# Patient Record
Sex: Female | Born: 1980 | Race: White | Hispanic: No | Smoking: Former smoker
Health system: Southern US, Community
[De-identification: ages and names within clinical notes are randomized; demographics above are authoritative.]

## PROBLEM LIST (undated history)

## (undated) DIAGNOSIS — F319 Bipolar disorder, unspecified: Secondary | ICD-10-CM

## (undated) DIAGNOSIS — T7840XA Allergy, unspecified, initial encounter: Secondary | ICD-10-CM

## (undated) DIAGNOSIS — I209 Angina pectoris, unspecified: Secondary | ICD-10-CM

## (undated) DIAGNOSIS — R0600 Dyspnea, unspecified: Secondary | ICD-10-CM

## (undated) DIAGNOSIS — I499 Cardiac arrhythmia, unspecified: Secondary | ICD-10-CM

## (undated) DIAGNOSIS — F410 Panic disorder [episodic paroxysmal anxiety] without agoraphobia: Secondary | ICD-10-CM

## (undated) DIAGNOSIS — F419 Anxiety disorder, unspecified: Secondary | ICD-10-CM

## (undated) DIAGNOSIS — I10 Essential (primary) hypertension: Secondary | ICD-10-CM

## (undated) DIAGNOSIS — F5089 Other specified eating disorder: Secondary | ICD-10-CM

## (undated) DIAGNOSIS — G43909 Migraine, unspecified, not intractable, without status migrainosus: Secondary | ICD-10-CM

## (undated) DIAGNOSIS — M1990 Unspecified osteoarthritis, unspecified site: Secondary | ICD-10-CM

## (undated) DIAGNOSIS — M5126 Other intervertebral disc displacement, lumbar region: Secondary | ICD-10-CM

## (undated) DIAGNOSIS — F32A Depression, unspecified: Secondary | ICD-10-CM

## (undated) DIAGNOSIS — K219 Gastro-esophageal reflux disease without esophagitis: Secondary | ICD-10-CM

## (undated) DIAGNOSIS — D649 Anemia, unspecified: Secondary | ICD-10-CM

## (undated) HISTORY — DX: Unspecified osteoarthritis, unspecified site: M19.90

## (undated) HISTORY — DX: Allergy, unspecified, initial encounter: T78.40XA

## (undated) HISTORY — DX: Depression, unspecified: F32.A

---

## 2004-09-20 HISTORY — PX: COLPOSCOPY: SHX161

## 2006-10-13 ENCOUNTER — Emergency Department (HOSPITAL_COMMUNITY): Admission: EM | Admit: 2006-10-13 | Discharge: 2006-10-14 | Payer: Self-pay | Admitting: Emergency Medicine

## 2006-10-21 ENCOUNTER — Emergency Department (HOSPITAL_COMMUNITY): Admission: EM | Admit: 2006-10-21 | Discharge: 2006-10-21 | Payer: Self-pay | Admitting: Emergency Medicine

## 2007-09-27 ENCOUNTER — Emergency Department (HOSPITAL_COMMUNITY): Admission: EM | Admit: 2007-09-27 | Discharge: 2007-09-27 | Payer: Self-pay | Admitting: Emergency Medicine

## 2009-01-08 ENCOUNTER — Emergency Department: Payer: Self-pay | Admitting: Emergency Medicine

## 2009-11-06 ENCOUNTER — Emergency Department (HOSPITAL_COMMUNITY): Admission: EM | Admit: 2009-11-06 | Discharge: 2009-11-06 | Payer: Self-pay | Admitting: Emergency Medicine

## 2010-01-04 ENCOUNTER — Emergency Department (HOSPITAL_COMMUNITY): Admission: EM | Admit: 2010-01-04 | Discharge: 2010-01-04 | Payer: Self-pay | Admitting: Emergency Medicine

## 2010-08-20 ENCOUNTER — Emergency Department: Payer: Self-pay | Admitting: Emergency Medicine

## 2010-12-08 LAB — URINALYSIS, ROUTINE W REFLEX MICROSCOPIC
Bilirubin Urine: NEGATIVE
Ketones, ur: NEGATIVE mg/dL
Specific Gravity, Urine: 1.017 (ref 1.005–1.030)
pH: 7.5 (ref 5.0–8.0)

## 2010-12-08 LAB — URINE MICROSCOPIC-ADD ON

## 2011-11-09 ENCOUNTER — Emergency Department (HOSPITAL_COMMUNITY)
Admission: EM | Admit: 2011-11-09 | Discharge: 2011-11-09 | Disposition: A | Discharge status: Home / Self Care | Payer: Self-pay | Attending: Emergency Medicine | Admitting: Emergency Medicine

## 2011-11-09 ENCOUNTER — Encounter (HOSPITAL_COMMUNITY): Payer: Self-pay | Admitting: *Deleted

## 2011-11-09 DIAGNOSIS — S39012A Strain of muscle, fascia and tendon of lower back, initial encounter: Secondary | ICD-10-CM

## 2011-11-09 DIAGNOSIS — X58XXXA Exposure to other specified factors, initial encounter: Secondary | ICD-10-CM | POA: Insufficient documentation

## 2011-11-09 DIAGNOSIS — S339XXA Sprain of unspecified parts of lumbar spine and pelvis, initial encounter: Secondary | ICD-10-CM | POA: Insufficient documentation

## 2011-11-09 DIAGNOSIS — Y9383 Activity, rough housing and horseplay: Secondary | ICD-10-CM | POA: Insufficient documentation

## 2011-11-09 DIAGNOSIS — Y9229 Other specified public building as the place of occurrence of the external cause: Secondary | ICD-10-CM | POA: Insufficient documentation

## 2011-11-09 MED ORDER — CYCLOBENZAPRINE HCL 5 MG PO TABS: 5.0000 mg | ORAL_TABLET | Freq: Three times a day (TID) | ORAL | Status: AC | PRN

## 2011-11-09 NOTE — Discharge Instructions (Signed)
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
## 2011-11-13 NOTE — ED Provider Notes (Signed)
Medical screening examination/treatment/procedure(s) were performed by non-physician practitioner and as supervising physician I was immediately available for consultation/collaboration.   Kash Davie A. Stran Raper, MD 11/13/11 0655 

## 2011-11-20 ENCOUNTER — Encounter (HOSPITAL_COMMUNITY): Payer: Self-pay | Admitting: *Deleted

## 2011-11-20 ENCOUNTER — Emergency Department (HOSPITAL_COMMUNITY)
Admission: EM | Admit: 2011-11-20 | Discharge: 2011-11-21 | Disposition: A | Discharge status: Home / Self Care | Payer: Self-pay | Attending: Emergency Medicine | Admitting: Emergency Medicine

## 2011-11-20 DIAGNOSIS — Z79899 Other long term (current) drug therapy: Secondary | ICD-10-CM | POA: Insufficient documentation

## 2011-11-20 DIAGNOSIS — IMO0001 Reserved for inherently not codable concepts without codable children: Secondary | ICD-10-CM | POA: Insufficient documentation

## 2011-11-20 DIAGNOSIS — IMO0002 Reserved for concepts with insufficient information to code with codable children: Secondary | ICD-10-CM | POA: Insufficient documentation

## 2011-11-20 DIAGNOSIS — F319 Bipolar disorder, unspecified: Secondary | ICD-10-CM | POA: Insufficient documentation

## 2011-11-20 DIAGNOSIS — M2550 Pain in unspecified joint: Secondary | ICD-10-CM | POA: Insufficient documentation

## 2011-11-20 DIAGNOSIS — F411 Generalized anxiety disorder: Secondary | ICD-10-CM | POA: Insufficient documentation

## 2011-11-20 HISTORY — DX: Anxiety disorder, unspecified: F41.9

## 2011-11-20 HISTORY — DX: Panic disorder (episodic paroxysmal anxiety): F41.0

## 2011-11-20 HISTORY — DX: Migraine, unspecified, not intractable, without status migrainosus: G43.909

## 2011-11-20 HISTORY — DX: Bipolar disorder, unspecified: F31.9

## 2011-11-20 LAB — CBC
HCT: 39.2 % (ref 36.0–46.0)
MCH: 22 pg — ABNORMAL LOW (ref 26.0–34.0)
MCV: 69.1 fL — ABNORMAL LOW (ref 78.0–100.0)
Platelets: 343 10*3/uL (ref 150–400)
RDW: 14.8 % (ref 11.5–15.5)

## 2011-11-20 LAB — COMPREHENSIVE METABOLIC PANEL
Alkaline Phosphatase: 75 U/L (ref 39–117)
BUN: 15 mg/dL (ref 6–23)
Calcium: 9.3 mg/dL (ref 8.4–10.5)
GFR calc Af Amer: 76 mL/min — ABNORMAL LOW (ref >90)
Sodium: 137 mEq/L (ref 135–145)

## 2011-11-20 LAB — RAPID URINE DRUG SCREEN, HOSP PERFORMED
Barbiturates: NOT DETECTED
Cocaine: NOT DETECTED
Opiates: NOT DETECTED
Tetrahydrocannabinol: POSITIVE — AB

## 2011-11-20 LAB — ACETAMINOPHEN LEVEL: Acetaminophen (Tylenol), Serum: <15 ug/mL (ref 10–30)

## 2011-11-20 LAB — ETHANOL: Alcohol, Ethyl (B): <11 mg/dL (ref 0–11)

## 2011-11-20 MED ORDER — ACETAMINOPHEN 325 MG PO TABS: 650.0000 mg | ORAL_TABLET | ORAL | Status: DC | PRN

## 2011-11-20 MED ORDER — ONDANSETRON HCL 4 MG PO TABS: 4.0000 mg | ORAL_TABLET | Freq: Three times a day (TID) | ORAL | Status: DC | PRN

## 2011-11-20 MED ORDER — LORAZEPAM 1 MG PO TABS
2.0000 mg | ORAL_TABLET | Freq: Once | ORAL | Status: AC
  Administered 2011-11-20: 2 mg via ORAL
  Filled 2011-11-20: qty 2
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
## 2011-11-20 NOTE — ED Notes (Signed)
2 bags locked in locker 
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
## 2011-11-21 NOTE — ED Provider Notes (Signed)
Patient presented tonight with history of bipolar disorder and has been having uncontrollable outbursts of anger that she feels is due to her recent increase in Prozac. She has been evaluated by Dr. Jacky Kindle tele-psych consult and he feels patient can be discharged. He recommends that she discussed her dose of Prozac with her psychiatrist. He also advised she continues on her other medications.  Patient is alert and cooperative at this point she states she is ready to go home. She states she feels comfortable and assures me she's not to do anything to herself or anybody else.  Diagnoses that have been ruled out:  None  Diagnoses that are still under consideration:  None  Final diagnoses:  Bipolar disorder   Plan discharge    Devoria Albe, MD, Franz Dell, MD 11/21/11 [phone]

## 2011-11-21 NOTE — BH Assessment (Signed)
Per telepsych pt to be discharged. RN and EDP notified.

## 2011-11-21 NOTE — Discharge Instructions (Signed)
Continue your medication, Dr Jacky Kindle the psychiatrist that saw you wants you to discuss the dose of your prozac with your psychiatrist. Return to the ED if you feel you may hurt yourself or someone else.

## 2012-06-21 DIAGNOSIS — Z87410 Personal history of cervical dysplasia: Secondary | ICD-10-CM | POA: Insufficient documentation

## 2012-12-10 ENCOUNTER — Emergency Department: Payer: Self-pay | Admitting: Emergency Medicine

## 2014-02-23 ENCOUNTER — Emergency Department: Payer: Self-pay | Admitting: Emergency Medicine

## 2014-02-23 LAB — COMPREHENSIVE METABOLIC PANEL
ANION GAP: 7 (ref 7–16)
Albumin: 3.4 g/dL (ref 3.4–5.0)
Alkaline Phosphatase: 68 U/L
BILIRUBIN TOTAL: 0.2 mg/dL (ref 0.2–1.0)
BUN: 11 mg/dL (ref 7–18)
CALCIUM: 8.7 mg/dL (ref 8.5–10.1)
CO2: 23 mmol/L (ref 21–32)
Chloride: 108 mmol/L — ABNORMAL HIGH (ref 98–107)
Creatinine: 0.87 mg/dL (ref 0.60–1.30)
EGFR (African American): >60
Glucose: 100 mg/dL — ABNORMAL HIGH (ref 65–99)
Osmolality: 275 (ref 275–301)
POTASSIUM: 3.5 mmol/L (ref 3.5–5.1)
SGOT(AST): 17 U/L (ref 15–37)
SGPT (ALT): 16 U/L (ref 12–78)
Sodium: 138 mmol/L (ref 136–145)
TOTAL PROTEIN: 7.2 g/dL (ref 6.4–8.2)

## 2014-02-23 LAB — TROPONIN I: Troponin-I: <0.02 ng/mL

## 2014-02-23 LAB — CBC
HCT: 35.9 % (ref 35.0–47.0)
HGB: 11 g/dL — AB (ref 12.0–16.0)
MCH: 21.2 pg — ABNORMAL LOW (ref 26.0–34.0)
MCHC: 30.6 g/dL — ABNORMAL LOW (ref 32.0–36.0)
MCV: 69 fL — AB (ref 80–100)
Platelet: 364 10*3/uL (ref 150–440)
RBC: 5.19 10*6/uL (ref 3.80–5.20)
RDW: 15.2 % — AB (ref 11.5–14.5)
WBC: 11.6 10*3/uL — AB (ref 3.6–11.0)

## 2014-02-23 LAB — CK TOTAL AND CKMB (NOT AT ARMC)
CK, Total: 128 U/L
CK-MB: 1 ng/mL (ref 0.5–3.6)

## 2014-05-10 ENCOUNTER — Emergency Department: Payer: Self-pay | Admitting: Emergency Medicine

## 2014-05-10 LAB — URINALYSIS, COMPLETE
BILIRUBIN, UR: NEGATIVE
Bacteria: NONE SEEN
GLUCOSE, UR: NEGATIVE mg/dL (ref 0–75)
Hyaline Cast: 2
KETONE: NEGATIVE
Nitrite: NEGATIVE
PH: 5 (ref 4.5–8.0)
RBC,UR: 479 /HPF (ref 0–5)
SPECIFIC GRAVITY: 1.026 (ref 1.003–1.030)

## 2014-05-10 LAB — COMPREHENSIVE METABOLIC PANEL
ALK PHOS: 68 U/L
AST: 23 U/L (ref 15–37)
Albumin: 3.6 g/dL (ref 3.4–5.0)
Anion Gap: 9 (ref 7–16)
BILIRUBIN TOTAL: 0.3 mg/dL (ref 0.2–1.0)
BUN: 14 mg/dL (ref 7–18)
CALCIUM: 8.8 mg/dL (ref 8.5–10.1)
Chloride: 108 mmol/L — ABNORMAL HIGH (ref 98–107)
Co2: 23 mmol/L (ref 21–32)
Creatinine: 1.1 mg/dL (ref 0.60–1.30)
GLUCOSE: 109 mg/dL — AB (ref 65–99)
Osmolality: 280 (ref 275–301)
Potassium: 3.1 mmol/L — ABNORMAL LOW (ref 3.5–5.1)
SGPT (ALT): 16 U/L
SODIUM: 140 mmol/L (ref 136–145)
Total Protein: 7.8 g/dL (ref 6.4–8.2)

## 2014-05-10 LAB — CBC
HCT: 36.2 % (ref 35.0–47.0)
HGB: 11.1 g/dL — AB (ref 12.0–16.0)
MCH: 21.5 pg — AB (ref 26.0–34.0)
MCHC: 30.6 g/dL — AB (ref 32.0–36.0)
MCV: 70 fL — ABNORMAL LOW (ref 80–100)
Platelet: 395 10*3/uL (ref 150–440)
RBC: 5.17 10*6/uL (ref 3.80–5.20)
RDW: 14.5 % (ref 11.5–14.5)
WBC: 13.4 10*3/uL — AB (ref 3.6–11.0)

## 2014-05-16 ENCOUNTER — Emergency Department: Payer: Self-pay | Admitting: Emergency Medicine

## 2014-06-27 ENCOUNTER — Emergency Department: Payer: Self-pay | Admitting: Emergency Medicine

## 2014-07-09 ENCOUNTER — Emergency Department: Payer: Self-pay | Admitting: Emergency Medicine

## 2014-09-10 ENCOUNTER — Ambulatory Visit: Payer: Self-pay | Admitting: Family Medicine

## 2014-09-20 ENCOUNTER — Emergency Department: Payer: Self-pay | Admitting: Emergency Medicine

## 2014-09-30 DIAGNOSIS — F3181 Bipolar II disorder: Secondary | ICD-10-CM | POA: Insufficient documentation

## 2015-02-24 DIAGNOSIS — Z3490 Encounter for supervision of normal pregnancy, unspecified, unspecified trimester: Secondary | ICD-10-CM | POA: Insufficient documentation

## 2015-03-04 DIAGNOSIS — O9081 Anemia of the puerperium: Secondary | ICD-10-CM | POA: Insufficient documentation

## 2015-03-13 DIAGNOSIS — F1290 Cannabis use, unspecified, uncomplicated: Secondary | ICD-10-CM | POA: Insufficient documentation

## 2015-05-21 DIAGNOSIS — R87612 Low grade squamous intraepithelial lesion on cytologic smear of cervix (LGSIL): Secondary | ICD-10-CM | POA: Insufficient documentation

## 2015-12-30 ENCOUNTER — Emergency Department
Admission: EM | Admit: 2015-12-30 | Discharge: 2015-12-30 | Disposition: A | Discharge status: Home / Self Care | Payer: Medicaid Other | Attending: Emergency Medicine | Admitting: Emergency Medicine

## 2015-12-30 ENCOUNTER — Encounter: Payer: Self-pay | Admitting: Medical Oncology

## 2015-12-30 ENCOUNTER — Emergency Department: Payer: Medicaid Other

## 2015-12-30 DIAGNOSIS — M25562 Pain in left knee: Secondary | ICD-10-CM | POA: Diagnosis present

## 2015-12-30 DIAGNOSIS — F410 Panic disorder [episodic paroxysmal anxiety] without agoraphobia: Secondary | ICD-10-CM | POA: Insufficient documentation

## 2015-12-30 DIAGNOSIS — F1099 Alcohol use, unspecified with unspecified alcohol-induced disorder: Secondary | ICD-10-CM | POA: Insufficient documentation

## 2015-12-30 DIAGNOSIS — G43909 Migraine, unspecified, not intractable, without status migrainosus: Secondary | ICD-10-CM | POA: Diagnosis not present

## 2015-12-30 DIAGNOSIS — F1490 Cocaine use, unspecified, uncomplicated: Secondary | ICD-10-CM | POA: Insufficient documentation

## 2015-12-30 DIAGNOSIS — F418 Other specified anxiety disorders: Secondary | ICD-10-CM | POA: Insufficient documentation

## 2015-12-30 DIAGNOSIS — Z79899 Other long term (current) drug therapy: Secondary | ICD-10-CM | POA: Diagnosis not present

## 2015-12-30 MED ORDER — KETOROLAC TROMETHAMINE 60 MG/2ML IM SOLN
60.0000 mg | Freq: Once | INTRAMUSCULAR | Status: AC
  Administered 2015-12-30: 60 mg via INTRAMUSCULAR
  Filled 2015-12-30: qty 2

## 2015-12-30 MED ORDER — HYDROCODONE-ACETAMINOPHEN 5-325 MG PO TABS: 1.0000 | ORAL_TABLET | ORAL | Status: DC | PRN

## 2015-12-30 MED ORDER — NAPROXEN 500 MG PO TABS: 500.0000 mg | ORAL_TABLET | Freq: Two times a day (BID) | ORAL | Status: DC

## 2015-12-30 NOTE — ED Notes (Signed)
Pt reports left knee pain x 1 month.

## 2015-12-30 NOTE — Discharge Instructions (Signed)
Heat Therapy Heat therapy can help ease sore, stiff, injured, and tight muscles and joints. Heat relaxes your muscles, which may help ease your pain.  RISKS AND COMPLICATIONS If you have any of the following conditions, do not use heat therapy unless your health care provider has approved:  Poor circulation.  Healing wounds or scarred skin in the area being treated.  Diabetes, heart disease, or high blood pressure.  Not being able to feel (numbness) the area being treated.  Unusual swelling of the area being treated.  Active infections.  Blood clots.  Cancer.  Inability to communicate pain. This may include young children and people who have problems with their brain function (dementia).  Pregnancy. Heat therapy should only be used on old, pre-existing, or long-lasting (chronic) injuries. Do not use heat therapy on new injuries unless directed by your health care provider. HOW TO USE HEAT THERAPY There are several different kinds of heat therapy, including:  Moist heat pack.  Warm water bath.  Hot water bottle.  Electric heating pad.  Heated gel pack.  Heated wrap.  Electric heating pad. Use the heat therapy method suggested by your health care provider. Follow your health care provider's instructions on when and how to use heat therapy. GENERAL HEAT THERAPY RECOMMENDATIONS  Do not sleep while using heat therapy. Only use heat therapy while you are awake.  Your skin may turn pink while using heat therapy. Do not use heat therapy if your skin turns red.  Do not use heat therapy if you have new pain.  High heat or long exposure to heat can cause burns. Be careful when using heat therapy to avoid burning your skin.  Do not use heat therapy on areas of your skin that are already irritated, such as with a rash or sunburn. SEEK MEDICAL CARE IF:  You have blisters, redness, swelling, or numbness.  You have new pain.  Your pain is worse. MAKE SURE  YOU:  Understand these instructions.  Will watch your condition.  Will get help right away if you are not doing well or get worse.   This information is not intended to replace advice given to you by your health care provider. Make sure you discuss any questions you have with your health care provider.   Document Released: 11/29/2011 Document Revised: 09/27/2014 Document Reviewed: 10/30/2013 Elsevier Interactive Patient Education 2016 [place] [employer] Joint pain, which is also called arthralgia, can be caused by many things. Joint pain often goes away when you follow your health care provider's instructions for relieving pain at home. However, joint pain can also be caused by conditions that require further treatment. Common causes of joint pain include:  Bruising in the area of the joint.  Overuse of the joint.  Wear and tear on the joints that occur with aging (osteoarthritis).  Various other forms of arthritis.  A buildup of a crystal form of uric acid in the joint (gout).  Infections of the joint (septic arthritis) or of the bone (osteomyelitis). Your health care provider may recommend medicine to help with the pain. If your joint pain continues, additional tests may be needed to diagnose your condition. HOME CARE INSTRUCTIONS Watch your condition for any changes. Follow these instructions as directed to lessen the pain that you are feeling.  Take medicines only as directed by your health care provider.  Rest the affected area for as long as your health care provider says that you should. If directed to do so, raise  the painful joint above the level of your heart while you are sitting or lying down.  Do not do things that cause or worsen pain.  If directed, apply ice to the painful area:  Put ice in a plastic bag.  Place a towel between your skin and the bag.  Leave the ice on for 20 minutes, 2-3 times per day.  Wear an elastic bandage, splint, or sling as  directed by your health care provider. Loosen the elastic bandage or splint if your fingers or toes become numb and tingle, or if they turn cold and blue.  Begin exercising or stretching the affected area as directed by your health care provider. Ask your health care provider what types of exercise are safe for you.  Keep all follow-up visits as directed by your health care provider. This is important. SEEK MEDICAL CARE IF:  Your pain increases, and medicine does not help.  Your joint pain does not improve within 3 days.  You have increased bruising or swelling.  You have a fever.  You lose 10 lb (4.5 kg) or more without trying. SEEK IMMEDIATE MEDICAL CARE IF:  You are not able to move the joint.  Your fingers or toes become numb or they turn cold and blue.   This information is not intended to replace advice given to you by your health care provider. Make sure you discuss any questions you have with your health care provider.   Document Released: 09/06/2005 Document Revised: 09/27/2014 Document Reviewed: 06/18/2014 Elsevier Interactive Patient Education 2016 Else[employer]  Knee Pain Knee pain is a very common symptom and can have many causes. Knee pain often goes away when you follow your health care provider's instructions for relieving pain and discomfort at home. However, knee pain can develop into a condition that needs treatment. Some conditions may include:  Arthritis caused by wear and tear (osteoarthritis).  Arthritis caused by swelling and irritation (rheumatoid arthritis or gout).  A cyst or growth in your knee.  An infection in your knee joint.  An injury that will not heal.  Damage, swelling, or irritation of the tissues that support your knee (torn ligaments or tendinitis). If your knee pain continues, additional tests may be ordered to diagnose your condition. Tests may include X-rays or other imaging studies of your knee. You may also need to have fluid  removed from your knee. Treatment for ongoing knee pain depends on the cause, but treatment may include:  Medicines to relieve pain or swelling.  Steroid injections in your knee.  Physical therapy.  Surgery. HOME CARE INSTRUCTIONS  Take medicines only as directed by your health care provider.  Rest your knee and keep it raised (elevated) while you are resting.  Do not do things that cause or worsen pain.  Avoid high-impact activities or exercises, such as running, jumping rope, or doing jumping jacks.  Apply ice to the knee area:  Put ice in a plastic bag.  Place a towel between your skin and the bag.  Leave the ice on for 20 minutes, 2-3 times a day.  Ask your health care provider if you should wear an elastic knee support.  Keep a pillow under your knee when you sleep.  Lose weight if you are overweight. Extra weight can put pressure on your knee.  Do not use any tobacco products, including cigarettes, chewing tobacco, or electronic cigarettes. If you need help quitting, ask your health care provider. Smoking may slow the healing of any  bone and joint problems that you may have. SEEK MEDICAL CARE IF:  Your knee pain continues, changes, or gets worse.  You have a fever along with knee pain.  Your knee buckles or locks up.  Your knee becomes more swollen. SEEK IMMEDIATE MEDICAL CARE IF:   Your knee joint feels hot to the touch.  You have chest pain or trouble breathing.   This information is not intended to replace advice given to you by your health care provider. Make sure you discuss any questions you have with your health care provider.   Document Released: 07/04/2007 Document Revised: 09/27/2014 Document Reviewed: 04/22/2014 Elsevier Interactive Patient Education [employer].

## 2015-12-30 NOTE — ED Notes (Signed)
Left knee pain for about 1 month   Unsure of injury

## 2015-12-30 NOTE — ED Provider Notes (Signed)
[facility] Emergency Department Provider Note  ____________________________________________  Time seen: Approximately 2:01 PM  I have reviewed the triage vital signs and the nursing notes.   HISTORY  Chief Complaint Knee Pain    HPI Dominique Dyer is a 35 y.o. female presents for evaluation of left knee pain 1 month. Unsure of injury. Patient states over the last couple days the pain is progressively gotten worsen. Patient reports trying to get out of the car pretty weight on her knee causes increased pain. Husband states that she cries out during her sleep because of the pain. Feel like someone is stabbing her in the upper part of her knee. Rates her pain as an 8/10 nonradiating at this time.   Past Medical History  Diagnosis Date  . Bipolar disorder ([place])   . Panic attacks   . Migraines   . Anxiety     There are no active problems to display for this patient.   History reviewed. No pertinent past surgical history.  Current Outpatient Rx  Name  Route  Sig  Dispense  Refill  . cetirizine (ZYRTEC) 10 MG tablet   Oral   Take 10 mg by mouth daily.         Marland Kitchen FLUoxetine (PROZAC) 20 MG capsule   Oral   Take 40 mg by mouth daily.         Marland Kitchen HYDROcodone-acetaminophen (NORCO) 5-325 MG tablet   Oral   Take 1-2 tablets by mouth every 4 (four) hours as needed for moderate pain.   15 tablet   0   . lamoTRIgine (LAMICTAL) 100 MG tablet   Oral   Take 100 mg by mouth daily.         . naproxen (NAPROSYN) 500 MG tablet   Oral   Take 1 tablet (500 mg total) by mouth 2 (two) times daily with a meal.   60 tablet   0   . propranolol (INDERAL) 40 MG tablet   Oral   Take 40 mg by mouth 2 (two) times daily.         . ranitidine (ZANTAC) 150 MG tablet   Oral   Take 150 mg by mouth 2 (two) times daily.         Marland Kitchen topiramate (TOPAMAX) 25 MG tablet   Oral   Take 25 mg by mouth 2 (two) times daily.         Marland Kitchen zolpidem (AMBIEN) 10 MG  tablet   Oral   Take 10 mg by mouth at bedtime as needed. For anxiety           Allergies Review of patient's allergies indicates no known allergies.  No family history on file.  Social History Social History  Substance Use Topics  . Smoking status: Never Smoker   . Smokeless tobacco: None  . Alcohol Use: Yes     Comment: occa, but recently been drinking Bailey's     Review of Systems Cardiovascular: Denies chest pain. Respiratory: Denies shortness of breath. Musculoskeletal: Positive for left knee pain. Skin: Negative for rash. Neurological: Negative for headaches, focal weakness or numbness.  10-point ROS otherwise negative.  ____________________________________________   PHYSICAL EXAM: BP 137/79 mmHg  Pulse 89  Temp(Src) 98.4 F (36.9 C) (Oral)  Resp 15  Ht 5\' 4"  (1.626 m)  Wt 106.595 kg  BMI 40.32 kg/m2  SpO2 100%  LMP 12/16/2015 (Within Weeks)  VITAL SIGNS: ED Triage Vitals  Enc Vitals Group  BP --      Pulse --      Resp --      Temp --      Temp src --      SpO2 --      Weight --      Height --      Head Cir --      Peak Flow --      Pain Score 12/30/15 1353 8     Pain Loc --      Pain Edu? --      Excl. in [place]? --     Constitutional: Alert and oriented. Well appearing and in no acute distress.   Cardiovascular: Normal rate, regular rhythm. Grossly normal heart sounds.  Good peripheral circulation. Respiratory: Normal respiratory effort.  No retractions. Lungs CTAB. Musculoskeletal: Left knee pain. No ecchymosis or bruising noted. Limited range of motion with flexion. Point tenderness noted superiorly and medially. No obvious evidence of effusion noted. Neurologic:  Normal speech and language. No gross focal neurologic deficits are appreciated. No gait instability. Skin:  Skin is warm, dry and intact. No rash noted. Psychiatric: Mood and affect are normal. Speech and behavior are normal.  ____________________________________________    LABS (all labs ordered are listed, but only abnormal results are displayed)  Labs Reviewed - No data to display   RADIOLOGY  Small left knee effusion. No acute osseous findings. ____________________________________________   PROCEDURES  Procedure(s) performed: None  Critical Care performed: No  ____________________________________________   INITIAL IMPRESSION / ASSESSMENT AND PLAN / ED COURSE  Pertinent labs & imaging results that were available during my care of the patient were reviewed by me and considered in my medical decision making (see chart for details).  Acute left knee pain. Small joint effusion noted. Patient follow-up with Dr. Roland Rack or return to the ER as needed. Patient given a prescription for Norco 5/325 and naproxen 500 mg. Patient encouraged to stay off for the next 24 and elevate her knee. She denies any other emergency medical complaints at this time. ____________________________________________   FINAL CLINICAL IMPRESSION(S) / ED DIAGNOSES  Final diagnoses:  Knee pain, acute, left     This chart was dictated using voice recognition software/Dragon. Despite best efforts to proofread, errors can occur which can change the meaning. Any change was purely unintentional.   Arlyss Repress, PA-C 12/30/15 1758  Lavonia Drafts, MD 01/02/16 [phone]

## 2016-02-01 DIAGNOSIS — R519 Headache, unspecified: Secondary | ICD-10-CM | POA: Insufficient documentation

## 2017-01-18 ENCOUNTER — Other Ambulatory Visit: Payer: Self-pay | Admitting: Nurse Practitioner

## 2017-01-18 DIAGNOSIS — G43719 Chronic migraine without aura, intractable, without status migrainosus: Secondary | ICD-10-CM

## 2017-01-21 DIAGNOSIS — G43719 Chronic migraine without aura, intractable, without status migrainosus: Secondary | ICD-10-CM | POA: Insufficient documentation

## 2017-02-02 ENCOUNTER — Ambulatory Visit: Payer: Medicaid Other

## 2017-02-09 ENCOUNTER — Ambulatory Visit
Admission: RE | Admit: 2017-02-09 | Discharge: 2017-02-09 | Disposition: A | Discharge status: Home / Self Care | Payer: Medicaid Other | Source: Ambulatory Visit | Attending: Nurse Practitioner | Admitting: Nurse Practitioner

## 2017-02-09 DIAGNOSIS — G43719 Chronic migraine without aura, intractable, without status migrainosus: Secondary | ICD-10-CM | POA: Diagnosis present

## 2017-02-09 MED ORDER — GADOBENATE DIMEGLUMINE 529 MG/ML IV SOLN
20.0000 mL | Freq: Once | INTRAVENOUS | Status: AC | PRN
  Administered 2017-02-09: 20 mL via INTRAVENOUS

## 2017-05-18 ENCOUNTER — Ambulatory Visit: Payer: Medicaid Other | Attending: Neurology

## 2017-06-17 ENCOUNTER — Ambulatory Visit: Payer: Medicaid Other | Attending: Neurology

## 2017-06-20 ENCOUNTER — Encounter: Payer: Self-pay | Admitting: Emergency Medicine

## 2017-06-20 DIAGNOSIS — R1031 Right lower quadrant pain: Secondary | ICD-10-CM | POA: Diagnosis present

## 2017-06-20 DIAGNOSIS — Z79899 Other long term (current) drug therapy: Secondary | ICD-10-CM | POA: Diagnosis not present

## 2017-06-20 LAB — COMPREHENSIVE METABOLIC PANEL
ALT: 12 U/L — AB (ref 14–54)
AST: 14 U/L — ABNORMAL LOW (ref 15–41)
Albumin: 4 g/dL (ref 3.5–5.0)
Alkaline Phosphatase: 64 U/L (ref 38–126)
Anion gap: 7 (ref 5–15)
BUN: 8 mg/dL (ref 6–20)
CO2: 25 mmol/L (ref 22–32)
CREATININE: 0.86 mg/dL (ref 0.44–1.00)
Calcium: 8.7 mg/dL — ABNORMAL LOW (ref 8.9–10.3)
Chloride: 106 mmol/L (ref 101–111)
Glucose, Bld: 100 mg/dL — ABNORMAL HIGH (ref 65–99)
Potassium: 3.7 mmol/L (ref 3.5–5.1)
Sodium: 138 mmol/L (ref 135–145)
Total Bilirubin: 0.2 mg/dL — ABNORMAL LOW (ref 0.3–1.2)
Total Protein: 7.3 g/dL (ref 6.5–8.1)

## 2017-06-20 LAB — URINALYSIS, COMPLETE (UACMP) WITH MICROSCOPIC
BILIRUBIN URINE: NEGATIVE
Glucose, UA: NEGATIVE mg/dL
Ketones, ur: NEGATIVE mg/dL
LEUKOCYTES UA: NEGATIVE
Nitrite: NEGATIVE
PROTEIN: NEGATIVE mg/dL
SPECIFIC GRAVITY, URINE: 1.005 (ref 1.005–1.030)
pH: 6 (ref 5.0–8.0)

## 2017-06-20 LAB — CBC
HCT: 31.4 % — ABNORMAL LOW (ref 35.0–47.0)
Hemoglobin: 9.7 g/dL — ABNORMAL LOW (ref 12.0–16.0)
MCH: 20.2 pg — ABNORMAL LOW (ref 26.0–34.0)
MCHC: 31 g/dL — ABNORMAL LOW (ref 32.0–36.0)
MCV: 65.2 fL — ABNORMAL LOW (ref 80.0–100.0)
PLATELETS: 383 10*3/uL (ref 150–440)
RBC: 4.81 MIL/uL (ref 3.80–5.20)
RDW: 17.1 % — ABNORMAL HIGH (ref 11.5–14.5)
WBC: 8.6 10*3/uL (ref 3.6–11.0)

## 2017-06-20 LAB — LIPASE, BLOOD: LIPASE: 27 U/L (ref 11–51)

## 2017-06-20 LAB — POCT PREGNANCY, URINE: Preg Test, Ur: NEGATIVE

## 2017-06-20 MED ORDER — ONDANSETRON 4 MG PO TBDP
4.0000 mg | ORAL_TABLET | Freq: Once | ORAL | Status: AC | PRN
  Administered 2017-06-20: 4 mg via ORAL
  Filled 2017-06-20: qty 1

## 2017-06-20 NOTE — ED Triage Notes (Addendum)
Patient to ER for c/o RLQ abd pain/pelvic pain. Patient states she was seen on Thursday by PCP and treated for UTI. Has been on Macrobid since. States pain is now starting to go into right lower back as well. Denies any known fevers. Reports slight dysuria, but has improved since taking Macrobid. States pain worsens with movement. +Nausea.

## 2017-06-21 ENCOUNTER — Emergency Department
Admission: EM | Admit: 2017-06-21 | Discharge: 2017-06-21 | Disposition: A | Discharge status: Home / Self Care | Payer: Medicaid Other | Attending: Emergency Medicine | Admitting: Emergency Medicine

## 2017-06-21 ENCOUNTER — Encounter: Payer: Self-pay | Admitting: Radiology

## 2017-06-21 ENCOUNTER — Emergency Department: Payer: Medicaid Other

## 2017-06-21 DIAGNOSIS — R1031 Right lower quadrant pain: Secondary | ICD-10-CM

## 2017-06-21 LAB — WET PREP, GENITAL
Clue Cells Wet Prep HPF POC: NONE SEEN
SPERM: NONE SEEN
TRICH WET PREP: NONE SEEN
YEAST WET PREP: NONE SEEN

## 2017-06-21 LAB — CHLAMYDIA/NGC RT PCR (ARMC ONLY)
Chlamydia Tr: NOT DETECTED
N gonorrhoeae: NOT DETECTED

## 2017-06-21 MED ORDER — MORPHINE SULFATE (PF) 4 MG/ML IV SOLN
4.0000 mg | Freq: Once | INTRAVENOUS | Status: AC
  Administered 2017-06-21: 4 mg via INTRAVENOUS
  Filled 2017-06-21: qty 1

## 2017-06-21 MED ORDER — IOPAMIDOL (ISOVUE-300) INJECTION 61%
100.0000 mL | Freq: Once | INTRAVENOUS | Status: AC | PRN
  Administered 2017-06-21: 100 mL via INTRAVENOUS

## 2017-06-21 MED ORDER — KETOROLAC TROMETHAMINE 30 MG/ML IJ SOLN
30.0000 mg | Freq: Once | INTRAMUSCULAR | Status: AC
  Administered 2017-06-21: 30 mg via INTRAVENOUS
  Filled 2017-06-21: qty 1

## 2017-06-21 MED ORDER — IOPAMIDOL (ISOVUE-300) INJECTION 61%
30.0000 mL | Freq: Once | INTRAVENOUS | Status: AC
Start: 2017-06-21 — End: 2017-06-21
  Administered 2017-06-21: 30 mL via ORAL

## 2017-06-21 MED ORDER — ONDANSETRON HCL 4 MG/2ML IJ SOLN
4.0000 mg | Freq: Once | INTRAMUSCULAR | Status: AC
  Administered 2017-06-21: 4 mg via INTRAVENOUS
  Filled 2017-06-21: qty 2

## 2017-06-21 NOTE — ED Notes (Signed)
Pelvic exam done.  Specimen to lab  Pt tolerated well.

## 2017-06-21 NOTE — ED Notes (Signed)
Iv started  meds given.   

## 2017-06-21 NOTE — Discharge Instructions (Signed)
Your evaluation today included a CT scan and blood work which was negative. Please follow up with your primary care physician

## 2017-06-21 NOTE — ED Notes (Addendum)
Pt has pain in right lower abdomen.  Constant pain    Pt also reports nausea.  Vomited x1.  Diarrhea x 4.  Recent uti.   pt alert.

## 2017-06-21 NOTE — ED Notes (Signed)
Report off to rachel rn  

## 2017-06-21 NOTE — ED Provider Notes (Signed)
[facility] Emergency Department Provider Note   ____________________________________________   First MD Initiated Contact with Patient 06/21/17 [phone]     (approximate)  I have reviewed the triage vital signs and the nursing notes.   HISTORY  Chief Complaint Abdominal Pain    HPI Dominique Dyer is a 36 y.o. female who came into the hospital today with some right lower quadrant pain. The patient states it started yesterday. She reports that it is been a constant pain and has been hurting more and more. Is worse when she moves. The patient states that it also is worse when she twists or cough. The patient has had some nausea and one episode of vomiting. She is not taking anything for pain at home. The patient also had some diarrhea, 4 episodes. She reports that she's had no fevers and her pain is currently a 7 out of 10 in intensity. The patient denies any vaginal discharge and she reports that she is currently at the end of her period. The patient states that she recently started medications for urinary tract infection on Thursday. The patient is here today for evaluation.   Past Medical History:  Diagnosis Date  . Anxiety   . Bipolar disorder ([place])   . Migraines   . Panic attacks     There are no active problems to display for this patient.   History reviewed. No pertinent surgical history.  Prior to Admission medications   Medication Sig Start Date End Date Taking? Authorizing Provider  cetirizine (ZYRTEC) 10 MG tablet Take 10 mg by mouth daily.    [provider]  FLUoxetine (PROZAC) 20 MG capsule Take 40 mg by mouth daily.    [provider]  HYDROcodone-acetaminophen (NORCO) 5-325 MG tablet Take 1-2 tablets by mouth every 4 (four) hours as needed for moderate pain. 12/30/15   Beers, Pierce Crane, PA-C  lamoTRIgine (LAMICTAL) 100 MG tablet Take 100 mg by mouth daily.    [provider]  naproxen (NAPROSYN) 500 MG tablet  Take 1 tablet (500 mg total) by mouth 2 (two) times daily with a meal. 12/30/15   Beers, Pierce Crane, PA-C  propranolol (INDERAL) 40 MG tablet Take 40 mg by mouth 2 (two) times daily.    [provider]  ranitidine (ZANTAC) 150 MG tablet Take 150 mg by mouth 2 (two) times daily.    [provider]  topiramate (TOPAMAX) 25 MG tablet Take 25 mg by mouth 2 (two) times daily.    [provider]  zolpidem (AMBIEN) 10 MG tablet Take 10 mg by mouth at bedtime as needed. For anxiety    [provider]    Allergies Patient has no known allergies.  No family history on file.  Social History Social History  Substance Use Topics  . Smoking status: Never Smoker  . Smokeless tobacco: Never Used  . Alcohol use No     Comment: occa, but recently been drinking Bailey's     Review of Systems  Constitutional: No fever/chills Eyes: No visual changes. ENT: No sore throat. Cardiovascular: Denies chest pain. Respiratory: Denies shortness of breath. Gastrointestinal:  abdominal pain.  nausea,  Vomiting, diarrhea.  No constipation. Genitourinary: Negative for dysuria. Musculoskeletal: Negative for back pain. Skin: Negative for rash. Neurological: Negative for headaches, focal weakness or numbness.   ____________________________________________   PHYSICAL EXAM:  VITAL SIGNS: ED Triage Vitals  Enc Vitals Group     BP 06/20/17 2211 128/89  Pulse Rate 06/20/17 2211 73     Resp 06/21/17 0346 20     Temp 06/20/17 2211 98.5 F (36.9 C)     Temp Source 06/20/17 2211 Oral     SpO2 06/20/17 2211 99 %     Weight 06/20/17 2211 203 lb 5 oz (92.2 kg)     Height 06/20/17 2211 5\' 4"  (1.626 m)     Head Circumference --      Peak Flow --      Pain Score 06/20/17 2211 7     Pain Loc --      Pain Edu? --      Excl. in [place]? --     Constitutional: Alert and oriented. Well appearing and in moderatedistress. Eyes: Conjunctivae are normal. PERRL. EOMI. Head:  Atraumatic. Nose: No congestion/rhinnorhea. Mouth/Throat: Mucous membranes are moist.  Oropharynx non-erythematous. Cardiovascular: Normal rate, regular rhythm. Grossly normal heart sounds.  Good peripheral circulation. Respiratory: Normal respiratory effort.  No retractions. Lungs CTAB. Gastrointestinal: Soft with some right lower quadrant tenderness to palpation. No distention. positive bowel sounds Genitourinary: normal external genitalia, no significant discharge, mild blood in vaginal vault, mild right adnexal tenderness to palpation but pain is higher in the right lower quadrant. Musculoskeletal: No lower extremity tenderness nor edema.   Neurologic:  Normal speech and language.  Skin:  Skin is warm, dry and intact.  Psychiatric: Mood and affect are normal.   ____________________________________________   LABS (all labs ordered are listed, but only abnormal results are displayed)  Labs Reviewed  WET PREP, GENITAL - Abnormal; Notable for the following:       Result Value   WBC, Wet Prep HPF POC FEW (*)    All other components within normal limits  COMPREHENSIVE METABOLIC PANEL - Abnormal; Notable for the following:    Glucose, Bld 100 (*)    Calcium 8.7 (*)    AST 14 (*)    ALT 12 (*)    Total Bilirubin 0.2 (*)    All other components within normal limits  CBC - Abnormal; Notable for the following:    Hemoglobin 9.7 (*)    HCT 31.4 (*)    MCV 65.2 (*)    MCH 20.2 (*)    MCHC 31.0 (*)    RDW 17.1 (*)    All other components within normal limits  URINALYSIS, COMPLETE (UACMP) WITH MICROSCOPIC - Abnormal; Notable for the following:    Color, Urine YELLOW (*)    APPearance CLEAR (*)    Hgb urine dipstick MODERATE (*)    Bacteria, UA RARE (*)    Squamous Epithelial / LPF 0-5 (*)    All other components within normal limits  CHLAMYDIA/NGC RT PCR ([facility] ONLY)  LIPASE, BLOOD  POC URINE PREG, ED  POCT PREGNANCY, URINE    ____________________________________________  EKG  none ____________________________________________  RADIOLOGY  Ct Abdomen Pelvis W Contrast  Result Date: 06/21/2017 CLINICAL DATA:  Right lower quadrant abdominal pain and pelvic pain. Patient was seen on Thursday by primary care physician and treated for urinary tract infection. Pain continues. Slight dysuria. Nausea. EXAM: CT ABDOMEN AND PELVIS WITH CONTRAST TECHNIQUE: Multidetector CT imaging of the abdomen and pelvis was performed using the standard protocol following bolus administration of intravenous contrast. CONTRAST:  187mL ISOVUE-300 IOPAMIDOL (ISOVUE-300) INJECTION 61% COMPARISON:  None. FINDINGS: Lower chest: The lung bases are clear. Hepatobiliary: No focal liver abnormality is seen. No gallstones, gallbladder wall thickening, or biliary dilatation. Pancreas: Unremarkable. No pancreatic ductal dilatation or  surrounding inflammatory changes. Spleen: Normal in size without focal abnormality. Adrenals/Urinary Tract: Adrenal glands are unremarkable. Kidneys are normal, without renal calculi, focal lesion, or hydronephrosis. Bladder is unremarkable. Stomach/Bowel: Stomach is within normal limits. Appendix appears normal. No evidence of bowel wall thickening, distention, or inflammatory changes. Vascular/Lymphatic: No significant vascular findings are present. No enlarged abdominal or pelvic lymph nodes. Reproductive: Uterus and bilateral adnexa are unremarkable. Other: No abdominal wall hernia or abnormality. No abdominopelvic ascites. Musculoskeletal: No acute or significant osseous findings. IMPRESSION: No acute process demonstrated in the abdomen or pelvis. No evidence of bowel obstruction or inflammation. Appendix is normal. No evidence of ureteral obstruction. Electronically Signed   By: Lucienne Capers M.D.   On: 06/21/2017 02:59    ____________________________________________   PROCEDURES  Procedure(s) performed:  None  Procedures  Critical Care performed: No  ____________________________________________   INITIAL IMPRESSION / ASSESSMENT AND PLAN / ED COURSE  Pertinent labs & imaging results that were available during my care of the patient were reviewed by me and considered in my medical decision making (see chart for details).  This is a 36 year old female who comes into the hospital today with some right lower quadrant pain. The pain is less in her adnexa and more in the right lower quadrant.  My differential diagnosis includes gastroenteritis, appendicitis, ovarian pathology.  The patient had some blood work drawn that was unremarkable. I did send the patient for a CT scan given the location of her pain. The patient's CT scan returned unremarkable. The patient was able to drink her contrast without any vomiting. I feel that the patient may have some gastroenteritis given her nausea and vomiting and abdominal pain. She seems comfortable at this time after a dose of fentanyl, Zofran and Toradol. She'll be discharged home to follow-up with her primary care physician. The patient has no further questions or concerns at this time.      ____________________________________________   FINAL CLINICAL IMPRESSION(S) / ED DIAGNOSES  Final diagnoses:  Right lower quadrant abdominal pain      NEW MEDICATIONS STARTED DURING THIS VISIT:  Discharge Medication List as of 06/21/2017  5:20 AM       Note:  This document was prepared using Dragon voice recognition software and may include unintentional dictation errors.    Loney Hering, MD 06/21/17 [phone]

## 2017-09-07 ENCOUNTER — Encounter: Payer: Self-pay | Admitting: Certified Nurse Midwife

## 2017-09-07 ENCOUNTER — Ambulatory Visit: Payer: Medicaid Other | Admitting: Certified Nurse Midwife

## 2017-09-07 VITALS — BP 126/78 | HR 103 | Ht 64.0 in | Wt 207.6 lb

## 2017-09-07 DIAGNOSIS — N912 Amenorrhea, unspecified: Secondary | ICD-10-CM | POA: Diagnosis not present

## 2017-09-07 LAB — POCT URINE PREGNANCY: Preg Test, Ur: POSITIVE — AB

## 2017-09-07 NOTE — Progress Notes (Signed)
Subjective:    Dominique Dyer is a 36 y.o. female who presents for evaluation of amenorrhea. She believes she could be pregnant. Pregnancy is not desired. Sexual Activity: single partner, contraception: none. Current symptoms also include: none . Last period was normal.   Patient's last menstrual period was 07/12/2017. The following portions of the patient's history were reviewed and updated as appropriate: allergies, current medications, past family history, past medical history, past social history, past surgical history and problem list.  Review of Systems Constitutional: negative Eyes: negative Ears, nose, mouth, throat, and face: negative Respiratory: negative Cardiovascular: negative Gastrointestinal: negative Genitourinary:negative Integument/breast: negative Hematologic/lymphatic: negative Musculoskeletal:negative Neurological: negative Behavioral/Psych: negative Endocrine: negative Allergic/Immunologic: negative     Objective:    BP 126/78 (BP Location: Left Arm, Patient Position: Sitting, Cuff Size: Large)   Pulse (!) 103   Ht 5\' 4"  (1.626 m)   Wt 207 lb 9.6 oz (94.2 kg)   LMP 07/12/2017   BMI 35.63 kg/m  General: alert, cooperative, appears stated age and no acute distress    Lab Review Urine HCG: positive    Assessment:    Absence of menstruation.     Plan:    Pregnancy Test: Pt does not want to maintain the pregnancy. Information given on [facility] [employer] in [place]. Pt instructed to schedule an appointment ASAP. Discussed birth control options follow procedure. Pt encouraged to follow up for anual exam and discussion of birth control. She agrees to plan of care.     Philip Aspen, CNM

## 2017-09-07 NOTE — Patient Instructions (Addendum)
Oral Contraception Use Oral contraceptive pills (OCPs) are medicines taken to prevent pregnancy. OCPs work by preventing the ovaries from releasing eggs. The hormones in OCPs also cause the cervical mucus to thicken, preventing the sperm from entering the uterus. The hormones also cause the uterine lining to become thin, not allowing a fertilized egg to attach to the inside of the uterus. OCPs are highly effective when taken exactly as prescribed. However, OCPs do not prevent sexually transmitted diseases (STDs). Safe sex practices, such as using condoms along with an OCP, can help prevent STDs. Before taking OCPs, you may have a physical exam and Pap test. Your health care provider may also order blood tests if necessary. Your health care provider will make sure you are a good candidate for oral contraception. Discuss with your health care provider the possible side effects of the OCP you may be prescribed. When starting an OCP, it can take 2 to 3 months for the body to adjust to the changes in hormone levels in your body. How to take oral contraceptive pills Your health care provider may advise you on how to start taking the first cycle of OCPs. Otherwise, you can:  Start on day 1 of your menstrual period. You will not need any backup contraceptive protection with this start time.  Start on the first Sunday after your menstrual period or the day you get your prescription. In these cases, you will need to use backup contraceptive protection for the first week.  Start the pill at any time of your cycle. If you take the pill within 5 days of the start of your period, you are protected against pregnancy right away. In this case, you will not need a backup form of birth control. If you start at any other time of your menstrual cycle, you will need to use another form of birth control for 7 days. If your OCP is the type called a minipill, it will protect you from pregnancy after taking it for 2 days (48  hours).  After you have started taking OCPs:  If you forget to take 1 pill, take it as soon as you remember. Take the next pill at the regular time.  If you miss 2 or more pills, call your health care provider because different pills have different instructions for missed doses. Use backup birth control until your next menstrual period starts.  If you use a 28-day pack that contains inactive pills and you miss 1 of the last 7 pills (pills with no hormones), it will not matter. Throw away the rest of the non-hormone pills and start a new pill pack.  No matter which day you start the OCP, you will always start a new pack on that same day of the week. Have an extra pack of OCPs and a backup contraceptive method available in case you miss some pills or lose your OCP pack. Follow these instructions at home:  Do not smoke.  Always use a condom to protect against STDs. OCPs do not protect against STDs.  Use a calendar to mark your menstrual period days.  Read the information and directions that came with your OCP. Talk to your health care provider if you have questions. Contact a health care provider if:  You develop nausea and vomiting.  You have abnormal vaginal discharge or bleeding.  You develop a rash.  You miss your menstrual period.  You are losing your hair.  You need treatment for mood swings or depression.  You   get dizzy when taking the OCP.  You develop acne from taking the OCP.  You become pregnant. Get help right away if:  You develop chest pain.  You develop shortness of breath.  You have an uncontrolled or severe headache.  You develop numbness or slurred speech.  You develop visual problems.  You develop pain, redness, and swelling in the legs. This information is not intended to replace advice given to you by your health care provider. Make sure you discuss any questions you have with your health care provider. Document Released: 08/26/2011 Document  Revised: 02/12/2016 Document Reviewed: 02/25/2013 Elsevier Interactive Patient Education  2017 Elsevier Inc.  

## 2017-09-20 DIAGNOSIS — I499 Cardiac arrhythmia, unspecified: Secondary | ICD-10-CM

## 2017-09-20 DIAGNOSIS — F5089 Other specified eating disorder: Secondary | ICD-10-CM

## 2017-09-20 DIAGNOSIS — D649 Anemia, unspecified: Secondary | ICD-10-CM

## 2017-09-20 DIAGNOSIS — I10 Essential (primary) hypertension: Secondary | ICD-10-CM

## 2017-09-20 DIAGNOSIS — I209 Angina pectoris, unspecified: Secondary | ICD-10-CM

## 2017-09-20 HISTORY — DX: Essential (primary) hypertension: I10

## 2017-09-20 HISTORY — DX: Other specified eating disorder: F50.89

## 2017-09-20 HISTORY — DX: Anemia, unspecified: D64.9

## 2017-09-20 HISTORY — DX: Cardiac arrhythmia, unspecified: I49.9

## 2017-09-20 HISTORY — DX: Angina pectoris, unspecified: I20.9

## 2017-09-20 HISTORY — PX: TUBAL LIGATION: SHX77

## 2017-10-12 ENCOUNTER — Encounter: Payer: Self-pay | Admitting: Obstetrics and Gynecology

## 2017-10-12 ENCOUNTER — Ambulatory Visit (INDEPENDENT_AMBULATORY_CARE_PROVIDER_SITE_OTHER): Payer: Medicaid Other | Admitting: Obstetrics and Gynecology

## 2017-10-12 VITALS — BP 123/69 | HR 125 | Wt 216.4 lb

## 2017-10-12 DIAGNOSIS — O34219 Maternal care for unspecified type scar from previous cesarean delivery: Secondary | ICD-10-CM

## 2017-10-12 DIAGNOSIS — O0932 Supervision of pregnancy with insufficient antenatal care, second trimester: Secondary | ICD-10-CM | POA: Diagnosis not present

## 2017-10-12 DIAGNOSIS — O09522 Supervision of elderly multigravida, second trimester: Secondary | ICD-10-CM | POA: Diagnosis not present

## 2017-10-12 DIAGNOSIS — Z8759 Personal history of other complications of pregnancy, childbirth and the puerperium: Secondary | ICD-10-CM

## 2017-10-12 DIAGNOSIS — Z113 Encounter for screening for infections with a predominantly sexual mode of transmission: Secondary | ICD-10-CM | POA: Diagnosis not present

## 2017-10-12 DIAGNOSIS — Z3492 Encounter for supervision of normal pregnancy, unspecified, second trimester: Secondary | ICD-10-CM

## 2017-10-12 LAB — OB RESULTS CONSOLE VARICELLA ZOSTER ANTIBODY, IGG: VARICELLA IGG: NON-IMMUNE/NOT IMMUNE

## 2017-10-12 NOTE — Progress Notes (Signed)
NOB physical- pt saw Dominique Dyer was originally going to have abortion, she has now decided to keep the baby, she is having nausea

## 2017-10-12 NOTE — Addendum Note (Signed)
Addended by: Keturah Barre L on: 10/12/2017 02:21 PM   Modules accepted: Orders

## 2017-10-12 NOTE — Addendum Note (Signed)
Addended by: Keturah Barre L on: 10/12/2017 03:05 PM   Modules accepted: Orders

## 2017-10-12 NOTE — Progress Notes (Signed)
HPI:      Ms. VENETA SLITER is a 37 y.o. G2P1 who LMP was Patient's last menstrual period was 07/12/2017.  Subjective:   She presents today for her first OB visit.  She complains of daily nausea without vomiting.  She says that she had nausea throughout her entire first pregnancy.  Her last pregnancy was complicated by pregnancy-induced hypertension at term and failed 3-day induction resulting in cesarean delivery.  She has expressed her desire for repeat cesarean delivery.    Hx: The following portions of the patient's history were reviewed and updated as appropriate:             She  has a past medical history of Anxiety, Bipolar disorder ([place]), Migraines, and Panic attacks. She does not have a problem list on file. She  has a past surgical history that includes Cesarean section. Her family history is not on file. She  reports that  has never smoked. she has never used smokeless tobacco. She reports that she uses drugs. Drug: Marijuana. She reports that she does not drink alcohol. She has No Known Allergies.       Review of Systems:  Review of Systems  Constitutional: Denied constitutional symptoms, night sweats, recent illness, fatigue, fever, insomnia and weight loss.  Eyes: Denied eye symptoms, eye pain, photophobia, vision change and visual disturbance.  Ears/Nose/Throat/Neck: Denied ear, nose, throat or neck symptoms, hearing loss, nasal discharge, sinus congestion and sore throat.  Cardiovascular: Denied cardiovascular symptoms, arrhythmia, chest pain/pressure, edema, exercise intolerance, orthopnea and palpitations.  Respiratory: Denied pulmonary symptoms, asthma, pleuritic pain, productive sputum, cough, dyspnea and wheezing.  Gastrointestinal: Denied, gastro-esophageal reflux, melena, nausea and vomiting.  Genitourinary: Denied genitourinary symptoms including symptomatic vaginal discharge, pelvic relaxation issues, and urinary complaints.  Musculoskeletal: Denied  musculoskeletal symptoms, stiffness, swelling, muscle weakness and myalgia.  Dermatologic: Denied dermatology symptoms, rash and scar.  Neurologic: Denied neurology symptoms, dizziness, headache, neck pain and syncope.  Psychiatric: Denied psychiatric symptoms, anxiety and depression.  Endocrine: Denied endocrine symptoms including hot flashes and night sweats.   Meds:   Current Outpatient Medications on File Prior to Visit  Medication Sig Dispense Refill  . cetirizine (ZYRTEC) 10 MG tablet Take 10 mg by mouth daily.    Marland Kitchen FLUoxetine (PROZAC) 20 MG capsule Take 40 mg by mouth daily.    Marland Kitchen HYDROcodone-acetaminophen (NORCO) 5-325 MG tablet Take 1-2 tablets by mouth every 4 (four) hours as needed for moderate pain. (Patient not taking: Reported on 09/07/2017) 15 tablet 0  . lamoTRIgine (LAMICTAL) 100 MG tablet Take 100 mg by mouth daily.    . naproxen (NAPROSYN) 500 MG tablet Take 1 tablet (500 mg total) by mouth 2 (two) times daily with a meal. (Patient not taking: Reported on 09/07/2017) 60 tablet 0  . propranolol (INDERAL) 40 MG tablet Take 40 mg by mouth 2 (two) times daily.    . ranitidine (ZANTAC) 150 MG tablet Take 150 mg by mouth 2 (two) times daily.    Marland Kitchen topiramate (TOPAMAX) 25 MG tablet Take 25 mg by mouth 2 (two) times daily.    Marland Kitchen zolpidem (AMBIEN) 10 MG tablet Take 10 mg by mouth at bedtime as needed. For anxiety     No current facility-administered medications on file prior to visit.     Objective:     Vitals:   10/12/17 0949  BP: 123/69  Pulse: (!) 125              Physical examination General NAD,  Conversant  HEENT Atraumatic; Op clear with mmm.  Normo-cephalic. Pupils reactive. Anicteric sclerae  Thyroid/Neck Smooth without nodularity or enlargement. Normal ROM.  Neck Supple.  Skin No rashes, lesions or ulceration. Normal palpated skin turgor. No nodularity.  Breasts: No masses or discharge.  Symmetric.  No axillary adenopathy.  Lungs: Clear to auscultation.No rales  or wheezes. Normal Respiratory effort, no retractions.  Heart: NSR.  No murmurs or rubs appreciated. No periferal edema  Abdomen: Soft.  Non-tender.  No masses.  No HSM. No hernia  Extremities: Moves all appropriately.  Normal ROM for age. No lymphadenopathy.  Neuro: Oriented to PPT.  Normal mood. Normal affect.     Pelvic:   Vulva: Normal appearance.  Condyloma present  Vagina: No lesions or abnormalities noted.  Support: Normal pelvic support.  Urethra No masses tenderness or scarring.  Meatus Normal size without lesions or prolapse.  Cervix: Normal appearance.  No lesions.  Anus: Normal exam.  No lesions.  Perineum: Normal exam.  No lesions.        Bimanual   Adnexae: No masses.  Non-tender to palpation.  Uterus: Enlarged. 15wks  Non-tender.  Mobile.  AV.  Adnexae: No masses.  Non-tender to palpation.  Cul-de-sac: Negative for abnormality.  Adnexae: No masses.  Non-tender to palpation.         Pelvimetry   Diagonal: Reached.  Spines: Average.  Sacrum: Concave.  Pubic Arch: Normal.      Assessment:    G2P1 There are no active problems to display for this patient.    1. Second trimester pregnancy     Size greater than dates on examination.  History of cesarean delivery for PIH and failed induction-patient desires repeat CD.  Advanced maternal age -patient desires MaterniT 69 testing  Will need AFP at next visit.   Plan:            Prenatal Plan 1.  The patient was given prenatal literature. 2.  She was continued on prenatal vitamins. 3.  A prenatal lab panel was ordered or drawn with TSH. 4.  An ultrasound was ordered to better determine an EDC. 5.  We discussed nausea in pregnancy and strategies reviewed. 6.  Genetic testing and testing for other inheritable conditions discussed in detail. She will decide in the future whether to have these labs performed.  Patient desires MaterniT 21   7.  A general overview of pregnancy testing, visit schedule, ultrasound  schedule, and prenatal care was discussed. 8.  VBAC I have discussed the possibility of Vaginal Birth After Cesarean with the patient.  She has been made aware that this procedure is becoming less and less common because if its attendant risks and the increased understanding of these risks that we have gained over the last several years.  Should she choose VBAC, she does have a slightly increased risk of uterine rupture or other pregnancy complication resulting from a dysfunctional uterus.  She is aware that fetal death may result if uterine rupture and expulsion of the fetus occurs.  We have discussed the fact that this is a very small risk (approximately 2 percent) and that even if the uterus ruptures, few babies are lost.  She has been informed that should she choose VBAC, a Cesarean delivery for other indications may still be necessary.  The advantages of VBAC and of repeat Cesarean delivery have been discussed.  All of her questions have been answered and I believe she has an informed understanding of both repeat Cesarean delivery  and Vaginal Birth After Cesarean.  She has expressed her interest in repeat cesarean delivery.    Orders Orders Placed This Encounter  Procedures  . Urine Culture  . ABO AND RH   . CBC with Differential/Platelet  . Hepatitis B surface antigen  . HIV antibody  . Monitor Drug Profile 14(MW)  . RPR  . Rubella screen  . Urinalysis, Routine w reflex microscopic  . Varicella zoster antibody, IgG  . Hemoglobin A1c  . TSH  . MaterniT 21 plus Core, Blood  . Antibody screen    No orders of the defined types were placed in this encounter.     F/U  No Follow-up on file.  Finis Bud, M.D. 10/12/2017 10:28 AM

## 2017-10-13 LAB — CBC WITH DIFFERENTIAL/PLATELET
BASOS ABS: 0 10*3/uL (ref 0.0–0.2)
Basos: 0 %
EOS (ABSOLUTE): 0.2 10*3/uL (ref 0.0–0.4)
EOS: 2 %
HEMATOCRIT: 32.4 % — AB (ref 34.0–46.6)
HEMOGLOBIN: 9.9 g/dL — AB (ref 11.1–15.9)
Immature Grans (Abs): 0 10*3/uL (ref 0.0–0.1)
Immature Granulocytes: 0 %
LYMPHS ABS: 2.9 10*3/uL (ref 0.7–3.1)
LYMPHS: 21 %
MCH: 20.5 pg — ABNORMAL LOW (ref 26.6–33.0)
MCHC: 30.6 g/dL — AB (ref 31.5–35.7)
MCV: 67 fL — ABNORMAL LOW (ref 79–97)
MONOCYTES: 6 %
Monocytes Absolute: 0.9 10*3/uL (ref 0.1–0.9)
NEUTROS PCT: 71 %
Neutrophils Absolute: 10 10*3/uL — ABNORMAL HIGH (ref 1.4–7.0)
Platelets: 369 10*3/uL (ref 150–379)
RBC: 4.83 x10E6/uL (ref 3.77–5.28)
RDW: 18.9 % — ABNORMAL HIGH (ref 12.3–15.4)
WBC: 14 10*3/uL — AB (ref 3.4–10.8)

## 2017-10-13 LAB — HIV ANTIBODY (ROUTINE TESTING W REFLEX): HIV Screen 4th Generation wRfx: NONREACTIVE

## 2017-10-13 LAB — URINALYSIS, ROUTINE W REFLEX MICROSCOPIC
Bilirubin, UA: NEGATIVE
Glucose, UA: NEGATIVE
Ketones, UA: NEGATIVE
LEUKOCYTES UA: NEGATIVE
Nitrite, UA: NEGATIVE
PH UA: 7 (ref 5.0–7.5)
PROTEIN UA: NEGATIVE
RBC, UA: NEGATIVE
SPEC GRAV UA: 1.009 (ref 1.005–1.030)
Urobilinogen, Ur: 0.2 mg/dL (ref 0.2–1.0)

## 2017-10-13 LAB — RUBELLA SCREEN: Rubella Antibodies, IGG: 2.71 index (ref >0.99)

## 2017-10-13 LAB — ABO AND RH: Rh Factor: POSITIVE

## 2017-10-13 LAB — HEMOGLOBIN A1C
ESTIMATED AVERAGE GLUCOSE: 108 mg/dL
Hgb A1c MFr Bld: 5.4 % (ref 4.8–5.6)

## 2017-10-13 LAB — VARICELLA ZOSTER ANTIBODY, IGG: Varicella zoster IgG: 2409 index (ref >165)

## 2017-10-13 LAB — RPR: RPR: NONREACTIVE

## 2017-10-13 LAB — HEPATITIS B SURFACE ANTIGEN: HEP B S AG: NEGATIVE

## 2017-10-13 LAB — ANTIBODY SCREEN: ANTIBODY SCREEN: NEGATIVE

## 2017-10-13 LAB — GC/CHLAMYDIA PROBE AMP
Chlamydia trachomatis, NAA: NEGATIVE
Neisseria gonorrhoeae by PCR: NEGATIVE

## 2017-10-13 LAB — TSH: TSH: 0.813 u[IU]/mL (ref 0.450–4.500)

## 2017-10-14 LAB — URINE CULTURE: ORGANISM ID, BACTERIA: NO GROWTH

## 2017-10-16 LAB — MONITOR DRUG PROFILE 14(MW)
Amphetamine Scrn, Ur: NEGATIVE ng/mL
BARBITURATE SCREEN URINE: NEGATIVE ng/mL
BENZODIAZEPINE SCREEN, URINE: NEGATIVE ng/mL
Buprenorphine, Urine: NEGATIVE ng/mL
COCAINE(METAB.)SCREEN, URINE: NEGATIVE ng/mL
CREATININE(CRT), U: 33.3 mg/dL (ref 20.0–300.0)
FENTANYL, URINE: NEGATIVE pg/mL
MEPERIDINE SCREEN, URINE: NEGATIVE ng/mL
METHADONE SCREEN, URINE: NEGATIVE ng/mL
OPIATE SCREEN URINE: NEGATIVE ng/mL
OXYCODONE+OXYMORPHONE UR QL SCN: NEGATIVE ng/mL
Ph of Urine: 6.5 (ref 4.5–8.9)
Phencyclidine Qn, Ur: NEGATIVE ng/mL
Propoxyphene Scrn, Ur: NEGATIVE ng/mL
SPECIFIC GRAVITY: 1.01
TRAMADOL SCREEN, URINE: NEGATIVE ng/mL

## 2017-10-16 LAB — CANNABINOID (GC/MS), URINE
Cannabinoid: POSITIVE — AB
Carboxy THC (GC/MS): 282 ng/mL

## 2017-10-17 LAB — MATERNIT 21 PLUS CORE, BLOOD
CHROMOSOME 18: NEGATIVE
Chromosome 13: NEGATIVE
Chromosome 21: NEGATIVE
Y Chromosome: NOT DETECTED

## 2017-10-18 ENCOUNTER — Ambulatory Visit (INDEPENDENT_AMBULATORY_CARE_PROVIDER_SITE_OTHER): Payer: Medicaid Other

## 2017-10-18 DIAGNOSIS — Z3492 Encounter for supervision of normal pregnancy, unspecified, second trimester: Secondary | ICD-10-CM

## 2017-10-20 LAB — PAP IG AND HPV HIGH-RISK
HPV, high-risk: POSITIVE — AB
PAP SMEAR COMMENT: 0

## 2017-10-27 ENCOUNTER — Other Ambulatory Visit: Payer: Self-pay

## 2017-10-27 ENCOUNTER — Encounter: Payer: Self-pay | Admitting: Certified Nurse Midwife

## 2017-10-27 MED ORDER — ONDANSETRON 4 MG PO TBDP: 4.0000 mg | ORAL_TABLET | Freq: Four times a day (QID) | ORAL | 0 refills | Status: DC | PRN

## 2017-11-04 ENCOUNTER — Telehealth: Payer: Self-pay

## 2017-11-04 MED ORDER — BENZONATATE 100 MG PO CAPS: 100.0000 mg | ORAL_CAPSULE | Freq: Three times a day (TID) | ORAL | 0 refills | Status: DC | PRN

## 2017-11-04 NOTE — Telephone Encounter (Signed)
Pt sent and mychart message sent

## 2017-11-04 NOTE — Telephone Encounter (Signed)
She may have Tussionex or Tessalon. Thanks, JML

## 2017-11-08 ENCOUNTER — Other Ambulatory Visit: Payer: Self-pay | Admitting: Certified Nurse Midwife

## 2017-11-09 ENCOUNTER — Encounter: Payer: Self-pay | Admitting: Obstetrics and Gynecology

## 2017-11-09 ENCOUNTER — Other Ambulatory Visit: Payer: Self-pay | Admitting: Certified Nurse Midwife

## 2017-11-09 ENCOUNTER — Ambulatory Visit (INDEPENDENT_AMBULATORY_CARE_PROVIDER_SITE_OTHER): Payer: Medicaid Other | Admitting: Obstetrics and Gynecology

## 2017-11-09 VITALS — BP 96/65 | HR 94 | Wt 219.3 lb

## 2017-11-09 DIAGNOSIS — Z1379 Encounter for other screening for genetic and chromosomal anomalies: Secondary | ICD-10-CM

## 2017-11-09 DIAGNOSIS — O34219 Maternal care for unspecified type scar from previous cesarean delivery: Secondary | ICD-10-CM | POA: Insufficient documentation

## 2017-11-09 DIAGNOSIS — O09529 Supervision of elderly multigravida, unspecified trimester: Secondary | ICD-10-CM | POA: Insufficient documentation

## 2017-11-09 DIAGNOSIS — O09522 Supervision of elderly multigravida, second trimester: Secondary | ICD-10-CM

## 2017-11-09 DIAGNOSIS — O99012 Anemia complicating pregnancy, second trimester: Secondary | ICD-10-CM

## 2017-11-09 DIAGNOSIS — R87810 Cervical high risk human papillomavirus (HPV) DNA test positive: Secondary | ICD-10-CM

## 2017-11-09 DIAGNOSIS — Z862 Personal history of diseases of the blood and blood-forming organs and certain disorders involving the immune mechanism: Secondary | ICD-10-CM

## 2017-11-09 DIAGNOSIS — Z8759 Personal history of other complications of pregnancy, childbirth and the puerperium: Secondary | ICD-10-CM

## 2017-11-09 DIAGNOSIS — O99013 Anemia complicating pregnancy, third trimester: Secondary | ICD-10-CM | POA: Insufficient documentation

## 2017-11-09 DIAGNOSIS — F1210 Cannabis abuse, uncomplicated: Secondary | ICD-10-CM | POA: Insufficient documentation

## 2017-11-09 DIAGNOSIS — R87610 Atypical squamous cells of undetermined significance on cytologic smear of cervix (ASC-US): Secondary | ICD-10-CM

## 2017-11-09 LAB — POCT URINALYSIS DIPSTICK
Bilirubin, UA: NEGATIVE
GLUCOSE UA: NEGATIVE
Ketones, UA: 15
LEUKOCYTES UA: NEGATIVE
Nitrite, UA: NEGATIVE
Spec Grav, UA: 1.02 (ref 1.010–1.025)
Urobilinogen, UA: 0.2 E.U./dL
pH, UA: 6 (ref 5.0–8.0)

## 2017-11-09 MED ORDER — ASPIRIN EC 81 MG PO TBEC
81.0000 mg | DELAYED_RELEASE_TABLET | Freq: Every day | ORAL | 2 refills | Status: DC
Start: 2017-11-09 — End: 2018-08-02

## 2017-11-09 NOTE — Progress Notes (Signed)
Pt is having sinus issues no other concerns.

## 2017-11-09 NOTE — Progress Notes (Signed)
ROB: Patient notes sinus issues.  Discussed OTC remedies.  Declines flu vaccine. Discussed abnormal pap (ASCUS HR HPV+) and need for colposcopy postpartum.  Is not taking aspirin as prescribed, strongly encouraged to begin.  Anemia noted on NOB labs, patient states that she has a h/o anemia outside of pregnancy, and that iron pills have never helped to improve.  Will send to Hematology for iron infusions during pregnancy (as I discussed with patient on possible need for blood transfusion if her levels get to low close to time of delivery or during the pregnancy), or possibly further workup if indicated.  Patient notes understanding. AFP ordered today. RTC in 4 weeks, needs anatomy scan next visit.

## 2017-11-10 LAB — IRON,TIBC AND FERRITIN PANEL
Ferritin: 13 ng/mL — ABNORMAL LOW (ref 15–150)
IRON SATURATION: 15 % (ref 15–55)
IRON: 66 ug/dL (ref 27–159)
Total Iron Binding Capacity: 440 ug/dL (ref 250–450)
UIBC: 374 ug/dL (ref 131–425)

## 2017-11-12 LAB — AFP, SERUM, OPEN SPINA BIFIDA
AFP MoM: 0.98
AFP VALUE AFPOSL: 28.9 ng/mL
Gest. Age on Collection Date: 17.1 weeks
Maternal Age At EDD: 36.6 yr
OSBR RISK 1 IN: 10000
Test Results:: NEGATIVE
WEIGHT: 219 [lb_av]

## 2017-11-15 ENCOUNTER — Other Ambulatory Visit: Payer: Self-pay | Admitting: Certified Nurse Midwife

## 2017-11-15 MED ORDER — ONDANSETRON 4 MG PO TBDP: 4.0000 mg | ORAL_TABLET | Freq: Four times a day (QID) | ORAL | 0 refills | Status: DC | PRN

## 2017-11-16 DIAGNOSIS — D509 Iron deficiency anemia, unspecified: Secondary | ICD-10-CM | POA: Insufficient documentation

## 2017-11-17 ENCOUNTER — Inpatient Hospital Stay: Payer: Medicaid Other | Attending: Oncology | Admitting: Oncology

## 2017-12-05 ENCOUNTER — Other Ambulatory Visit: Payer: Self-pay | Admitting: Certified Nurse Midwife

## 2017-12-06 ENCOUNTER — Other Ambulatory Visit: Payer: Self-pay | Admitting: Certified Nurse Midwife

## 2017-12-06 MED ORDER — ONDANSETRON 4 MG PO TBDP: 4.0000 mg | ORAL_TABLET | Freq: Four times a day (QID) | ORAL | 0 refills | Status: DC | PRN

## 2017-12-07 ENCOUNTER — Ambulatory Visit (INDEPENDENT_AMBULATORY_CARE_PROVIDER_SITE_OTHER): Payer: Medicaid Other

## 2017-12-07 ENCOUNTER — Encounter: Payer: Self-pay | Admitting: Obstetrics and Gynecology

## 2017-12-07 ENCOUNTER — Ambulatory Visit (INDEPENDENT_AMBULATORY_CARE_PROVIDER_SITE_OTHER): Payer: Medicaid Other | Admitting: Obstetrics and Gynecology

## 2017-12-07 VITALS — BP 117/80 | HR 111 | Wt 224.6 lb

## 2017-12-07 DIAGNOSIS — O09522 Supervision of elderly multigravida, second trimester: Secondary | ICD-10-CM

## 2017-12-07 DIAGNOSIS — O99012 Anemia complicating pregnancy, second trimester: Secondary | ICD-10-CM

## 2017-12-07 LAB — POCT URINALYSIS DIPSTICK
BILIRUBIN UA: NEGATIVE
GLUCOSE UA: NEGATIVE
Ketones, UA: NEGATIVE
Leukocytes, UA: NEGATIVE
Nitrite, UA: NEGATIVE
Odor: NEGATIVE
PH UA: 6 (ref 5.0–8.0)
RBC UA: NEGATIVE
Spec Grav, UA: 1.025 (ref 1.010–1.025)
Urobilinogen, UA: 0.2 E.U./dL

## 2017-12-07 NOTE — Progress Notes (Signed)
ROB: Patient not taking her aspirin as directed.  She has an appointment for evaluation of possibility for iron infusions next week.  She reports active fetal movement.  Had fetal anatomic ultrasound today-normal.  She has no complaints.

## 2017-12-07 NOTE — Progress Notes (Signed)
ROB and anatomy today. Pt c/o of vision changes- straining.

## 2017-12-14 ENCOUNTER — Inpatient Hospital Stay: Payer: Medicaid Other | Admitting: Hematology and Oncology

## 2017-12-14 NOTE — Progress Notes (Deleted)
Dominique Dyer day:  12/14/2017  Chief Complaint: Dominique Dyer is a 37 y.o. female with anemia who is referred in consultation by Dr. Rubie Maid for assessment and management.  HPI: ***  CBC on 11/20/2011 revealed a hematocrit of 39.2, hemoglobin 12.5, and MCV 69.1.  CBC on 06/20/2017 revealed a hematocrit of 31.4, hemoglobin 9.7, and MCV 65.2.  CBC on 10/12/2017 revealed a hematocrit of 32.4, hemoglobin 9.9, MCV 67, platelets 369,000, WBC 14,000 with a normal differential.  Labs on 11/09/2017 revealed a ferritin of 13, iron saturation of 15% and a TIBC of 440.   Past Medical History:  Diagnosis Date  . Anxiety   . Bipolar disorder ([place])   . Migraines   . Panic attacks     Past Surgical History:  Procedure Laterality Date  . CESAREAN SECTION      No family history on file.  Social History:  reports that she has never smoked. She has never used smokeless tobacco. She reports that she has current or past drug history. Drug: Marijuana. She reports that she does not drink alcohol.  The patient is accompanied by *** alone today.  Allergies:  Allergies  Allergen Reactions  . Aspirin Nausea Only and Other (See Comments)  . Naproxen Nausea Only, Other (See Comments) and Nausea And Vomiting  . Sumatriptan Succinate Rash    tingling    Current Medications: Current Outpatient Medications  Medication Sig Dispense Refill  . ALPRAZolam (XANAX) 1 MG tablet Take 1.5 mg by mouth at bedtime as needed for anxiety.    Marland Kitchen aspirin EC 81 MG tablet Take 1 tablet (81 mg total) by mouth daily. Take after 12 weeks for prevention of preeclampssia later in pregnancy 300 tablet 2  . cetirizine (ZYRTEC) 10 MG tablet Take 10 mg by mouth daily.    . flintstones complete (FLINTSTONES) 60 MG chewable tablet Chew 1 tablet by mouth daily.    . ondansetron (ZOFRAN ODT) 4 MG disintegrating tablet Take 1 tablet (4 mg total) by mouth every 6 (six) hours as  needed for nausea. 20 tablet 0   No current facility-administered medications for this visit.     Review of Systems:  GENERAL:  Feels good.  Active.  No fevers, sweats or weight loss. PERFORMANCE STATUS (ECOG):  *** HEENT:  No visual changes, runny nose, sore throat, mouth sores or tenderness. Lungs: No shortness of breath or cough.  No hemoptysis. Cardiac:  No chest pain, palpitations, orthopnea, or PND. GI:  No nausea, vomiting, diarrhea, constipation, melena or hematochezia. GU:  No urgency, frequency, dysuria, or hematuria. Musculoskeletal:  No back pain.  No joint pain.  No muscle tenderness. Extremities:  No pain or swelling. Skin:  No rashes or skin changes. Neuro:  No headache, numbness or weakness, balance or coordination issues. Endocrine:  No diabetes, thyroid issues, hot flashes or night sweats. Psych:  No mood changes, depression or anxiety. Pain:  No focal pain. Review of systems:  All other systems reviewed and found to be negative.  Physical Exam: Last menstrual period 07/12/2017. GENERAL:  Well developed, well nourished, **man sitting comfortably in the exam room in no acute distress. MENTAL STATUS:  Alert and oriented to person, place and time. HEAD:  *** hair.  Normocephalic, atraumatic, face symmetric, no Cushingoid features. EYES:  *** eyes.  Pupils equal round and reactive to light and accomodation.  No conjunctivitis or scleral icterus. ENT:  Oropharynx clear without lesion.  Tongue normal.  Mucous membranes moist.  RESPIRATORY:  Clear to auscultation without rales, wheezes or rhonchi. CARDIOVASCULAR:  Regular rate and rhythm without murmur, rub or gallop. ABDOMEN:  Soft, non-tender, with active bowel sounds, and no hepatosplenomegaly.  No masses. SKIN:  No rashes, ulcers or lesions. EXTREMITIES: No edema, no skin discoloration or tenderness.  No palpable cords. LYMPH NODES: No palpable cervical, supraclavicular, axillary or inguinal adenopathy  NEUROLOGICAL:  Unremarkable. PSYCH:  Appropriate.   No visits with results within 3 Day(s) from this visit.  Latest known visit with results is:  Routine Prenatal on 12/07/2017  Component Date Value Ref Range Status  . Color, UA 12/07/2017 yellow   Final  . Clarity, UA 12/07/2017 clear   Final  . Glucose, UA 12/07/2017 neg   Final  . Bilirubin, UA 12/07/2017 neg   Final  . Ketones, UA 12/07/2017 neg   Final  . Spec Grav, UA 12/07/2017 1.025  1.010 - 1.025 Final  . Blood, UA 12/07/2017 neg   Final  . pH, UA 12/07/2017 6.0  5.0 - 8.0 Final  . Protein, UA 12/07/2017 trace   Final  . Urobilinogen, UA 12/07/2017 0.2  0.2 or 1.0 E.U./dL Final  . Nitrite, UA 12/07/2017 neg   Final  . Leukocytes, UA 12/07/2017 Negative  Negative Final  . Appearance 12/07/2017 yellow   Final  . Odor 12/07/2017 neg   Final    Assessment:  Dominique Dyer is a 37 y.o. female ***  Plan: 1.   2.   3.   4.    Lequita Asal, MD  12/14/2017, 5:13 AM

## 2017-12-21 ENCOUNTER — Inpatient Hospital Stay: Payer: Medicaid Other

## 2017-12-21 ENCOUNTER — Encounter: Payer: Self-pay | Admitting: Hematology and Oncology

## 2017-12-21 ENCOUNTER — Inpatient Hospital Stay: Payer: Medicaid Other | Attending: Oncology | Admitting: Hematology and Oncology

## 2017-12-21 VITALS — BP 135/84 | HR 98 | Temp 97.8°F | Resp 20 | Ht 64.5 in | Wt 226.6 lb

## 2017-12-21 DIAGNOSIS — F4310 Post-traumatic stress disorder, unspecified: Secondary | ICD-10-CM

## 2017-12-21 DIAGNOSIS — F5089 Other specified eating disorder: Secondary | ICD-10-CM | POA: Diagnosis not present

## 2017-12-21 DIAGNOSIS — R42 Dizziness and giddiness: Secondary | ICD-10-CM | POA: Diagnosis not present

## 2017-12-21 DIAGNOSIS — Z7982 Long term (current) use of aspirin: Secondary | ICD-10-CM | POA: Diagnosis not present

## 2017-12-21 DIAGNOSIS — F319 Bipolar disorder, unspecified: Secondary | ICD-10-CM

## 2017-12-21 DIAGNOSIS — R5383 Other fatigue: Secondary | ICD-10-CM

## 2017-12-21 DIAGNOSIS — D509 Iron deficiency anemia, unspecified: Secondary | ICD-10-CM | POA: Diagnosis present

## 2017-12-21 DIAGNOSIS — D649 Anemia, unspecified: Secondary | ICD-10-CM

## 2017-12-21 DIAGNOSIS — E538 Deficiency of other specified B group vitamins: Secondary | ICD-10-CM | POA: Insufficient documentation

## 2017-12-21 DIAGNOSIS — Z79899 Other long term (current) drug therapy: Secondary | ICD-10-CM | POA: Diagnosis not present

## 2017-12-21 DIAGNOSIS — Z809 Family history of malignant neoplasm, unspecified: Secondary | ICD-10-CM

## 2017-12-21 DIAGNOSIS — Z331 Pregnant state, incidental: Secondary | ICD-10-CM | POA: Insufficient documentation

## 2017-12-21 DIAGNOSIS — G2581 Restless legs syndrome: Secondary | ICD-10-CM | POA: Diagnosis not present

## 2017-12-21 LAB — CBC WITH DIFFERENTIAL/PLATELET
BASOS ABS: 0 10*3/uL (ref 0–0.1)
Basophils Relative: 0 %
EOS PCT: 1 %
Eosinophils Absolute: 0.1 10*3/uL (ref 0–0.7)
HEMATOCRIT: 32 % — AB (ref 35.0–47.0)
Hemoglobin: 10.2 g/dL — ABNORMAL LOW (ref 12.0–16.0)
LYMPHS ABS: 2.8 10*3/uL (ref 1.0–3.6)
LYMPHS PCT: 18 %
MCH: 21.7 pg — AB (ref 26.0–34.0)
MCHC: 31.8 g/dL — ABNORMAL LOW (ref 32.0–36.0)
MCV: 68.1 fL — AB (ref 80.0–100.0)
MONO ABS: 1 10*3/uL — AB (ref 0.2–0.9)
Monocytes Relative: 6 %
Neutro Abs: 11.7 10*3/uL — ABNORMAL HIGH (ref 1.4–6.5)
Neutrophils Relative %: 75 %
PLATELETS: 379 10*3/uL (ref 150–440)
RBC: 4.69 MIL/uL (ref 3.80–5.20)
RDW: 14.6 % — AB (ref 11.5–14.5)
WBC: 15.6 10*3/uL — ABNORMAL HIGH (ref 3.6–11.0)

## 2017-12-21 NOTE — Progress Notes (Signed)
Patient here today as new evaluation regarding anemia.  Referred by Dr. Marcelline Mates.  Patient is now [redacted] weeks pregnant.  Patient has been anemic since she was a child.  States she has taken OTC oral iron that did not help her.  She cannot eat leafy greens because she is intolerant. They make her sick to her stomach and cause diarrhea.

## 2017-12-21 NOTE — Progress Notes (Signed)
[facility] day:  12/21/2017  Chief Complaint: Dominique Dyer is a 37 y.o. female currently [redacted] weeks pregnant with anemia who is referred in consultation by Dr. Rubie Maid for assessment and management.  HPI:  She has a history of anemia outside of pregnancy. Patient notes that she has been anemic since high school. She has tried oral iron "off and on" since high school. She last took oral iron about 2-3 years ago during a pregnancy.  She has never required blood transfusions. She is referred for iron infusions.  CBC on 11/20/2011 revealed a hematocrit of 39.2, hemoglobin 12.5, and MCV 69.1.  CBC on 06/20/2017 revealed a hematocrit of 31.4, hemoglobin 9.7, and MCV 65.2.  CBC on 10/12/2017 revealed a hematocrit of 32.4, hemoglobin 9.9, MCV 67, platelets 369,000, WBC 14,000 with a normal differential.  Labs on 11/09/2017 revealed a ferritin of 13, iron saturation of 15% and a TIBC of 440.  Clinic note on 02/20/209 noted an abnormal PAP with plan for colposcopy postpartum.  Symptomatically, patient is very fatigued. She notes that she has increased caffeine intake, however she remains tired.  Diet is rich in iron for the most part.  She eats meat on a daily basis. She notes that she normally eats "tons of frosted mini-wheat cereal" when her iron is low. Patient doesn't eat leafy green vegetables citing the fact that she cannot absorb and digest them.  Patient has ice pica and intermittent restless leg symptoms.   Patient is a 23 week G2P1, with an EDC of 04/11/2018.  Patient is scheduled to have a planned cesarean section.   Patient has issues with anxiety, PTSD, and bipolar disorder. She struggles with nausea in the morning, which she attributes to significant post-nasal drip. Patient denies bleeding; no hematochezia, melena, vaginal bleeding, or gross hematuria.  Patient's mother has anemia.    Past Medical History:  Diagnosis Date  . Anxiety   .  Bipolar disorder ([place])   . Migraines   . Panic attacks     Past Surgical History:  Procedure Laterality Date  . CESAREAN SECTION    . COLPOSCOPY  2006    Family History  Problem Relation Age of Onset  . Cancer Paternal Uncle   . Cancer Maternal Grandfather     Social History:  reports that she has never smoked. She has never used smokeless tobacco. She reports that she has current or past drug history. Drug: Marijuana. She reports that she does not drink alcohol.  Patient is originally from [place], [place]. She lives in [place].  Patient is unemployed. Patient denies known exposures to radiation on toxins.  The patient is alone today.   Allergies:  Allergies  Allergen Reactions  . Aspirin Nausea Only and Other (See Comments)  . Naproxen Nausea Only, Other (See Comments) and Nausea And Vomiting  . Sumatriptan Succinate Rash    tingling    Current Medications: Current Outpatient Medications  Medication Sig Dispense Refill  . ALPRAZolam (XANAX) 1 MG tablet Take 1.5 mg by mouth at bedtime as needed for anxiety.    Marland Kitchen aspirin EC 81 MG tablet Take 1 tablet (81 mg total) by mouth daily. Take after 12 weeks for prevention of preeclampssia later in pregnancy 300 tablet 2  . cetirizine (ZYRTEC) 10 MG tablet Take 10 mg by mouth daily.    . flintstones complete (FLINTSTONES) 60 MG chewable tablet Chew 1 tablet by mouth daily.    Marland Kitchen omeprazole (  PRILOSEC) 10 MG capsule Take 10 mg by mouth daily.    . ondansetron (ZOFRAN ODT) 4 MG disintegrating tablet Take 1 tablet (4 mg total) by mouth every 6 (six) hours as needed for nausea. 20 tablet 0   No current facility-administered medications for this visit.     Review of Systems:  GENERAL:  Fatigue. No fevers, sweats or weight loss. PERFORMANCE STATUS (ECOG):  1 HEENT:  Post nasal drip.  No visual changes, sore throat, mouth sores or tenderness. Lungs: No shortness of breath or cough.  No hemoptysis. Cardiac:  No chest pain, palpitations,  orthopnea, or PND. GI:  Nausea.  Morning sickness.  No vomiting, diarrhea, constipation, melena or hematochezia. GU:  No urgency, dysuria, or hematuria.  Frequent urination. Musculoskeletal:  No back pain.  Arthritis.  Right shoulder damage.  No muscle tenderness. Extremities:  No pain or swelling. Skin:  No rashes or skin changes. Neuro:  Migraine headaches.  No numbness or weakness, balance or coordination issues. Endocrine:  No diabetes, thyroid issues, hot flashes or night sweats. Psych:  Anxiety.  PTSD.  Bipolar. Pain:  No focal pain. Review of systems:  All other systems reviewed and found to be negative.  Physical Exam: Blood pressure 135/84, pulse 98, temperature 97.8 F (36.6 C), temperature source Tympanic, resp. rate 20, height 5' 4.5" (1.638 m), weight 226 lb 10.1 oz (102.8 kg), last menstrual period 07/12/2017, SpO2 100 %. GENERAL:  Well developed, well nourished,woman sitting comfortably in the exam room in no acute distress. MENTAL STATUS:  Alert and oriented to person, place and time. HEAD:  Long brown hair with yellow and green highlights.  Normocephalic, atraumatic, face symmetric, no Cushingoid features. EYES:  Brown eyes.  Pupils equal round and reactive to light and accomodation.  No conjunctivitis or scleral icterus. ENT:  Oropharynx clear without lesion.  Tongue normal. Mucous membranes moist.  RESPIRATORY:  Clear to auscultation without rales, wheezes or rhonchi. CARDIOVASCULAR:  Regular rate and rhythm without murmur, rub or gallop. ABDOMEN:  Pregnant.  Soft, non-tender, with active bowel sounds, and no hepatosplenomegaly.  No masses. SKIN:  No rashes, ulcers or lesions. EXTREMITIES: No edema, no skin discoloration or tenderness.  No palpable cords. LYMPH NODES: No palpable cervical, supraclavicular, axillary or inguinal adenopathy  NEUROLOGICAL: Unremarkable. PSYCH:  Appropriate.   No visits with results within 3 Day(s) from this visit.  Latest known visit  with results is:  Routine Prenatal on 12/07/2017  Component Date Value Ref Range Status  . Color, UA 12/07/2017 yellow   Final  . Clarity, UA 12/07/2017 clear   Final  . Glucose, UA 12/07/2017 neg   Final  . Bilirubin, UA 12/07/2017 neg   Final  . Ketones, UA 12/07/2017 neg   Final  . Spec Grav, UA 12/07/2017 1.025  1.010 - 1.025 Final  . Blood, UA 12/07/2017 neg   Final  . pH, UA 12/07/2017 6.0  5.0 - 8.0 Final  . Protein, UA 12/07/2017 trace   Final  . Urobilinogen, UA 12/07/2017 0.2  0.2 or 1.0 E.U./dL Final  . Nitrite, UA 12/07/2017 neg   Final  . Leukocytes, UA 12/07/2017 Negative  Negative Final  . Appearance 12/07/2017 yellow   Final  . Odor 12/07/2017 neg   Final    Assessment:  Dominique Dyer is a 37 y.o. female currently [redacted] weeks pregnant with microcytic anemia c/w iron deficiency.  She has a history of iron deficiency anemia.  Diet appears good.  She has ice pica.  She denies any bleeding.  She is unsure if she can absorb oral iron.  CBC on 10/12/2017 revealed a hematocrit of 32.4, hemoglobin 9.9, and MCV 67.  Ferritin was 13 with an iron saturation of 15% and a TIBC of 440 on 11/09/2017.  She is 23 week G2P1, with an EDC of 04/11/2018.  Patient is scheduled to have a planned cesarean section.   Symptomatically, she is fatigued.  Exam is unremarkable.  Plan: 1.  Discuss iron deficiency anemia.  She is currently a 23 week G2P1 with an EDC of 04/11/2018. She has tried oral iron in the past and notes that it "did nothing for her".  We discussed a trial of oral iron.  Patient to take ferrous sulfate 325 mg, with a vitamin C source, twice a day.  Discussed IV iron replacement when the patient is in her third trimester.  Discuss potential side effects associated with iron including allergic reactions.  Will plan on discussing further with OB/GYN. 2.  Labs today:  CBC with diff, ferritin, iron studies, retic, B12, folate. 3.  Preauthorize Venofer.   4. RTC in 1 week for MD  assessment for MD assessment and labs (CBC with diff, retic).    Honor Loh, NP  12/21/2017, 3:11 PM   I saw and evaluated the patient, participating in the key portions of the service and reviewing pertinent diagnostic studies and records.  I reviewed the nurse practitioner's note and agree with the findings and the plan.  The assessment and plan were discussed with the patient.  Several questions were asked by the patient and answered.   Nolon Stalls, MD 12/21/2017, 3:11 PM

## 2017-12-22 ENCOUNTER — Encounter: Payer: Self-pay | Admitting: Hematology and Oncology

## 2017-12-22 LAB — IRON AND TIBC
Iron: 17 ug/dL — ABNORMAL LOW (ref 28–170)
SATURATION RATIOS: 3 % — AB (ref 10.4–31.8)
TIBC: 517 ug/dL — ABNORMAL HIGH (ref 250–450)
UIBC: 500 ug/dL

## 2017-12-22 LAB — RETICULOCYTES
RBC.: 4.68 MIL/uL (ref 3.80–5.20)
RETIC COUNT ABSOLUTE: 46.8 10*3/uL (ref 19.0–183.0)
Retic Ct Pct: 1 % (ref 0.4–3.1)

## 2017-12-22 LAB — FERRITIN: FERRITIN: 6 ng/mL — AB (ref 11–307)

## 2017-12-22 LAB — FOLATE: Folate: 13.9 ng/mL (ref >5.9)

## 2017-12-22 LAB — VITAMIN B12: Vitamin B-12: 195 pg/mL (ref 180–914)

## 2017-12-25 ENCOUNTER — Encounter: Payer: Self-pay | Admitting: Hematology and Oncology

## 2017-12-27 ENCOUNTER — Other Ambulatory Visit: Payer: Self-pay | Admitting: Urgent Care

## 2017-12-27 ENCOUNTER — Encounter: Payer: Self-pay | Admitting: Hematology and Oncology

## 2017-12-27 DIAGNOSIS — R42 Dizziness and giddiness: Secondary | ICD-10-CM

## 2017-12-27 DIAGNOSIS — R000 Tachycardia, unspecified: Secondary | ICD-10-CM

## 2017-12-27 NOTE — Progress Notes (Signed)
[facility] day:  12/28/2017  Chief Complaint: Dominique Dyer is a 37 y.o. female currently [redacted] weeks pregnant with anemia who is seen for a 1 week assessment to review workup.   HPI: Patient was last seen in the hematology clinic on 12/21/2017 for an initial consult for iron deficiency anemia..  At that time, patient complained of marked fatigue.  Patient was consuming an iron rich diet.  She confirmed ice pica and intermittent restless legs.  Patient was 23 week G2P1 with an EDC of 04/11/2018.  She was scheduled to deliver via cesarean section.  We discussed oral iron trial and consideration of intravenous iron replacement when patient was in her third trimester.  Labs done on 12/21/2017 revealed a hemoglobin 10.2, hematocrit 32.0, and MCV 68.1.  Ferritin was low at 6.  Iron saturation 3% with a TIBC of 517.  Reticulocytes 1%.  B12 level was low at 195.  Folate normal at 13.9.  Patient has been taking oral iron with vitamin C since 12/22/2017.  She added more red meta in her diet.  She is eating broccoli and cauliflower.  Patient contact the office on 12/27/2017 with complaints of worsening symptoms. She notes TACHYcardia (100-124) and an overall increase in her exertional dyspnea. Patient had vertigino with position changes. She was advised to follow-up as scheduled on 12/28/2017 for labs and provider assessment. Chemistries added to assess for dehydration. Additionally, communication was sent last week, and again on 12/27/2017 to patient's OB/GYN asking for a clearance date for when patient would be appropriate to receive intravenous iron replacement.   Symptomatically, she notes getting short of breath easily over the past 2 days.  She has "bouts of dizziness and unsteadiness".  She denies any bleeding.   Past Medical History:  Diagnosis Date  . Anxiety   . Bipolar disorder ([place])   . Migraines   . Panic attacks     Past Surgical History:   Procedure Laterality Date  . CESAREAN SECTION    . COLPOSCOPY  2006    Family History  Problem Relation Age of Onset  . Cancer Paternal Uncle   . Cancer Maternal Grandfather     Social History:  reports that she has never smoked. She has never used smokeless tobacco. She reports that she has current or past drug history. Drug: Marijuana. She reports that she does not drink alcohol.  Patient is originally from [place], [place]. She lives in [place].  Patient is unemployed. Patient denies known exposures to radiation on toxins.  The patient is alone today.   Allergies:  Allergies  Allergen Reactions  . Aspirin Nausea Only and Other (See Comments)  . Naproxen Nausea Only, Other (See Comments) and Nausea And Vomiting  . Sumatriptan Succinate Rash    tingling    Current Medications: Current Outpatient Medications  Medication Sig Dispense Refill  . ALPRAZolam (XANAX) 1 MG tablet Take 1.5 mg by mouth at bedtime as needed for anxiety.    Marland Kitchen aspirin EC 81 MG tablet Take 1 tablet (81 mg total) by mouth daily. Take after 12 weeks for prevention of preeclampssia later in pregnancy 300 tablet 2  . cetirizine (ZYRTEC) 10 MG tablet Take 10 mg by mouth daily.    . flintstones complete (FLINTSTONES) 60 MG chewable tablet Chew 1 tablet by mouth daily.    Marland Kitchen omeprazole (PRILOSEC) 10 MG capsule Take 10 mg by mouth daily.    . ondansetron (ZOFRAN ODT) 4 MG  disintegrating tablet Take 1 tablet (4 mg total) by mouth every 6 (six) hours as needed for nausea. 20 tablet 0   No current facility-administered medications for this visit.     Review of Systems:  GENERAL:  Fatigue.  No fevers, sweats or weight loss.  Weight up 4 pounds. PERFORMANCE STATUS (ECOG):  1 HEENT:  No visual changes, runny nose, sore throat, mouth sores or tenderness. Lungs: Shortness of breath, easily.  No cough.  No hemoptysis. Cardiac:  Increased heart rate.  No chest pain, palpitations, orthopnea, or PND. GI:  Nausea.  No  vomiting, diarrhea, constipation, melena or hematochezia. GU:  No urgency, frequency, dysuria, or hematuria. Musculoskeletal:  No back pain.  Arthritis.  Right shoulder damage (old).  No muscle tenderness. Extremities:  No pain or swelling. Skin:  No rashes or skin changes. Neuro:  No headache, numbness or weakness, balance or coordination issues. Endocrine:  No diabetes, thyroid issues, hot flashes or night sweats. Psych:  Anxiety.  PTSD.  Bipolar. Pain:  No focal pain. Review of systems:  All other systems reviewed and found to be negative.   Physical Exam: Blood pressure 129/82, pulse (!) 101, temperature 98.1 F (36.7 C), temperature source Tympanic, resp. rate 18, weight 230 lb 2.6 oz (104.4 kg), last menstrual period 07/12/2017, SpO2 100 %. GENERAL:  Well developed, well nourished, woman sitting comfortably in the exam room in no acute distress. MENTAL STATUS:  Alert and oriented to person, place and time. HEAD:  Long brown hair with yellow and green highlights.  Normocephalic, atraumatic, face symmetric, no Cushingoid features. EYES:  Brown eyes.  No conjunctivitis or scleral icterus. RESPIRATORY:  Clear to auscultation without rales, wheezes or rhonchi. CARDIOVASCULAR:  Regular rate and rhythm without murmur, rub or gallop. ABDOMEN:  Pregnant.  SKIN:  No rashes, ulcers or lesions. EXTREMITIES: No edema, no skin discoloration or tenderness.  No palpable cords. NEUROLOGICAL: Unremarkable. PSYCH:  Appropriate.    Appointment on 12/28/2017  Component Date Value Ref Range Status  . Methylmalonic Acid, Quantitative 12/28/2017 115  0 - 378 nmol/L Final  . Disclaimer: 12/28/2017 Comment   Final   Comment: (NOTE) This test was developed and its performance characteristics determined by [facility]. It has not been cleared or approved by the Food and Drug Administration. Performed At: [facility] [place], [place] [id number] Rush Farmer MD  ZM:[id number] Performed at [facility], [address] [place], [place] [address]   . Sodium 12/28/2017 132* 135 - 145 mmol/L Final  . Potassium 12/28/2017 3.6  3.5 - 5.1 mmol/L Final  . Chloride 12/28/2017 101  101 - 111 mmol/L Final  . CO2 12/28/2017 23  22 - 32 mmol/L Final  . Glucose, Bld 12/28/2017 116* 65 - 99 mg/dL Final  . BUN 12/28/2017 7  6 - 20 mg/dL Final  . Creatinine, Ser 12/28/2017 0.52  0.44 - 1.00 mg/dL Final  . Calcium 12/28/2017 8.7* 8.9 - 10.3 mg/dL Final  . GFR calc non Af Amer 12/28/2017 >60  >60 mL/min Final  . GFR calc Af Amer 12/28/2017 >60  >60 mL/min Final   Comment: (NOTE) The eGFR has been calculated using the CKD EPI equation. This calculation has not been validated in all clinical situations. eGFR's persistently <60 mL/min signify possible Chronic Kidney Disease.   Georgiann Hahn gap 12/28/2017 8  5 - 15 Final   Performed at [facility], [address] [place], [place] [address]  . Retic Ct Pct  12/28/2017 1.8  0.4 - 3.1 % Final  . RBC. 12/28/2017 4.59  3.80 - 5.20 MIL/uL Final  . Retic Count, Absolute 12/28/2017 82.6  19.0 - 183.0 K/uL Final   Performed at [facility], [address] [place], [place] [address]  . WBC 12/28/2017 15.9* 3.6 - 11.0 K/uL Final  . RBC 12/28/2017 4.60  3.80 - 5.20 MIL/uL Final  . Hemoglobin 12/28/2017 9.9* 12.0 - 16.0 g/dL Final  . HCT 12/28/2017 31.1* 35.0 - 47.0 % Final  . MCV 12/28/2017 67.6* 80.0 - 100.0 fL Final  . MCH 12/28/2017 21.6* 26.0 - 34.0 pg Final  . MCHC 12/28/2017 31.9* 32.0 - 36.0 g/dL Final  . RDW 12/28/2017 15.1* 11.5 - 14.5 % Final  . Platelets 12/28/2017 389  150 - 440 K/uL Final  . Neutrophils Relative % 12/28/2017 77  % Final  . Neutro Abs 12/28/2017 12.4* 1.4 - 6.5 K/uL Final  . Lymphocytes Relative 12/28/2017 16  % Final  . Lymphs Abs 12/28/2017 2.5  1.0 - 3.6 K/uL Final  . Monocytes Relative 12/28/2017 6  % Final  . Monocytes Absolute 12/28/2017 0.9  0.2 - 0.9  K/uL Final  . Eosinophils Relative 12/28/2017 1  % Final  . Eosinophils Absolute 12/28/2017 0.1  0 - 0.7 K/uL Final  . Basophils Relative 12/28/2017 0  % Final  . Basophils Absolute 12/28/2017 0.0  0 - 0.1 K/uL Final   Performed at [facility], [address] [place], [place] [address]  . TSH 12/28/2017 0.893  0.350 - 4.500 uIU/mL Final   Comment: Performed by a 3rd Generation assay with a functional sensitivity of <=0.01 uIU/mL. Performed at [facility], [address], [place], [place] [address]   . Free T4 12/28/2017 0.56* 0.61 - 1.12 ng/dL Final   Comment: (NOTE) Biotin ingestion may interfere with free T4 tests. If the results are inconsistent with the TSH level, previous test results, or the clinical presentation, then consider biotin interference. If needed, order repeat testing after stopping biotin. Performed at [facility], [address] [place], [place] [address]     Assessment:  Dominique Dyer is a 37 y.o. female currently [redacted] weeks pregnant with microcytic anemia c/w iron deficiency.  She has a history of iron deficiency anemia.  Diet appears good.  She has ice pica.  She denies any bleeding.  She is unsure if she can absorb oral iron.  CBC on 10/12/2017 revealed a hematocrit of 32.4, hemoglobin 9.9, and MCV 67.  Ferritin was 13 with an iron saturation of 15% and a TIBC of 440 on 11/09/2017.  Labs on 12/21/2017 revealed a hematocrit of 32.0, hemoglobin 10.2, and MCV 68.1.  Ferritin was 6.  Iron saturation 3% with a TIBC of 517.  Reticulocytes 1%.  B12 level was low at 195.  Folate normal at 13.9.  She is 24 week G2P1, with an EDC of 04/11/2018.  Patient is scheduled to have a planned cesarean section.   Ferritin has been followed:  13 on 11/09/2017, and 6 on 12/21/2017.  She has been on oral iron since 12/22/2017.  Symptomatically, she is fatigued.  Exam is stable.  Plan: 1.  Labs today: CBC with differential, BMP, methylmalonic  acid, retic, TSH, free T4. 2.  Discussed iron deficiency anemia in pregnancy.  Patient is currently a 24-week G2P1 with an EDC of 04/11/2018.  She has been taking oral iron. Postpone IV iron unless unresponsive to oral iron and hemoglobin low.  3.  Discuss B12 deficiency. B12 level low at 195. Discuss oral B12 versus injections.  Follow-up with obstetrics-gynecology (Dr Marcelline Mates or Dr Amalia Hailey).  Discuss oral B12 unless gynecology wishes her to pursue injections.  If oral B12 pursued, recheck levels in 1 month. 4.  Follow-up with gynecology regarding tachycardia. 5.  RTC in 1 month for MD assessment and labs (CBC with diff, ferritin, retic, B12-day before).   Lequita Asal, MD  12/28/2017, 4:35 PM

## 2017-12-28 ENCOUNTER — Encounter: Payer: Self-pay | Admitting: Hematology and Oncology

## 2017-12-28 ENCOUNTER — Inpatient Hospital Stay: Payer: Medicaid Other

## 2017-12-28 ENCOUNTER — Other Ambulatory Visit: Payer: Self-pay | Admitting: Hematology and Oncology

## 2017-12-28 ENCOUNTER — Inpatient Hospital Stay (HOSPITAL_BASED_OUTPATIENT_CLINIC_OR_DEPARTMENT_OTHER): Payer: Medicaid Other | Admitting: Hematology and Oncology

## 2017-12-28 VITALS — BP 129/82 | HR 101 | Temp 98.1°F | Resp 18 | Wt 230.2 lb

## 2017-12-28 DIAGNOSIS — Z809 Family history of malignant neoplasm, unspecified: Secondary | ICD-10-CM | POA: Diagnosis not present

## 2017-12-28 DIAGNOSIS — Z79899 Other long term (current) drug therapy: Secondary | ICD-10-CM

## 2017-12-28 DIAGNOSIS — E538 Deficiency of other specified B group vitamins: Secondary | ICD-10-CM

## 2017-12-28 DIAGNOSIS — Z7189 Other specified counseling: Secondary | ICD-10-CM | POA: Insufficient documentation

## 2017-12-28 DIAGNOSIS — F5089 Other specified eating disorder: Secondary | ICD-10-CM | POA: Diagnosis not present

## 2017-12-28 DIAGNOSIS — R000 Tachycardia, unspecified: Secondary | ICD-10-CM

## 2017-12-28 DIAGNOSIS — F4310 Post-traumatic stress disorder, unspecified: Secondary | ICD-10-CM | POA: Diagnosis not present

## 2017-12-28 DIAGNOSIS — F319 Bipolar disorder, unspecified: Secondary | ICD-10-CM

## 2017-12-28 DIAGNOSIS — G2581 Restless legs syndrome: Secondary | ICD-10-CM

## 2017-12-28 DIAGNOSIS — R42 Dizziness and giddiness: Secondary | ICD-10-CM | POA: Diagnosis not present

## 2017-12-28 DIAGNOSIS — D649 Anemia, unspecified: Secondary | ICD-10-CM

## 2017-12-28 DIAGNOSIS — D509 Iron deficiency anemia, unspecified: Secondary | ICD-10-CM | POA: Diagnosis not present

## 2017-12-28 DIAGNOSIS — R5383 Other fatigue: Secondary | ICD-10-CM | POA: Diagnosis not present

## 2017-12-28 DIAGNOSIS — Z7982 Long term (current) use of aspirin: Secondary | ICD-10-CM

## 2017-12-28 LAB — CBC WITH DIFFERENTIAL/PLATELET
Basophils Absolute: 0 10*3/uL (ref 0–0.1)
Basophils Relative: 0 %
Eosinophils Absolute: 0.1 10*3/uL (ref 0–0.7)
Eosinophils Relative: 1 %
HCT: 31.1 % — ABNORMAL LOW (ref 35.0–47.0)
Hemoglobin: 9.9 g/dL — ABNORMAL LOW (ref 12.0–16.0)
Lymphocytes Relative: 16 %
Lymphs Abs: 2.5 10*3/uL (ref 1.0–3.6)
MCH: 21.6 pg — ABNORMAL LOW (ref 26.0–34.0)
MCHC: 31.9 g/dL — ABNORMAL LOW (ref 32.0–36.0)
MCV: 67.6 fL — ABNORMAL LOW (ref 80.0–100.0)
Monocytes Absolute: 0.9 10*3/uL (ref 0.2–0.9)
Monocytes Relative: 6 %
Neutro Abs: 12.4 10*3/uL — ABNORMAL HIGH (ref 1.4–6.5)
Neutrophils Relative %: 77 %
Platelets: 389 10*3/uL (ref 150–440)
RBC: 4.6 MIL/uL (ref 3.80–5.20)
RDW: 15.1 % — ABNORMAL HIGH (ref 11.5–14.5)
WBC: 15.9 10*3/uL — ABNORMAL HIGH (ref 3.6–11.0)

## 2017-12-28 LAB — BASIC METABOLIC PANEL
ANION GAP: 8 (ref 5–15)
BUN: 7 mg/dL (ref 6–20)
CO2: 23 mmol/L (ref 22–32)
Calcium: 8.7 mg/dL — ABNORMAL LOW (ref 8.9–10.3)
Chloride: 101 mmol/L (ref 101–111)
Creatinine, Ser: 0.52 mg/dL (ref 0.44–1.00)
GFR calc Af Amer: >60 mL/min (ref >60)
GLUCOSE: 116 mg/dL — AB (ref 65–99)
POTASSIUM: 3.6 mmol/L (ref 3.5–5.1)
SODIUM: 132 mmol/L — AB (ref 135–145)

## 2017-12-28 LAB — T4, FREE: Free T4: 0.56 ng/dL — ABNORMAL LOW (ref 0.61–1.12)

## 2017-12-28 LAB — RETICULOCYTES
RBC.: 4.59 MIL/uL (ref 3.80–5.20)
Retic Count, Absolute: 82.6 10*3/uL (ref 19.0–183.0)
Retic Ct Pct: 1.8 % (ref 0.4–3.1)

## 2017-12-28 LAB — TSH: TSH: 0.893 u[IU]/mL (ref 0.350–4.500)

## 2017-12-28 NOTE — Progress Notes (Signed)
Patient states for past 2 days she has been a little dizzy and HR elevated.  States she feels like she is dehydrated.   HR 101 today. SOB with any exertion.

## 2017-12-30 LAB — METHYLMALONIC ACID, SERUM: Methylmalonic Acid, Quantitative: 115 nmol/L (ref 0–378)

## 2017-12-31 ENCOUNTER — Telehealth: Payer: Self-pay | Admitting: Hematology and Oncology

## 2017-12-31 NOTE — Telephone Encounter (Signed)
Re:  Low B12  Discussed plan to start oral B12.  Spoke with on call OB-GYN yesterday.  Reviewed that I have been unable to talk to Dr Rubie Maid secondary to missed messages.  She stated that she was going to see Dr Amalia Hailey at [facility] on Wednesday, 01/04/2018.  I will try to reach Dr. Amalia Hailey on Monday.  If ok with Dr. Amalia Hailey, B12 injections will be started.  Lequita Asal, MD

## 2018-01-02 ENCOUNTER — Encounter: Payer: Self-pay | Admitting: Obstetrics and Gynecology

## 2018-01-02 ENCOUNTER — Encounter: Payer: Self-pay | Admitting: Hematology and Oncology

## 2018-01-03 ENCOUNTER — Telehealth: Payer: Self-pay | Admitting: Obstetrics and Gynecology

## 2018-01-03 ENCOUNTER — Encounter: Payer: Self-pay | Admitting: Hematology and Oncology

## 2018-01-03 NOTE — Telephone Encounter (Signed)
The patient called and stated that she was supposed to receive a call from our office according to the [facility] due to her experiencing vomiting and diarreah, The patient also stated that she is very concerned, No other information was disclosed. Please advise.

## 2018-01-03 NOTE — Telephone Encounter (Signed)
Pt was called and informed that Dr. Amalia Hailey was working on her case this evening during lunch and he would give her a call and inform her of the results. Pt stated that she was more concerned about her blood work and her having tachycardia. Pt has an appt tomorrow at 11:15 to see Dr. Amalia Hailey.

## 2018-01-04 ENCOUNTER — Encounter: Payer: Self-pay | Admitting: Obstetrics and Gynecology

## 2018-01-04 ENCOUNTER — Ambulatory Visit (INDEPENDENT_AMBULATORY_CARE_PROVIDER_SITE_OTHER): Payer: Medicaid Other | Admitting: Obstetrics and Gynecology

## 2018-01-04 ENCOUNTER — Encounter: Payer: Medicaid Other | Admitting: Obstetrics and Gynecology

## 2018-01-04 VITALS — BP 125/74 | HR 120 | Wt 223.4 lb

## 2018-01-04 DIAGNOSIS — O09522 Supervision of elderly multigravida, second trimester: Secondary | ICD-10-CM

## 2018-01-04 DIAGNOSIS — R000 Tachycardia, unspecified: Secondary | ICD-10-CM

## 2018-01-04 LAB — POCT URINALYSIS DIPSTICK
BILIRUBIN UA: NEGATIVE
Glucose, UA: NEGATIVE
Ketones, UA: NEGATIVE
Leukocytes, UA: NEGATIVE
NITRITE UA: NEGATIVE
PH UA: 6.5 (ref 5.0–8.0)
RBC UA: NEGATIVE
Spec Grav, UA: 1.015 (ref 1.010–1.025)
UROBILINOGEN UA: 0.2 U/dL

## 2018-01-04 NOTE — Progress Notes (Signed)
ROB: Patient taking medications as directed including aspirin and iron.  Mildly anemic with pregnancy.  Normal TSH.  Patient complains of persistent symptomatic tachycardia.  Refer to cardiology.  1 hour GCT next visit.

## 2018-01-04 NOTE — Progress Notes (Signed)
ROB-pt heart rate is elevated for the last x 1 week.  Really bad headaches and can feel pressure in the back of her head with headaches eye vision changes.

## 2018-01-10 ENCOUNTER — Encounter (INDEPENDENT_AMBULATORY_CARE_PROVIDER_SITE_OTHER): Payer: Self-pay

## 2018-01-12 NOTE — Addendum Note (Signed)
Addended by: Finis Bud on: 01/12/2018 10:04 AM   Modules accepted: Orders

## 2018-01-13 ENCOUNTER — Encounter (INDEPENDENT_AMBULATORY_CARE_PROVIDER_SITE_OTHER): Payer: Self-pay

## 2018-01-24 ENCOUNTER — Inpatient Hospital Stay: Payer: Medicaid Other | Attending: Hematology and Oncology

## 2018-01-24 DIAGNOSIS — Z79899 Other long term (current) drug therapy: Secondary | ICD-10-CM | POA: Insufficient documentation

## 2018-01-24 DIAGNOSIS — Z933 Colostomy status: Secondary | ICD-10-CM | POA: Insufficient documentation

## 2018-01-24 DIAGNOSIS — F319 Bipolar disorder, unspecified: Secondary | ICD-10-CM | POA: Diagnosis not present

## 2018-01-24 DIAGNOSIS — Z809 Family history of malignant neoplasm, unspecified: Secondary | ICD-10-CM | POA: Diagnosis not present

## 2018-01-24 DIAGNOSIS — Z7982 Long term (current) use of aspirin: Secondary | ICD-10-CM | POA: Diagnosis not present

## 2018-01-24 DIAGNOSIS — D509 Iron deficiency anemia, unspecified: Secondary | ICD-10-CM | POA: Insufficient documentation

## 2018-01-24 DIAGNOSIS — I10 Essential (primary) hypertension: Secondary | ICD-10-CM | POA: Insufficient documentation

## 2018-01-24 DIAGNOSIS — E538 Deficiency of other specified B group vitamins: Secondary | ICD-10-CM | POA: Diagnosis not present

## 2018-01-24 LAB — CBC WITH DIFFERENTIAL/PLATELET
Basophils Absolute: 0 10*3/uL (ref 0–0.1)
Basophils Relative: 0 %
Eosinophils Absolute: 0.1 10*3/uL (ref 0–0.7)
Eosinophils Relative: 1 %
HCT: 30.7 % — ABNORMAL LOW (ref 35.0–47.0)
Hemoglobin: 9.8 g/dL — ABNORMAL LOW (ref 12.0–16.0)
Lymphocytes Relative: 14 %
Lymphs Abs: 2.6 10*3/uL (ref 1.0–3.6)
MCH: 21.5 pg — ABNORMAL LOW (ref 26.0–34.0)
MCHC: 32 g/dL (ref 32.0–36.0)
MCV: 67.3 fL — ABNORMAL LOW (ref 80.0–100.0)
Monocytes Absolute: 0.9 10*3/uL (ref 0.2–0.9)
Monocytes Relative: 5 %
Neutro Abs: 14.6 10*3/uL — ABNORMAL HIGH (ref 1.4–6.5)
Neutrophils Relative %: 80 %
Platelets: 382 10*3/uL (ref 150–440)
RBC: 4.56 MIL/uL (ref 3.80–5.20)
RDW: 15.7 % — ABNORMAL HIGH (ref 11.5–14.5)
WBC: 18.2 10*3/uL — ABNORMAL HIGH (ref 3.6–11.0)

## 2018-01-24 LAB — FERRITIN: Ferritin: 12 ng/mL (ref 11–307)

## 2018-01-24 LAB — RETICULOCYTES
RBC.: 4.58 MIL/uL (ref 3.80–5.20)
Retic Count, Absolute: 77.9 10*3/uL (ref 19.0–183.0)
Retic Ct Pct: 1.7 % (ref 0.4–3.1)

## 2018-01-24 LAB — VITAMIN B12: Vitamin B-12: 177 pg/mL — ABNORMAL LOW (ref 180–914)

## 2018-01-25 ENCOUNTER — Inpatient Hospital Stay (HOSPITAL_BASED_OUTPATIENT_CLINIC_OR_DEPARTMENT_OTHER): Payer: Medicaid Other | Admitting: Hematology and Oncology

## 2018-01-25 ENCOUNTER — Inpatient Hospital Stay: Payer: Medicaid Other

## 2018-01-25 ENCOUNTER — Encounter: Payer: Self-pay | Admitting: Hematology and Oncology

## 2018-01-25 ENCOUNTER — Encounter: Payer: Self-pay | Admitting: Obstetrics and Gynecology

## 2018-01-25 ENCOUNTER — Ambulatory Visit: Payer: Medicaid Other

## 2018-01-25 VITALS — BP 124/83 | HR 111 | Temp 98.2°F | Resp 18 | Wt 238.8 lb

## 2018-01-25 DIAGNOSIS — Z933 Colostomy status: Secondary | ICD-10-CM | POA: Diagnosis not present

## 2018-01-25 DIAGNOSIS — Z7982 Long term (current) use of aspirin: Secondary | ICD-10-CM | POA: Diagnosis not present

## 2018-01-25 DIAGNOSIS — I10 Essential (primary) hypertension: Secondary | ICD-10-CM | POA: Diagnosis not present

## 2018-01-25 DIAGNOSIS — O99012 Anemia complicating pregnancy, second trimester: Secondary | ICD-10-CM

## 2018-01-25 DIAGNOSIS — D72829 Elevated white blood cell count, unspecified: Secondary | ICD-10-CM

## 2018-01-25 DIAGNOSIS — D509 Iron deficiency anemia, unspecified: Secondary | ICD-10-CM | POA: Diagnosis not present

## 2018-01-25 DIAGNOSIS — E538 Deficiency of other specified B group vitamins: Secondary | ICD-10-CM

## 2018-01-25 DIAGNOSIS — Z79899 Other long term (current) drug therapy: Secondary | ICD-10-CM

## 2018-01-25 DIAGNOSIS — F319 Bipolar disorder, unspecified: Secondary | ICD-10-CM

## 2018-01-25 DIAGNOSIS — Z809 Family history of malignant neoplasm, unspecified: Secondary | ICD-10-CM

## 2018-01-25 LAB — URINALYSIS, COMPLETE (UACMP) WITH MICROSCOPIC
BILIRUBIN URINE: NEGATIVE
Bacteria, UA: NONE SEEN
Glucose, UA: NEGATIVE mg/dL
Hgb urine dipstick: NEGATIVE
KETONES UR: NEGATIVE mg/dL
LEUKOCYTES UA: NEGATIVE
NITRITE: NEGATIVE
Protein, ur: NEGATIVE mg/dL
RBC / HPF: NONE SEEN RBC/hpf (ref 0–5)
Specific Gravity, Urine: 1.01 (ref 1.005–1.030)
pH: 7 (ref 5.0–8.0)

## 2018-01-25 LAB — PATHOLOGIST SMEAR REVIEW

## 2018-01-25 MED ORDER — CYANOCOBALAMIN 1000 MCG/ML IJ SOLN
1000.0000 ug | Freq: Once | INTRAMUSCULAR | Status: AC
  Administered 2018-01-25: 1000 ug via INTRAMUSCULAR

## 2018-01-25 MED ORDER — CYANOCOBALAMIN 1000 MCG/ML IJ SOLN
INTRAMUSCULAR | Status: AC
  Filled 2018-01-25: qty 1

## 2018-01-25 NOTE — Progress Notes (Signed)
[facility] day:  01/25/2018   Chief Complaint: Dominique Dyer is a 37 y.o. female currently [redacted] weeks pregnant with iron deficiency anemia and B12 deficiency who is seen for a 1 month assessment.   HPI: Patient was last seen in the hematology clinic on 12/28/2017.  At that time, she was fatigued.  Exam was stable.  We discussed continuation of oral iron and postponing IV iron.  We discussed oral B12 supplementation.  Labs on 12/28/2017 included a hematocrit of 31.1, hemoglobin 9.9, MCV 67.6, platelets 389,000, WBC 15,900.  Retic was 1.8%.  TSH was 0.893 and free T4 was 0.56 (0.61-1.12).  Methylmalonic acid was 115 (normal).  She was to follow-up with Dr. Amalia Hailey or Dr. Marcelline Mates.  Last visit with OB/GYN was 2 weeks ago. Patient was referred to cardiology, however patient cannot be seen for 2 months.   Labs on 01/24/2018 revealed a hematocrit of 30.7, hemoglobin 9.8, MCV 67.3, platelets 382,000, WBC 18,200 with an ANC of 14,600.  Retic was 1.7%.  During the interim, patient continues to be fatigued.  Nausea has improved overall.  She continues to be TACHYcardic to the 110s. Patient continues on oral B12 daily, and oral iron with a source of vitamin C TID.  She has occasional hard stools, however states, "I wouldn't classify it as constipation". Patient is eating well. She is trying to eat more steak.  Weight is up 8 pounds.   Patient is 27 weeks. EDC is 04/11/2018. Patient denies pain in the clinic today.    Past Medical History:  Diagnosis Date  . Anxiety   . Bipolar disorder ([place])   . Migraines   . Panic attacks     Past Surgical History:  Procedure Laterality Date  . CESAREAN SECTION    . COLPOSCOPY  2006    Family History  Problem Relation Age of Onset  . Cancer Paternal Uncle   . Cancer Maternal Grandfather     Social History:  reports that she has never smoked. She has never used smokeless tobacco. She reports that she has current or  past drug history. Drug: Marijuana. She reports that she does not drink alcohol.  Patient is originally from [place], [place]. She lives in [place].  Patient is unemployed. Patient denies known exposures to radiation on toxins.  The patient is alone today.   Allergies:  Allergies  Allergen Reactions  . Aspirin Nausea Only and Other (See Comments)  . Naproxen Nausea Only, Other (See Comments) and Nausea And Vomiting  . Sumatriptan Succinate Rash    tingling    Current Medications: Current Outpatient Medications  Medication Sig Dispense Refill  . ALPRAZolam (XANAX) 1 MG tablet Take 1.5 mg by mouth at bedtime as needed for anxiety.    Marland Kitchen aspirin EC 81 MG tablet Take 1 tablet (81 mg total) by mouth daily. Take after 12 weeks for prevention of preeclampssia later in pregnancy 300 tablet 2  . cetirizine (ZYRTEC) 10 MG tablet Take 10 mg by mouth daily.    . flintstones complete (FLINTSTONES) 60 MG chewable tablet Chew 1 tablet by mouth daily.    Marland Kitchen omeprazole (PRILOSEC) 10 MG capsule Take 10 mg by mouth daily.    . ondansetron (ZOFRAN ODT) 4 MG disintegrating tablet Take 1 tablet (4 mg total) by mouth every 6 (six) hours as needed for nausea. 20 tablet 0   No current facility-administered medications for this visit.    Facility-Administered Medications Ordered in  Other Visits  Medication Dose Route Frequency Provider Last Rate Last Dose  . cyanocobalamin ((VITAMIN B-12)) injection 1,000 mcg  1,000 mcg Intramuscular Once Karen Kitchens, NP        Review of Systems  Constitutional: Positive for malaise/fatigue and weight loss (weight up 8 pounds). Negative for diaphoresis and fever.       27 week G2P1; [facility] 04/11/2018  HENT: Negative.   Eyes: Negative.   Respiratory: Positive for shortness of breath (exertional). Negative for cough, hemoptysis and sputum production.   Cardiovascular: Negative for chest pain, palpitations, orthopnea, leg swelling and PND.       TACHYcardia; referred to  cardiology  Gastrointestinal: Positive for constipation and nausea (intermittent). Negative for abdominal pain, blood in stool, diarrhea, melena and vomiting.  Genitourinary: Negative for dysuria, frequency, hematuria and urgency.  Musculoskeletal: Negative for back pain, falls, joint pain and myalgias.  Skin: Negative for itching and rash.  Neurological: Negative for dizziness, tremors, weakness and headaches.  Endo/Heme/Allergies: Does not bruise/bleed easily.  Psychiatric/Behavioral: Negative for depression, memory loss and suicidal ideas. The patient is not nervous/anxious and does not have insomnia.   All other systems reviewed and are negative.   Physical Exam: Blood pressure 124/83, pulse (!) 111, temperature 98.2 F (36.8 C), temperature source Tympanic, resp. rate 18, weight 238 lb 12.1 oz (108.3 kg), last menstrual period 07/12/2017, SpO2 99 %. GENERAL:  Well developed, well nourished, woman sitting comfortably in the exam room in no acute distress. MENTAL STATUS:  Alert and oriented to person, place and time. HEAD:  Long dark hair.  Normocephalic, atraumatic, face symmetric, no Cushingoid features. EYES:  Brown eyes.  Pupils equal round and reactive to light and accomodation.  No conjunctivitis or scleral icterus. ENT:  Oropharynx clear without lesion.  Tongue normal. Mucous membranes moist.  RESPIRATORY:  Clear to auscultation without rales, wheezes or rhonchi. CARDIOVASCULAR:  Regular rate and rhythm without murmur, rub or gallop. ABDOMEN:  Pregnant.  Soft, non-tender, with active bowel sounds, and no hepatosplenomegaly.  SKIN:  No rashes, ulcers or lesions. EXTREMITIES: No edema, no skin discoloration or tenderness.  No palpable cords. LYMPH NODES: No palpable cervical, supraclavicular, axillary or inguinal adenopathy  NEUROLOGICAL: Unremarkable. PSYCH:  Appropriate.    Appointment on 01/24/2018  Component Date Value Ref Range Status  . Vitamin B-12 01/24/2018 177* 180 -  914 pg/mL Final   Comment: (NOTE) This assay is not validated for testing neonatal or myeloproliferative syndrome specimens for Vitamin B12 levels. Performed at [facility], [place] [address], [place], [place] [address]   . Retic Ct Pct 01/24/2018 1.7  0.4 - 3.1 % Final  . RBC. 01/24/2018 4.58  3.80 - 5.20 MIL/uL Final  . Retic Count, Absolute 01/24/2018 77.9  19.0 - 183.0 K/uL Final   Performed at [facility], [address] [place], [place] [address]  . Ferritin 01/24/2018 12  11 - 307 ng/mL Final   Performed at [facility], [address] [place], [place] [address]  . WBC 01/24/2018 18.2* 3.6 - 11.0 K/uL Final  . RBC 01/24/2018 4.56  3.80 - 5.20 MIL/uL Final  . Hemoglobin 01/24/2018 9.8* 12.0 - 16.0 g/dL Final  . HCT 01/24/2018 30.7* 35.0 - 47.0 % Final  . MCV 01/24/2018 67.3* 80.0 - 100.0 fL Final  . MCH 01/24/2018 21.5* 26.0 - 34.0 pg Final  . MCHC 01/24/2018 32.0  32.0 - 36.0 g/dL Final  . RDW 01/24/2018 15.7* 11.5 - 14.5 % Final  .  Platelets 01/24/2018 382  150 - 440 K/uL Final  . Neutrophils Relative % 01/24/2018 80  % Final  . Neutro Abs 01/24/2018 14.6* 1.4 - 6.5 K/uL Final  . Lymphocytes Relative 01/24/2018 14  % Final  . Lymphs Abs 01/24/2018 2.6  1.0 - 3.6 K/uL Final  . Monocytes Relative 01/24/2018 5  % Final  . Monocytes Absolute 01/24/2018 0.9  0.2 - 0.9 K/uL Final  . Eosinophils Relative 01/24/2018 1  % Final  . Eosinophils Absolute 01/24/2018 0.1  0 - 0.7 K/uL Final  . Basophils Relative 01/24/2018 0  % Final  . Basophils Absolute 01/24/2018 0.0  0 - 0.1 K/uL Final   Performed at [facility], [address], [place], [place] [address]    Assessment:  CLOTILDA HAFER is a 37 y.o. female currently [redacted] weeks pregnant with iron deficiency anemia.  Diet appears good.  She has ice pica.  She denies any bleeding.  She is on oral iron.  CBC on 10/12/2017 revealed a hematocrit of 32.4, hemoglobin 9.9, and MCV 67.  Ferritin was 13  with an iron saturation of 15% and a TIBC of 440 on 11/09/2017.  Labs on 12/21/2017 revealed a hematocrit of 32.0, hemoglobin 10.2, and MCV 68.1.  Ferritin was 6.  Iron saturation 3% with a TIBC of 517.  Reticulocytes 1%.  B12 level was low at 195.  Folate normal at 13.9.  She is 27 week G2P1, with an EDC of 04/11/2018.  Patient is scheduled to have a planned cesarean section.   Ferritin has been followed:  13 on 11/09/2017, 6 on 12/21/2017, and 12 on 01/24/2018.  She has been on oral iron since 12/22/2017.  She has B12 deficiency.  B12 was 195 on 12/21/2017 and 177 on 01/24/2018.  She has been on oral B12.  Symptomatically, she remains fatigued.  Exam is stable.  Plan: 1.  Review labs from yesterday. 2.  Contact Dr.Evans to discuss iron and B12 replacement- done.    3.  Discuss leukocytosis. Discussed with OB/GYN. Will send peripheral smear for review and BCR-ABL FISH. With pregnancy, patient is susceptible to UTI. Will send UA.  4.  Discuss TACHYcardia. Patient distantly scheduled for cardiology consult. OB/GYN to call to attempt to expedite appointment.  5.  Discuss low B12 level. Discussed with GYN and they are ok with parenteral supplementation.Schedule weekly B12 x 6, then monthly.  6.  Discuss iron deficiency anemia in pregnancy.  Patient is currently 34-week G2P1 with an [place] of 04/11/2018.  Continue oral iron at this time. Discussed Venofer at next visit.  Discussed with GYN. 7.  RTC in 2 weeks for MD assessment and labs (CBC with diff, ferritin-day before), B12, and +/- Venofer   Honor Loh, NP  01/25/2018, 11:09 AM   I saw and evaluated the patient, participating in the key portions of the service and reviewing pertinent diagnostic studies and records.  I reviewed the nurse practitioner's note and agree with the findings and the plan.  The assessment and plan were discussed with the patient.  Several questions were asked by the patient and answered.   Nolon Stalls,  MD 01/25/2018,11:09 AM

## 2018-01-25 NOTE — Patient Instructions (Signed)
Cyanocobalamin, Vitamin B12 injection What is this medicine? CYANOCOBALAMIN (sye an oh koe BAL a min) is a man made form of vitamin B12. Vitamin B12 is used in the growth of healthy blood cells, nerve cells, and proteins in the body. It also helps with the metabolism of fats and carbohydrates. This medicine is used to treat people who can not absorb vitamin B12. This medicine may be used for other purposes; ask your health care provider or pharmacist if you have questions. COMMON BRAND NAME(S): B-12 Compliance Kit, B-12 Injection Kit, Cyomin, LA-12, Nutri-Twelve, Physicians EZ Use B-12, Primabalt What should I tell my health care provider before I take this medicine? They need to know if you have any of these conditions: -kidney disease -Leber's disease -megaloblastic anemia -an unusual or allergic reaction to cyanocobalamin, cobalt, other medicines, foods, dyes, or preservatives -pregnant or trying to get pregnant -breast-feeding How should I use this medicine? This medicine is injected into a muscle or deeply under the skin. It is usually given by a health care professional in a clinic or doctor's office. However, your doctor may teach you how to inject yourself. Follow all instructions. Talk to your pediatrician regarding the use of this medicine in children. Special care may be needed. Overdosage: If you think you have taken too much of this medicine contact a poison control center or emergency room at once. NOTE: This medicine is only for you. Do not share this medicine with others. What if I miss a dose? If you are given your dose at a clinic or doctor's office, call to reschedule your appointment. If you give your own injections and you miss a dose, take it as soon as you can. If it is almost time for your next dose, take only that dose. Do not take double or extra doses. What may interact with this medicine? -colchicine -heavy alcohol intake This list may not describe all possible  interactions. Give your health care provider a list of all the medicines, herbs, non-prescription drugs, or dietary supplements you use. Also tell them if you smoke, drink alcohol, or use illegal drugs. Some items may interact with your medicine. What should I watch for while using this medicine? Visit your doctor or health care professional regularly. You may need blood work done while you are taking this medicine. You may need to follow a special diet. Talk to your doctor. Limit your alcohol intake and avoid smoking to get the best benefit. What side effects may I notice from receiving this medicine? Side effects that you should report to your doctor or health care professional as soon as possible: -allergic reactions like skin rash, itching or hives, swelling of the face, lips, or tongue -blue tint to skin -chest tightness, pain -difficulty breathing, wheezing -dizziness -red, swollen painful area on the leg Side effects that usually do not require medical attention (report to your doctor or health care professional if they continue or are bothersome): -diarrhea -headache This list may not describe all possible side effects. Call your doctor for medical advice about side effects. You may report side effects to FDA at 1-800-FDA-1088. Where should I keep my medicine? Keep out of the reach of children. Store at room temperature between 15 and 30 degrees C (59 and 85 degrees F). Protect from light. Throw away any unused medicine after the expiration date. NOTE: This sheet is a summary. It may not cover all possible information. If you have questions about this medicine, talk to your doctor, pharmacist, or   health care provider.  2018 Elsevier/Gold Standard (2007-12-18 22:10:20)  

## 2018-01-25 NOTE — Progress Notes (Signed)
Patient states she has occasional N/V - most recent yesterday morning.  Offers no complaints today.

## 2018-01-26 ENCOUNTER — Other Ambulatory Visit: Payer: Medicaid Other

## 2018-01-26 ENCOUNTER — Encounter: Payer: Medicaid Other | Admitting: Obstetrics and Gynecology

## 2018-01-31 ENCOUNTER — Inpatient Hospital Stay: Payer: Medicaid Other

## 2018-01-31 ENCOUNTER — Encounter: Payer: Self-pay | Admitting: Obstetrics and Gynecology

## 2018-01-31 VITALS — BP 119/79 | HR 102 | Temp 97.7°F | Resp 18

## 2018-01-31 DIAGNOSIS — D509 Iron deficiency anemia, unspecified: Secondary | ICD-10-CM | POA: Diagnosis not present

## 2018-01-31 MED ORDER — CYANOCOBALAMIN 1000 MCG/ML IJ SOLN
1000.0000 ug | Freq: Once | INTRAMUSCULAR | Status: AC
  Administered 2018-01-31: 1000 ug via INTRAMUSCULAR

## 2018-02-01 ENCOUNTER — Ambulatory Visit (INDEPENDENT_AMBULATORY_CARE_PROVIDER_SITE_OTHER): Payer: Self-pay | Admitting: Obstetrics and Gynecology

## 2018-02-01 ENCOUNTER — Other Ambulatory Visit: Payer: Self-pay

## 2018-02-01 VITALS — BP 114/66 | HR 99 | Wt 239.6 lb

## 2018-02-01 DIAGNOSIS — O99013 Anemia complicating pregnancy, third trimester: Secondary | ICD-10-CM

## 2018-02-01 DIAGNOSIS — R000 Tachycardia, unspecified: Secondary | ICD-10-CM

## 2018-02-01 DIAGNOSIS — O99210 Obesity complicating pregnancy, unspecified trimester: Secondary | ICD-10-CM

## 2018-02-01 DIAGNOSIS — F1290 Cannabis use, unspecified, uncomplicated: Secondary | ICD-10-CM

## 2018-02-01 DIAGNOSIS — E538 Deficiency of other specified B group vitamins: Secondary | ICD-10-CM

## 2018-02-01 DIAGNOSIS — Z3483 Encounter for supervision of other normal pregnancy, third trimester: Secondary | ICD-10-CM

## 2018-02-01 DIAGNOSIS — Z23 Encounter for immunization: Secondary | ICD-10-CM

## 2018-02-01 LAB — POCT URINALYSIS DIPSTICK
Bilirubin, UA: NEGATIVE
Blood, UA: NEGATIVE
GLUCOSE UA: NEGATIVE
KETONES UA: NEGATIVE
Leukocytes, UA: NEGATIVE
NITRITE UA: NEGATIVE
PROTEIN UA: NEGATIVE
SPEC GRAV UA: 1.01 (ref 1.010–1.025)
Urobilinogen, UA: 0.2 E.U./dL
pH, UA: 7.5 (ref 5.0–8.0)

## 2018-02-01 MED ORDER — TETANUS-DIPHTH-ACELL PERTUSSIS 5-2.5-18.5 LF-MCG/0.5 IM SUSP
0.5000 mL | Freq: Once | INTRAMUSCULAR | Status: AC
  Administered 2018-02-01: 0.5 mL via INTRAMUSCULAR

## 2018-02-01 NOTE — Progress Notes (Addendum)
ROB: Doing well, no major concerns. For 28 week labs today.  Desires to bottle feed,  desires BTL for contraception. Reviewed all forms of birth control options available including abstinence; fertility period awareness methods; over the counter/barrier methods; hormonal contraceptive medication including pill, patch, ring, injection,contraceptive implant; hormonal and nonhormonal IUDs; permanent sterilization options including vasectomy and the various tubal sterilization modalities. Risks and benefits reviewed.  Questions were answered. Patient still desires BTL. Medicaid form completed today. For Tdap today, signed blood consent, discussed cord blood banking. Seen by Hematology last week, plans for iron infusions to begin in 3rd trimester for anemia not responding to oral iron therapy. Is also currently receiving B12 injections for deficiency.  Still pending Cardiology appointment (not scheduled until July) for tachycardia. Advised on limiting further weight gain in pregnancy (TWG 32 lbs) due to h/o obesity. H/o marijuana use (+ UDS) early in pregnancy. Will rescreen today. RTC in 2 weeks.

## 2018-02-01 NOTE — Progress Notes (Signed)
ROB-pt stated that she is still having a fast heart rate but not other concerns.

## 2018-02-01 NOTE — Addendum Note (Signed)
Addended by: Edwyna Shell on: 02/01/2018 10:27 AM   Modules accepted: Orders

## 2018-02-02 LAB — RPR: RPR Ser Ql: NONREACTIVE

## 2018-02-02 LAB — CBC
HEMATOCRIT: 31.6 % — AB (ref 34.0–46.6)
Hemoglobin: 9.8 g/dL — ABNORMAL LOW (ref 11.1–15.9)
MCH: 21.6 pg — AB (ref 26.6–33.0)
MCHC: 31 g/dL — AB (ref 31.5–35.7)
MCV: 70 fL — ABNORMAL LOW (ref 79–97)
Platelets: 409 10*3/uL — ABNORMAL HIGH (ref 150–379)
RBC: 4.54 x10E6/uL (ref 3.77–5.28)
RDW: 16.3 % — ABNORMAL HIGH (ref 12.3–15.4)
WBC: 17.1 10*3/uL — ABNORMAL HIGH (ref 3.4–10.8)

## 2018-02-02 LAB — GLUCOSE, 1 HOUR GESTATIONAL: GESTATIONAL DIABETES SCREEN: 121 mg/dL (ref 65–139)

## 2018-02-06 LAB — BCR-ABL1 FISH
CELLS ANALYZED: 200
Cells Counted: 200

## 2018-02-07 ENCOUNTER — Inpatient Hospital Stay: Payer: Medicaid Other

## 2018-02-07 DIAGNOSIS — D509 Iron deficiency anemia, unspecified: Secondary | ICD-10-CM

## 2018-02-07 LAB — DRUG PROFILE, UR, 9 DRUGS (LABCORP)
AMPHETAMINES, URINE: NEGATIVE ng/mL
Barbiturate Quant, Ur: NEGATIVE ng/mL
Benzodiazepine Quant, Ur: NEGATIVE
Cannabinoid Quant, Ur: POSITIVE — AB
Cocaine (Metab.): NEGATIVE ng/mL
METHADONE SCREEN, URINE: NEGATIVE ng/mL
Opiate Quant, Ur: NEGATIVE ng/mL
PCP QUANT UR: NEGATIVE ng/mL
Propoxyphene: NEGATIVE ng/mL

## 2018-02-07 LAB — CBC WITH DIFFERENTIAL/PLATELET
Basophils Absolute: 0.1 10*3/uL (ref 0–0.1)
Basophils Relative: 1 %
Eosinophils Absolute: 0.1 10*3/uL (ref 0–0.7)
Eosinophils Relative: 1 %
HCT: 28.9 % — ABNORMAL LOW (ref 35.0–47.0)
Hemoglobin: 9.1 g/dL — ABNORMAL LOW (ref 12.0–16.0)
Lymphocytes Relative: 14 %
Lymphs Abs: 2.5 10*3/uL (ref 1.0–3.6)
MCH: 21.2 pg — ABNORMAL LOW (ref 26.0–34.0)
MCHC: 31.7 g/dL — ABNORMAL LOW (ref 32.0–36.0)
MCV: 67 fL — ABNORMAL LOW (ref 80.0–100.0)
Monocytes Absolute: 1.1 10*3/uL — ABNORMAL HIGH (ref 0.2–0.9)
Monocytes Relative: 6 %
Neutro Abs: 14.5 10*3/uL — ABNORMAL HIGH (ref 1.4–6.5)
Neutrophils Relative %: 78 %
Platelets: 387 10*3/uL (ref 150–440)
RBC: 4.31 MIL/uL (ref 3.80–5.20)
RDW: 15.5 % — ABNORMAL HIGH (ref 11.5–14.5)
WBC: 18.3 10*3/uL — ABNORMAL HIGH (ref 3.6–11.0)

## 2018-02-07 LAB — FERRITIN: Ferritin: 11 ng/mL (ref 11–307)

## 2018-02-08 ENCOUNTER — Inpatient Hospital Stay: Payer: Medicaid Other

## 2018-02-08 ENCOUNTER — Encounter: Payer: Self-pay | Admitting: Hematology and Oncology

## 2018-02-08 ENCOUNTER — Inpatient Hospital Stay (HOSPITAL_BASED_OUTPATIENT_CLINIC_OR_DEPARTMENT_OTHER): Payer: Medicaid Other | Admitting: Hematology and Oncology

## 2018-02-08 VITALS — BP 116/66 | HR 105 | Resp 18

## 2018-02-08 VITALS — BP 117/66 | HR 106 | Temp 97.4°F | Resp 18 | Wt 244.7 lb

## 2018-02-08 DIAGNOSIS — D72829 Elevated white blood cell count, unspecified: Secondary | ICD-10-CM | POA: Insufficient documentation

## 2018-02-08 DIAGNOSIS — E538 Deficiency of other specified B group vitamins: Secondary | ICD-10-CM | POA: Diagnosis not present

## 2018-02-08 DIAGNOSIS — Z7982 Long term (current) use of aspirin: Secondary | ICD-10-CM

## 2018-02-08 DIAGNOSIS — D72828 Other elevated white blood cell count: Secondary | ICD-10-CM

## 2018-02-08 DIAGNOSIS — Z933 Colostomy status: Secondary | ICD-10-CM | POA: Diagnosis not present

## 2018-02-08 DIAGNOSIS — F319 Bipolar disorder, unspecified: Secondary | ICD-10-CM

## 2018-02-08 DIAGNOSIS — D509 Iron deficiency anemia, unspecified: Secondary | ICD-10-CM

## 2018-02-08 DIAGNOSIS — Z79899 Other long term (current) drug therapy: Secondary | ICD-10-CM | POA: Diagnosis not present

## 2018-02-08 DIAGNOSIS — I10 Essential (primary) hypertension: Secondary | ICD-10-CM

## 2018-02-08 DIAGNOSIS — Z809 Family history of malignant neoplasm, unspecified: Secondary | ICD-10-CM | POA: Diagnosis not present

## 2018-02-08 MED ORDER — SODIUM CHLORIDE 0.9 % IV SOLN
Freq: Once | INTRAVENOUS | Status: AC
  Administered 2018-02-08: 10:00:00 via INTRAVENOUS
  Filled 2018-02-08: qty 1000

## 2018-02-08 MED ORDER — CYANOCOBALAMIN 1000 MCG/ML IJ SOLN
1000.0000 ug | Freq: Once | INTRAMUSCULAR | Status: AC
Start: 2018-02-08 — End: 2018-02-08
  Administered 2018-02-08: 1000 ug via INTRAMUSCULAR

## 2018-02-08 MED ORDER — IRON SUCROSE 20 MG/ML IV SOLN
200.0000 mg | Freq: Once | INTRAVENOUS | Status: AC
  Administered 2018-02-08: 200 mg via INTRAVENOUS
  Filled 2018-02-08: qty 10

## 2018-02-08 MED ORDER — IRON SUCROSE 20 MG/ML IV SOLN
INTRAVENOUS | Status: AC
  Filled 2018-02-08: qty 10

## 2018-02-08 NOTE — Patient Instructions (Addendum)
Iron Sucrose injection What is this medicine? IRON SUCROSE (AHY ern SOO krohs) is an iron complex. Iron is used to make healthy red blood cells, which carry oxygen and nutrients throughout the body. This medicine is used to treat iron deficiency anemia in people with chronic kidney disease. This medicine may be used for other purposes; ask your health care provider or pharmacist if you have questions. COMMON BRAND NAME(S): Venofer What should I tell my health care provider before I take this medicine? They need to know if you have any of these conditions: -anemia not caused by low iron levels -heart disease -high levels of iron in the blood -kidney disease -liver disease -an unusual or allergic reaction to iron, other medicines, foods, dyes, or preservatives -pregnant or trying to get pregnant -breast-feeding How should I use this medicine? This medicine is for infusion into a vein. It is given by a health care professional in a hospital or clinic setting. Talk to your pediatrician regarding the use of this medicine in children. While this drug may be prescribed for children as young as 2 years for selected conditions, precautions do apply. Overdosage: If you think you have taken too much of this medicine contact a poison control center or emergency room at once. NOTE: This medicine is only for you. Do not share this medicine with others. What if I miss a dose? It is important not to miss your dose. Call your doctor or health care professional if you are unable to keep an appointment. What may interact with this medicine? Do not take this medicine with any of the following medications: -deferoxamine -dimercaprol -other iron products This medicine may also interact with the following medications: -chloramphenicol -deferasirox This list may not describe all possible interactions. Give your health care provider a list of all the medicines, herbs, non-prescription drugs, or dietary  supplements you use. Also tell them if you smoke, drink alcohol, or use illegal drugs. Some items may interact with your medicine. What should I watch for while using this medicine? Visit your doctor or healthcare professional regularly. Tell your doctor or healthcare professional if your symptoms do not start to get better or if they get worse. You may need blood work done while you are taking this medicine. You may need to follow a special diet. Talk to your doctor. Foods that contain iron include: whole grains/cereals, dried fruits, beans, or peas, leafy green vegetables, and organ meats (liver, kidney). What side effects may I notice from receiving this medicine? Side effects that you should report to your doctor or health care professional as soon as possible: -allergic reactions like skin rash, itching or hives, swelling of the face, lips, or tongue -breathing problems -changes in blood pressure -cough -fast, irregular heartbeat -feeling faint or lightheaded, falls -fever or chills -flushing, sweating, or hot feelings -joint or muscle aches/pains -seizures -swelling of the ankles or feet -unusually weak or tired Side effects that usually do not require medical attention (report to your doctor or health care professional if they continue or are bothersome): -diarrhea -feeling achy -headache -irritation at site where injected -nausea, vomiting -stomach upset -tiredness This list may not describe all possible side effects. Call your doctor for medical advice about side effects. You may report side effects to FDA at 1-800-FDA-1088. Where should I keep my medicine? This drug is given in a hospital or clinic and will not be stored at home. NOTE: This sheet is a summary. It may not cover all possible information. If   you have questions about this medicine, talk to your doctor, pharmacist, or health care provider.  2018 Elsevier/Gold Standard (2011-06-17 17:14:35) Cyanocobalamin, Vitamin  B12 injection What is this medicine? CYANOCOBALAMIN (sye an oh koe BAL a min) is a man made form of vitamin B12. Vitamin B12 is used in the growth of healthy blood cells, nerve cells, and proteins in the body. It also helps with the metabolism of fats and carbohydrates. This medicine is used to treat people who can not absorb vitamin B12. This medicine may be used for other purposes; ask your health care provider or pharmacist if you have questions. COMMON BRAND NAME(S): B-12 Compliance Kit, B-12 Injection Kit, Cyomin, LA-12, Nutri-Twelve, Physicians EZ Use B-12, Primabalt What should I tell my health care provider before I take this medicine? They need to know if you have any of these conditions: -kidney disease -Leber's disease -megaloblastic anemia -an unusual or allergic reaction to cyanocobalamin, cobalt, other medicines, foods, dyes, or preservatives -pregnant or trying to get pregnant -breast-feeding How should I use this medicine? This medicine is injected into a muscle or deeply under the skin. It is usually given by a health care professional in a clinic or doctor's office. However, your doctor may teach you how to inject yourself. Follow all instructions. Talk to your pediatrician regarding the use of this medicine in children. Special care may be needed. Overdosage: If you think you have taken too much of this medicine contact a poison control center or emergency room at once. NOTE: This medicine is only for you. Do not share this medicine with others. What if I miss a dose? If you are given your dose at a clinic or doctor's office, call to reschedule your appointment. If you give your own injections and you miss a dose, take it as soon as you can. If it is almost time for your next dose, take only that dose. Do not take double or extra doses. What may interact with this medicine? -colchicine -heavy alcohol intake This list may not describe all possible interactions. Give your  health care provider a list of all the medicines, herbs, non-prescription drugs, or dietary supplements you use. Also tell them if you smoke, drink alcohol, or use illegal drugs. Some items may interact with your medicine. What should I watch for while using this medicine? Visit your doctor or health care professional regularly. You may need blood work done while you are taking this medicine. You may need to follow a special diet. Talk to your doctor. Limit your alcohol intake and avoid smoking to get the best benefit. What side effects may I notice from receiving this medicine? Side effects that you should report to your doctor or health care professional as soon as possible: -allergic reactions like skin rash, itching or hives, swelling of the face, lips, or tongue -blue tint to skin -chest tightness, pain -difficulty breathing, wheezing -dizziness -red, swollen painful area on the leg Side effects that usually do not require medical attention (report to your doctor or health care professional if they continue or are bothersome): -diarrhea -headache This list may not describe all possible side effects. Call your doctor for medical advice about side effects. You may report side effects to FDA at 1-800-FDA-1088. Where should I keep my medicine? Keep out of the reach of children. Store at room temperature between 15 and 30 degrees C (59 and 85 degrees F). Protect from light. Throw away any unused medicine after the expiration date. NOTE: This sheet is  a summary. It may not cover all possible information. If you have questions about this medicine, talk to your doctor, pharmacist, or health care provider.  2018 Elsevier/Gold Standard (2007-12-18 22:10:20)

## 2018-02-08 NOTE — Progress Notes (Addendum)
[facility] day:  02/08/2018   Chief Complaint: Dominique Dyer is a 37 y.o. female currently [redacted] weeks pregnant with iron deficiency anemia and B12 deficiency who is seen for a 2 week assessment and continuation of B12 supplementation and consideration of IV iron.   HPI:  Patient was last seen in the hematology clinic on 01/25/2018.  At that time, she was fatigued.  Exam was stable.  She began weekly B12 injections.  She continued oral iron.  Peripheral smear on 01/25/2018 revealed a microcytic anemia with only mild anisocytosis and poikilocytosis. Neutrophilic leukocytosis was present, with normal morphology. No immature cells were seen. In the past, the patient has had a normal hemoglobin with microcytosis. In addition, the severe microcytosis was not in keeping with the degree of anemia and only mildly elevated RDW. For this reason, a background thalassemia trait is favored, along with iron deficiency. The elevation of neutrophils is probably physiologic, due to pregnancy, but clinical correlation was recommended.  BCR-ABL was negative.  UA was negative.  CBC on 01/24/2018 included a hematocrit of 30.7, hemoglobin 9.8, MCV 67.3, platelets 382,000, WBC 18,200.  Retic was 1.7%.  CBC on 02/01/2018 included a hematocrit of 31.6, hemoglobin 9.8, MCV 70.0, platelets 409,000, WBC 17,100. CBC on 02/07/2018 included a hematocrit of 28.9, hemoglobin 9.1, MCV 67.0, platelets 387,000, WBC 18,300.  Ferritin was 11.  During the interim, she has felt pretty good.  She notes no change in energy.  She is taking oral iron TID with orange juice.  She notes a little nausea this morning.  She believes this is secondary to cutting the grass yesterday.  She has a grass allergy. EDC is 04/11/2018.    Past Medical History:  Diagnosis Date  . Anxiety   . Bipolar disorder ([place])   . Migraines   . Panic attacks     Past Surgical History:  Procedure Laterality Date  .  CESAREAN SECTION    . COLPOSCOPY  2006    Family History  Problem Relation Age of Onset  . Cancer Paternal Uncle   . Cancer Maternal Grandfather     Social History:  reports that she has never smoked. She has never used smokeless tobacco. She reports that she has current or past drug history. Drug: Marijuana. She reports that she does not drink alcohol.  Patient is originally from [place], [place]. She lives in [place].  Patient is unemployed. Patient denies known exposures to radiation on toxins.  The patient is alone today.   Allergies:  Allergies  Allergen Reactions  . Aspirin Nausea Only and Other (See Comments)  . Naproxen Nausea Only, Other (See Comments) and Nausea And Vomiting  . Sumatriptan Succinate Rash    tingling    Current Medications: Current Outpatient Medications  Medication Sig Dispense Refill  . ALPRAZolam (XANAX) 1 MG tablet Take 1.5 mg by mouth at bedtime as needed for anxiety.    Marland Kitchen aspirin EC 81 MG tablet Take 1 tablet (81 mg total) by mouth daily. Take after 12 weeks for prevention of preeclampssia later in pregnancy 300 tablet 2  . cetirizine (ZYRTEC) 10 MG tablet Take 10 mg by mouth daily.    . flintstones complete (FLINTSTONES) 60 MG chewable tablet Chew 1 tablet by mouth daily.    Marland Kitchen omeprazole (PRILOSEC) 10 MG capsule Take 10 mg by mouth daily.    . ondansetron (ZOFRAN ODT) 4 MG disintegrating tablet Take 1 tablet (4 mg total) by  mouth every 6 (six) hours as needed for nausea. 20 tablet 0   No current facility-administered medications for this visit.     Review of Systems  Constitutional: Positive for malaise/fatigue. Negative for diaphoresis, fever and weight loss (weight up 5 pounds).       Feels pretty good.  29 week G2P1; [facility] 04/11/2018  HENT: Negative.  Negative for congestion, nosebleeds, sinus pain and sore throat.   Eyes: Negative.  Negative for blurred vision, double vision, pain and discharge.  Respiratory: Positive for shortness of breath  (exertional). Negative for cough, hemoptysis and sputum production.   Cardiovascular: Negative for chest pain, palpitations, orthopnea, leg swelling and PND.  Gastrointestinal: Positive for constipation and nausea (intermittent). Negative for abdominal pain, blood in stool, diarrhea, melena and vomiting.  Genitourinary: Negative for dysuria, frequency, hematuria and urgency.  Musculoskeletal: Negative for back pain, falls, joint pain and myalgias.  Skin: Negative for itching and rash.  Neurological: Negative for dizziness, tremors, weakness and headaches.  Endo/Heme/Allergies: Does not bruise/bleed easily.       Grass allergy  Psychiatric/Behavioral: Negative for depression, memory loss and suicidal ideas. The patient is not nervous/anxious and does not have insomnia.   All other systems reviewed and are negative.   Physical Exam: Blood pressure 117/66, pulse (!) 106, temperature (!) 97.4 F (36.3 C), temperature source Tympanic, resp. rate 18, weight 244 lb 11.4 oz (111 kg), last menstrual period 07/12/2017, SpO2 99 %. GENERAL:  Well developed, well nourished, woman sitting comfortably in the exam room in no acute distress. MENTAL STATUS:  Alert and oriented to person, place and time. HEAD:  Long dark hair.  Normocephalic, atraumatic, face symmetric, no Cushingoid features. EYES:  Brown eyes.  Pupils equal round and reactive to light and accomodation.  No conjunctivitis or scleral icterus. ENT:  Oropharynx clear without lesion.  Tongue normal. Mucous membranes moist.  RESPIRATORY:  Clear to auscultation without rales, wheezes or rhonchi. CARDIOVASCULAR:  Regular rate and rhythm without murmur, rub or gallop. ABDOMEN:  Pregnant.  Soft, non-tender, with active bowel sounds, and no hepatosplenomegaly.  No masses. SKIN:  No rashes, ulcers or lesions. EXTREMITIES: No edema, no skin discoloration or tenderness.  No palpable cords. LYMPH NODES: No palpable cervical, supraclavicular, axillary or  inguinal adenopathy  NEUROLOGICAL: Unremarkable. PSYCH:  Appropriate.    Appointment on 02/07/2018  Component Date Value Ref Range Status  . Ferritin 02/07/2018 11  11 - 307 ng/mL Final   Performed at [facility], [address] [place], [place] [address]  . WBC 02/07/2018 18.3* 3.6 - 11.0 K/uL Final  . RBC 02/07/2018 4.31  3.80 - 5.20 MIL/uL Final  . Hemoglobin 02/07/2018 9.1* 12.0 - 16.0 g/dL Final  . HCT 02/07/2018 28.9* 35.0 - 47.0 % Final  . MCV 02/07/2018 67.0* 80.0 - 100.0 fL Final  . MCH 02/07/2018 21.2* 26.0 - 34.0 pg Final  . MCHC 02/07/2018 31.7* 32.0 - 36.0 g/dL Final  . RDW 02/07/2018 15.5* 11.5 - 14.5 % Final  . Platelets 02/07/2018 387  150 - 440 K/uL Final  . Neutrophils Relative % 02/07/2018 78  % Final  . Neutro Abs 02/07/2018 14.5* 1.4 - 6.5 K/uL Final  . Lymphocytes Relative 02/07/2018 14  % Final  . Lymphs Abs 02/07/2018 2.5  1.0 - 3.6 K/uL Final  . Monocytes Relative 02/07/2018 6  % Final  . Monocytes Absolute 02/07/2018 1.1* 0.2 - 0.9 K/uL Final  . Eosinophils Relative 02/07/2018 1  % Final  . Eosinophils  Absolute 02/07/2018 0.1  0 - 0.7 K/uL Final  . Basophils Relative 02/07/2018 1  % Final  . Basophils Absolute 02/07/2018 0.1  0 - 0.1 K/uL Final   Performed at [facility], [address] [place], [place] [address]    Assessment:  Dominique Dyer is a 37 y.o. female currently [redacted] weeks pregnant with iron deficiency anemia.  Diet appears good.  She has ice pica.  She denies any bleeding.  She is on oral iron.  CBC on 10/12/2017 revealed a hematocrit of 32.4, hemoglobin 9.9, and MCV 67.  Ferritin was 13 with an iron saturation of 15% and a TIBC of 440 on 11/09/2017.  Labs on 12/21/2017 revealed a hematocrit of 32.0, hemoglobin 10.2, and MCV 68.1.  Ferritin was 6.  Iron saturation 3% with a TIBC of 517.  Reticulocytes 1%.  B12 level was low at 195.  Folate normal at 13.9.  She is 29 week G2P1, with an EDC of 04/11/2018.  Patient is  scheduled to have a planned cesarean section.   Ferritin has been followed:  13 on 11/09/2017, 6 on 12/21/2017, 12 on 01/24/2018, and 11 on 02/07/2018.  She had been on oral iron since 12/22/2017.  She has B12 deficiency.  B12 was 195 on 12/21/2017 and 177 on 01/24/2018.  She had been on oral B12.  She began B12 injections on 01/25/2018.  Symptomatically, she feels pretty good.  She has noted no change in energy level.  Exam is stable.  Plan: 1.  Review labs from yesterday.  Hemoglobin and MCV decreasing despite oral iron.  Discuss IV iron.  Potential side effects reviewed including possible allergic reaction.  Patient in agreement to beginning IV iron today. 2.  Discuss review of peripheral smear and BCR-ABL testing.  Regarding her leukocytosis, BCR-ABL was negative.  Smear without abnormal WBCs.  Suspect elevated WBC physiologic.  Patient has microcytic RBC indices and iron deficiency.  Consider thalassemia screen after iron stores replete for evaluation of possible trait. 3.  Continue weekly B12. 4.  Venofer today and weekly x 3. 5.  RTC in 2 weeks for MD assessment and labs (CBC with diff, hemoglobin electrophoresis, folate), B12 and Venofer.   Lequita Asal, MD  02/08/2018, 4:35 PM

## 2018-02-08 NOTE — Progress Notes (Signed)
Patient continues to be SOB with minimal exertion.  States she is nauseated this morning.

## 2018-02-15 ENCOUNTER — Ambulatory Visit: Payer: Medicaid Other

## 2018-02-15 ENCOUNTER — Ambulatory Visit (INDEPENDENT_AMBULATORY_CARE_PROVIDER_SITE_OTHER): Payer: Medicaid Other | Admitting: Obstetrics and Gynecology

## 2018-02-15 ENCOUNTER — Encounter: Payer: Self-pay | Admitting: Obstetrics and Gynecology

## 2018-02-15 VITALS — BP 119/75 | HR 120 | Wt 245.2 lb

## 2018-02-15 DIAGNOSIS — O99013 Anemia complicating pregnancy, third trimester: Secondary | ICD-10-CM

## 2018-02-15 DIAGNOSIS — Z3483 Encounter for supervision of other normal pregnancy, third trimester: Secondary | ICD-10-CM

## 2018-02-15 LAB — POCT URINALYSIS DIPSTICK
BILIRUBIN UA: NEGATIVE
Glucose, UA: NEGATIVE
KETONES UA: NEGATIVE
Leukocytes, UA: NEGATIVE
Nitrite, UA: NEGATIVE
PH UA: 7.5 (ref 5.0–8.0)
Protein, UA: NEGATIVE
RBC UA: NEGATIVE
SPEC GRAV UA: 1.01 (ref 1.010–1.025)
UROBILINOGEN UA: 0.2 U/dL

## 2018-02-15 NOTE — Progress Notes (Signed)
ROB-pt stated that she doing well no concerns.

## 2018-02-15 NOTE — Progress Notes (Signed)
ROB: No complaints.  Patient continues to see hematology for iron infusions and work-up for anemia and elevated WBCs.  She has an appointment follow-up this Friday.

## 2018-02-16 ENCOUNTER — Other Ambulatory Visit: Payer: Self-pay | Admitting: Certified Nurse Midwife

## 2018-02-17 ENCOUNTER — Other Ambulatory Visit: Payer: Self-pay | Admitting: Certified Nurse Midwife

## 2018-02-17 ENCOUNTER — Inpatient Hospital Stay: Payer: Medicaid Other

## 2018-02-17 DIAGNOSIS — D509 Iron deficiency anemia, unspecified: Secondary | ICD-10-CM | POA: Diagnosis not present

## 2018-02-17 MED ORDER — CYANOCOBALAMIN 1000 MCG/ML IJ SOLN
1000.0000 ug | Freq: Once | INTRAMUSCULAR | Status: AC
Start: 2018-02-17 — End: 2018-02-17
  Administered 2018-02-17: 1000 ug via INTRAMUSCULAR
  Filled 2018-02-17: qty 1

## 2018-02-17 MED ORDER — IRON SUCROSE 20 MG/ML IV SOLN
INTRAVENOUS | Status: AC
  Filled 2018-02-17: qty 10

## 2018-02-17 MED ORDER — SODIUM CHLORIDE 0.9 % IV SOLN
Freq: Once | INTRAVENOUS | Status: AC
  Administered 2018-02-17: 09:00:00 via INTRAVENOUS
  Filled 2018-02-17: qty 1000

## 2018-02-17 MED ORDER — SODIUM CHLORIDE 0.9% FLUSH
10.0000 mL | INTRAVENOUS | Status: DC | PRN
  Filled 2018-02-17: qty 10

## 2018-02-17 MED ORDER — SODIUM CHLORIDE 0.9% FLUSH
3.0000 mL | Freq: Once | INTRAVENOUS | Status: DC | PRN
  Filled 2018-02-17: qty 3

## 2018-02-17 MED ORDER — IRON SUCROSE 20 MG/ML IV SOLN
200.0000 mg | Freq: Once | INTRAVENOUS | Status: AC
  Administered 2018-02-17: 200 mg via INTRAVENOUS
  Filled 2018-02-17: qty 10

## 2018-02-17 MED ORDER — ONDANSETRON 4 MG PO TBDP: 4.0000 mg | ORAL_TABLET | Freq: Four times a day (QID) | ORAL | 0 refills | Status: DC | PRN

## 2018-02-22 ENCOUNTER — Ambulatory Visit: Payer: Medicaid Other

## 2018-02-22 ENCOUNTER — Inpatient Hospital Stay: Payer: Medicaid Other | Attending: Hematology and Oncology

## 2018-02-22 ENCOUNTER — Other Ambulatory Visit: Payer: Self-pay

## 2018-02-22 ENCOUNTER — Inpatient Hospital Stay (HOSPITAL_BASED_OUTPATIENT_CLINIC_OR_DEPARTMENT_OTHER): Payer: Medicaid Other | Admitting: Hematology and Oncology

## 2018-02-22 ENCOUNTER — Inpatient Hospital Stay: Payer: Medicaid Other

## 2018-02-22 VITALS — BP 117/78 | HR 85 | Temp 96.2°F | Resp 18

## 2018-02-22 VITALS — BP 120/81 | HR 89 | Temp 95.9°F | Resp 18 | Wt 249.8 lb

## 2018-02-22 DIAGNOSIS — R000 Tachycardia, unspecified: Secondary | ICD-10-CM | POA: Diagnosis not present

## 2018-02-22 DIAGNOSIS — D509 Iron deficiency anemia, unspecified: Secondary | ICD-10-CM

## 2018-02-22 DIAGNOSIS — R112 Nausea with vomiting, unspecified: Secondary | ICD-10-CM | POA: Insufficient documentation

## 2018-02-22 DIAGNOSIS — E538 Deficiency of other specified B group vitamins: Secondary | ICD-10-CM

## 2018-02-22 DIAGNOSIS — Z331 Pregnant state, incidental: Secondary | ICD-10-CM | POA: Insufficient documentation

## 2018-02-22 DIAGNOSIS — Z7982 Long term (current) use of aspirin: Secondary | ICD-10-CM | POA: Diagnosis not present

## 2018-02-22 DIAGNOSIS — F319 Bipolar disorder, unspecified: Secondary | ICD-10-CM | POA: Insufficient documentation

## 2018-02-22 DIAGNOSIS — Z800 Family history of malignant neoplasm of digestive organs: Secondary | ICD-10-CM | POA: Diagnosis not present

## 2018-02-22 DIAGNOSIS — Z79899 Other long term (current) drug therapy: Secondary | ICD-10-CM

## 2018-02-22 DIAGNOSIS — R5383 Other fatigue: Secondary | ICD-10-CM

## 2018-02-22 DIAGNOSIS — O99013 Anemia complicating pregnancy, third trimester: Secondary | ICD-10-CM

## 2018-02-22 LAB — CBC WITH DIFFERENTIAL/PLATELET
Basophils Absolute: 0 10*3/uL (ref 0–0.1)
Basophils Relative: 0 %
Eosinophils Absolute: 0 10*3/uL (ref 0–0.7)
Eosinophils Relative: 0 %
HCT: 29.1 % — ABNORMAL LOW (ref 35.0–47.0)
Hemoglobin: 9.2 g/dL — ABNORMAL LOW (ref 12.0–16.0)
Lymphocytes Relative: 14 %
Lymphs Abs: 2.1 10*3/uL (ref 1.0–3.6)
MCH: 21.7 pg — ABNORMAL LOW (ref 26.0–34.0)
MCHC: 31.5 g/dL — ABNORMAL LOW (ref 32.0–36.0)
MCV: 69 fL — ABNORMAL LOW (ref 80.0–100.0)
Monocytes Absolute: 0.7 10*3/uL (ref 0.2–0.9)
Monocytes Relative: 5 %
Neutro Abs: 11.7 10*3/uL — ABNORMAL HIGH (ref 1.4–6.5)
Neutrophils Relative %: 81 %
Platelets: 328 10*3/uL (ref 150–440)
RBC: 4.22 MIL/uL (ref 3.80–5.20)
RDW: 17.1 % — ABNORMAL HIGH (ref 11.5–14.5)
WBC: 14.6 10*3/uL — ABNORMAL HIGH (ref 3.6–11.0)

## 2018-02-22 LAB — FOLATE: Folate: 6.3 ng/mL (ref >5.9)

## 2018-02-22 MED ORDER — IRON SUCROSE 20 MG/ML IV SOLN
200.0000 mg | Freq: Once | INTRAVENOUS | Status: AC
  Administered 2018-02-22: 200 mg via INTRAVENOUS
  Filled 2018-02-22: qty 10

## 2018-02-22 MED ORDER — IRON SUCROSE 20 MG/ML IV SOLN
INTRAVENOUS | Status: AC
  Filled 2018-02-22: qty 10

## 2018-02-22 MED ORDER — ONDANSETRON 4 MG PO TBDP: 4.0000 mg | ORAL_TABLET | Freq: Four times a day (QID) | ORAL | 1 refills | Status: DC | PRN

## 2018-02-22 MED ORDER — CYANOCOBALAMIN 1000 MCG/ML IJ SOLN
1000.0000 ug | Freq: Once | INTRAMUSCULAR | Status: AC
  Administered 2018-02-22: 1000 ug via INTRAMUSCULAR

## 2018-02-22 MED ORDER — CYANOCOBALAMIN 1000 MCG/ML IJ SOLN
INTRAMUSCULAR | Status: AC
  Filled 2018-02-22: qty 1

## 2018-02-22 MED ORDER — SODIUM CHLORIDE 0.9 % IV SOLN
Freq: Once | INTRAVENOUS | Status: AC
  Administered 2018-02-22: 10:00:00 via INTRAVENOUS
  Filled 2018-02-22: qty 1000

## 2018-02-22 NOTE — Progress Notes (Addendum)
[facility] day: 02/22/2018    Chief Complaint: Dominique Dyer is a 37 y.o. female currently [redacted] weeks pregnant with iron deficiency anemia and B12 deficiency who is seen for a 2 week assessment and continuation of B12 supplementation and IV iron.   HPI:  Patient was last seen in the hematology clinic on 02/08/2018.  At that time, she felt pretty good.  She had noted no change in energy level.  Hematocrit was 28.9, hemoglobin 9.1, and MCV 67.  Ferritin was 11.  She received week #3 B12.    She received Venofer weekly x 2 (02/08/2018 - 02/17/2018).  She received B12 on 02/17/2018.  During the interim, she still feels exhausted.  She notes shortness of breath with little exercise.  She describes nausea and vomiting this AM.  She denies any constipation.  She denies any bleeding.  She is not taking oral iron or B12.   Past Medical History:  Diagnosis Date  . Anxiety   . Bipolar disorder ([place])   . Migraines   . Panic attacks     Past Surgical History:  Procedure Laterality Date  . CESAREAN SECTION    . COLPOSCOPY  2006    Family History  Problem Relation Age of Onset  . Cancer Paternal Uncle   . Cancer Maternal Grandfather     Social History:  reports that she has never smoked. She has never used smokeless tobacco. She reports that she has current or past drug history. Drug: Marijuana. She reports that she does not drink alcohol.  Patient is originally from [place], [place]. She lives in [place].  Patient is unemployed. Patient denies known exposures to radiation on toxins.  The patient is alone today.   Allergies:  Allergies  Allergen Reactions  . Aspirin Nausea Only and Other (See Comments)  . Naproxen Nausea Only, Other (See Comments) and Nausea And Vomiting  . Sumatriptan Succinate Rash    tingling    Current Medications: Current Outpatient Medications  Medication Sig Dispense Refill  . ALPRAZolam (XANAX) 1 MG tablet Take  1.5 mg by mouth at bedtime as needed for anxiety.    Marland Kitchen aspirin EC 81 MG tablet Take 1 tablet (81 mg total) by mouth daily. Take after 12 weeks for prevention of preeclampssia later in pregnancy 300 tablet 2  . cetirizine (ZYRTEC) 10 MG tablet Take 10 mg by mouth daily.    . flintstones complete (FLINTSTONES) 60 MG chewable tablet Chew 1 tablet by mouth daily.    Marland Kitchen omeprazole (PRILOSEC) 10 MG capsule Take 10 mg by mouth daily.    . nitrofurantoin, macrocrystal-monohydrate, (MACROBID) 100 MG capsule Take 1 capsule (100 mg total) by mouth 2 (two) times daily. 14 capsule 1  . ondansetron (ZOFRAN ODT) 4 MG disintegrating tablet Take 1 tablet (4 mg total) by mouth every 6 (six) hours as needed for nausea. 20 tablet 1   No current facility-administered medications for this visit.     Review of Systems  Constitutional: Positive for malaise/fatigue. Negative for diaphoresis, fever and weight loss (weight up 4 pounds).       Feels tired.  31 week G2P1; [facility] 04/11/2018  HENT: Negative.  Negative for congestion, ear discharge, ear pain, hearing loss, nosebleeds, sinus pain and sore throat.   Eyes: Negative.  Negative for blurred vision, double vision, pain and discharge.  Respiratory: Positive for shortness of breath (with exercise). Negative for cough, hemoptysis, sputum production and wheezing.  Cardiovascular: Negative.  Negative for chest pain, palpitations, orthopnea, leg swelling and PND.  Gastrointestinal: Positive for nausea (intermittent) and vomiting (at 3 AM today). Negative for abdominal pain, blood in stool, constipation, diarrhea and melena.  Genitourinary: Negative.  Negative for dysuria, frequency, hematuria and urgency.  Musculoskeletal: Negative.  Negative for back pain, falls, joint pain and myalgias.  Skin: Negative.  Negative for itching and rash.  Neurological: Negative.  Negative for dizziness, tremors, weakness and headaches.  Endo/Heme/Allergies: Does not bruise/bleed easily.        Grass allergy  Psychiatric/Behavioral: Negative for depression, memory loss and suicidal ideas. The patient is not nervous/anxious and does not have insomnia.   All other systems reviewed and are negative.   Physical Exam: Blood pressure 120/81, pulse 89, temperature (!) 95.9 F (35.5 C), temperature source Tympanic, resp. rate 18, weight 249 lb 12.5 oz (113.3 kg), last menstrual period 07/12/2017, SpO2 100 %. GENERAL:  Well developed, well nourished, woman sitting comfortably in the exam room in no acute distress. MENTAL STATUS:  Alert and oriented to person, place and time. HEAD:  Long dark hair.  Normocephalic, atraumatic, face symmetric, no Cushingoid features. EYES:  Brown eyes.  Pupils equal round and reactive to light and accomodation.  No conjunctivitis or scleral icterus. ENT:  Oropharynx clear without lesion.  Tongue normal. Mucous membranes moist.  RESPIRATORY:  Clear to auscultation without rales, wheezes or rhonchi. CARDIOVASCULAR:  Regular rate and rhythm without murmur, rub or gallop. ABDOMEN:  Pregnant.  Soft, non-tender, with active bowel sounds, and no hepatosplenomegaly.  No masses. SKIN:  No rashes, ulcers or lesions. EXTREMITIES: No edema, no skin discoloration or tenderness.  No palpable cords. LYMPH NODES: No palpable cervical, supraclavicular, axillary or inguinal adenopathy  NEUROLOGICAL: Unremarkable. PSYCH:  Appropriate.    Appointment on 02/22/2018  Component Date Value Ref Range Status  . Folate 02/22/2018 6.3  >5.9 ng/mL Final   Performed at [facility], [address] [place], [place] [address]  . Hgb A2 Quant 02/22/2018 3.5* 1.8 - 3.2 % Final  . Hgb F Quant 02/22/2018 0.0  0.0 - 2.0 % Final  . Hgb S Quant 02/22/2018 0.0  0.0 % Final  . Hgb C 02/22/2018 0.0  0.0 % Corrected  . Hgb A 02/22/2018 96.5  96.4 - 98.8 % Final  . Hgb Variant 02/22/2018 0.0  0.0 % Corrected  . 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 Suggest clinical and hematologic correlation. Performed At: [facility] [place], [place] [id number] Rush Farmer MD UK:[id number] Performed at [facility], [address], [place], [place] [address]   . WBC 02/22/2018 14.6* 3.6 - 11.0 K/uL Final  . RBC 02/22/2018 4.22  3.80 - 5.20 MIL/uL Final  . Hemoglobin 02/22/2018 9.2* 12.0 - 16.0 g/dL Final  . HCT 02/22/2018 29.1* 35.0 - 47.0 % Final  . MCV 02/22/2018 69.0* 80.0 - 100.0 fL Final  . MCH 02/22/2018 21.7* 26.0 - 34.0 pg Final  . MCHC 02/22/2018 31.5* 32.0 - 36.0 g/dL Final  . RDW 02/22/2018 17.1* 11.5 - 14.5 % Final  . Platelets 02/22/2018 328  150 - 440 K/uL Final  . Neutrophils Relative % 02/22/2018 81  % Final  . Neutro Abs 02/22/2018 11.7* 1.4 - 6.5 K/uL Final  . Lymphocytes Relative 02/22/2018 14  % Final  . Lymphs Abs 02/22/2018 2.1  1.0 -  3.6 K/uL Final  . Monocytes Relative 02/22/2018 5  % Final  . Monocytes Absolute 02/22/2018 0.7  0.2 - 0.9 K/uL Final  . Eosinophils Relative 02/22/2018 0  % Final  . Eosinophils Absolute 02/22/2018 0.0  0 - 0.7 K/uL Final  . Basophils Relative 02/22/2018 0  % Final  . Basophils Absolute 02/22/2018 0.0  0 - 0.1 K/uL Final   Performed at [facility], [address] [place], [place] [address]    Assessment:  Dominique Dyer is a 37 y.o. female currently [redacted] weeks pregnant with iron deficiency anemia.  Diet appears good.  She has ice pica.  She denies any bleeding.  She was on oral iron.  CBC on 10/12/2017 revealed a hematocrit of 32.4, hemoglobin 9.9, and MCV 67.  Ferritin was 13 with an iron saturation of 15% and a TIBC of 440 on 11/09/2017.  Labs on 12/21/2017 revealed a hematocrit of 32.0, hemoglobin 10.2, and MCV 68.1.  Ferritin was 6.  Iron saturation 3% with a TIBC of 517.  Reticulocytes 1%.  B12 level was low at 195.  Folate normal at 13.9.  BCR-ABL was negative on  01/25/2018.  She is 31 week G2P1, with an EDC of 04/11/2018.  Patient is scheduled to have a planned cesarean section.   She received Venofer weekly x 2 (02/08/2018 - 02/17/2018).  Ferritin has been followed:  13 on 11/09/2017, 6 on 12/21/2017, 12 on 01/24/2018, and 11 on 02/07/2018.  She was on oral iron.  She has B12 deficiency.  B12 was 195 on 12/21/2017 and 177 on 01/24/2018.  She was on oral B12.  She began B12 injections on 01/25/2018 (last 02/17/2018).  Symptomatically, she remains fatigued.  Exam is stable.  Hemoglobin is 9.2.  Plan: 1.  Labs today:  CBC with diff, hemoglobin electrophoresis, folate. 2.  Continue weekly B12 x 6 then monthly (next due 02/24/2018). 3.  Continue weekly Venofer x 2. 4.  RTC in 2 weeks for MD assessment, labs (CBC with diff, ferritin-day before) and +/- Venofer.   Lequita Asal, MD  02/22/2018, 3:35 PM

## 2018-02-22 NOTE — Progress Notes (Signed)
Patient states she continues to be fatigued even after her transfusions.

## 2018-02-24 LAB — HEMOGLOBINOPATHY EVALUATION
Hgb A2 Quant: 3.5 % — ABNORMAL HIGH (ref 1.8–3.2)
Hgb A: 96.5 % (ref 96.4–98.8)
Hgb C: 0 %
Hgb F Quant: 0 % (ref 0.0–2.0)
Hgb S Quant: 0 %
Hgb Variant: 0 %

## 2018-03-01 ENCOUNTER — Ambulatory Visit (INDEPENDENT_AMBULATORY_CARE_PROVIDER_SITE_OTHER): Payer: Medicaid Other | Admitting: Obstetrics and Gynecology

## 2018-03-01 ENCOUNTER — Inpatient Hospital Stay: Payer: Medicaid Other

## 2018-03-01 ENCOUNTER — Ambulatory Visit: Payer: Medicaid Other

## 2018-03-01 VITALS — BP 122/69 | HR 118 | Wt 248.8 lb

## 2018-03-01 VITALS — BP 108/72 | HR 82 | Temp 97.1°F | Resp 18

## 2018-03-01 DIAGNOSIS — O99013 Anemia complicating pregnancy, third trimester: Secondary | ICD-10-CM

## 2018-03-01 DIAGNOSIS — O34219 Maternal care for unspecified type scar from previous cesarean delivery: Secondary | ICD-10-CM

## 2018-03-01 DIAGNOSIS — O09523 Supervision of elderly multigravida, third trimester: Secondary | ICD-10-CM

## 2018-03-01 DIAGNOSIS — D509 Iron deficiency anemia, unspecified: Secondary | ICD-10-CM | POA: Diagnosis not present

## 2018-03-01 DIAGNOSIS — O99210 Obesity complicating pregnancy, unspecified trimester: Secondary | ICD-10-CM

## 2018-03-01 DIAGNOSIS — E538 Deficiency of other specified B group vitamins: Secondary | ICD-10-CM

## 2018-03-01 LAB — POCT URINALYSIS DIPSTICK
BILIRUBIN UA: NEGATIVE
GLUCOSE UA: NEGATIVE
Ketones, UA: NEGATIVE
LEUKOCYTES UA: NEGATIVE
Nitrite, UA: NEGATIVE
Protein, UA: POSITIVE — AB
RBC UA: NEGATIVE
SPEC GRAV UA: 1.02 (ref 1.010–1.025)
Urobilinogen, UA: 0.2 E.U./dL
pH, UA: 6.5 (ref 5.0–8.0)

## 2018-03-01 MED ORDER — CYANOCOBALAMIN 1000 MCG/ML IJ SOLN
1000.0000 ug | Freq: Once | INTRAMUSCULAR | Status: AC
  Administered 2018-03-01: 1000 ug via INTRAMUSCULAR

## 2018-03-01 MED ORDER — IRON SUCROSE 20 MG/ML IV SOLN
INTRAVENOUS | Status: AC
  Filled 2018-03-01: qty 10

## 2018-03-01 MED ORDER — CYANOCOBALAMIN 1000 MCG/ML IJ SOLN
INTRAMUSCULAR | Status: AC
  Filled 2018-03-01: qty 1

## 2018-03-01 MED ORDER — IRON SUCROSE 20 MG/ML IV SOLN
200.0000 mg | Freq: Once | INTRAVENOUS | Status: AC
  Administered 2018-03-01: 200 mg via INTRAVENOUS
  Filled 2018-03-01: qty 10

## 2018-03-01 MED ORDER — SODIUM CHLORIDE 0.9 % IV SOLN
Freq: Once | INTRAVENOUS | Status: AC
  Administered 2018-03-01: 09:00:00 via INTRAVENOUS
  Filled 2018-03-01: qty 1000

## 2018-03-01 NOTE — Progress Notes (Signed)
ROB: Patient doing well, no major complaints, but does note fatigue. To continue iron and B12 injections.  Discussed scheduling patient for repeat C-section, can schedule for 04/14/2018 (Friday). Has appt with Cardiology on 6/26. RTC in 2 weeks.

## 2018-03-01 NOTE — Progress Notes (Signed)
ROB-pt stated that she has been having lower back pain and pelvic pain. Pt stated that she has been having contractions as well but they are not regular.

## 2018-03-01 NOTE — Patient Instructions (Signed)
Iron Sucrose injection What is this medicine? IRON SUCROSE (AHY ern SOO krohs) is an iron complex. Iron is used to make healthy red blood cells, which carry oxygen and nutrients throughout the body. This medicine is used to treat iron deficiency anemia in people with chronic kidney disease. This medicine may be used for other purposes; ask your health care provider or pharmacist if you have questions. COMMON BRAND NAME(S): Venofer What should I tell my health care provider before I take this medicine? They need to know if you have any of these conditions: -anemia not caused by low iron levels -heart disease -high levels of iron in the blood -kidney disease -liver disease -an unusual or allergic reaction to iron, other medicines, foods, dyes, or preservatives -pregnant or trying to get pregnant -breast-feeding How should I use this medicine? This medicine is for infusion into a vein. It is given by a health care professional in a hospital or clinic setting. Talk to your pediatrician regarding the use of this medicine in children. While this drug may be prescribed for children as young as 2 years for selected conditions, precautions do apply. Overdosage: If you think you have taken too much of this medicine contact a poison control center or emergency room at once. NOTE: This medicine is only for you. Do not share this medicine with others. What if I miss a dose? It is important not to miss your dose. Call your doctor or health care professional if you are unable to keep an appointment. What may interact with this medicine? Do not take this medicine with any of the following medications: -deferoxamine -dimercaprol -other iron products This medicine may also interact with the following medications: -chloramphenicol -deferasirox This list may not describe all possible interactions. Give your health care provider a list of all the medicines, herbs, non-prescription drugs, or dietary  supplements you use. Also tell them if you smoke, drink alcohol, or use illegal drugs. Some items may interact with your medicine. What should I watch for while using this medicine? Visit your doctor or healthcare professional regularly. Tell your doctor or healthcare professional if your symptoms do not start to get better or if they get worse. You may need blood work done while you are taking this medicine. You may need to follow a special diet. Talk to your doctor. Foods that contain iron include: whole grains/cereals, dried fruits, beans, or peas, leafy green vegetables, and organ meats (liver, kidney). What side effects may I notice from receiving this medicine? Side effects that you should report to your doctor or health care professional as soon as possible: -allergic reactions like skin rash, itching or hives, swelling of the face, lips, or tongue -breathing problems -changes in blood pressure -cough -fast, irregular heartbeat -feeling faint or lightheaded, falls -fever or chills -flushing, sweating, or hot feelings -joint or muscle aches/pains -seizures -swelling of the ankles or feet -unusually weak or tired Side effects that usually do not require medical attention (report to your doctor or health care professional if they continue or are bothersome): -diarrhea -feeling achy -headache -irritation at site where injected -nausea, vomiting -stomach upset -tiredness This list may not describe all possible side effects. Call your doctor for medical advice about side effects. You may report side effects to FDA at 1-800-FDA-1088. Where should I keep my medicine? This drug is given in a hospital or clinic and will not be stored at home. NOTE: This sheet is a summary. It may not cover all possible information. If   you have questions about this medicine, talk to your doctor, pharmacist, or health care provider.  2018 Elsevier/Gold Standard (2011-06-17 17:14:35)  

## 2018-03-06 ENCOUNTER — Inpatient Hospital Stay: Payer: Medicaid Other

## 2018-03-06 DIAGNOSIS — D509 Iron deficiency anemia, unspecified: Secondary | ICD-10-CM | POA: Diagnosis not present

## 2018-03-06 LAB — CBC WITH DIFFERENTIAL/PLATELET
Basophils Absolute: 0 10*3/uL (ref 0–0.1)
Basophils Relative: 0 %
Eosinophils Absolute: 0 10*3/uL (ref 0–0.7)
Eosinophils Relative: 0 %
HCT: 33.8 % — ABNORMAL LOW (ref 35.0–47.0)
Hemoglobin: 10.8 g/dL — ABNORMAL LOW (ref 12.0–16.0)
Lymphocytes Relative: 15 %
Lymphs Abs: 1.9 10*3/uL (ref 1.0–3.6)
MCH: 22.1 pg — ABNORMAL LOW (ref 26.0–34.0)
MCHC: 31.9 g/dL — ABNORMAL LOW (ref 32.0–36.0)
MCV: 69.3 fL — ABNORMAL LOW (ref 80.0–100.0)
Monocytes Absolute: 0.6 10*3/uL (ref 0.2–0.9)
Monocytes Relative: 4 %
Neutro Abs: 10.8 10*3/uL — ABNORMAL HIGH (ref 1.4–6.5)
Neutrophils Relative %: 81 %
Platelets: 354 10*3/uL (ref 150–440)
RBC: 4.89 MIL/uL (ref 3.80–5.20)
RDW: 19.2 % — ABNORMAL HIGH (ref 11.5–14.5)
WBC: 13.4 10*3/uL — ABNORMAL HIGH (ref 3.6–11.0)

## 2018-03-06 LAB — FERRITIN: Ferritin: 197 ng/mL (ref 11–307)

## 2018-03-07 ENCOUNTER — Encounter: Payer: Self-pay | Admitting: Obstetrics and Gynecology

## 2018-03-07 ENCOUNTER — Ambulatory Visit (INDEPENDENT_AMBULATORY_CARE_PROVIDER_SITE_OTHER): Payer: Medicaid Other | Admitting: Obstetrics and Gynecology

## 2018-03-07 VITALS — BP 134/82 | HR 144 | Temp 98.2°F | Ht 64.5 in | Wt 246.9 lb

## 2018-03-07 DIAGNOSIS — R300 Dysuria: Secondary | ICD-10-CM | POA: Diagnosis not present

## 2018-03-07 DIAGNOSIS — N3001 Acute cystitis with hematuria: Secondary | ICD-10-CM

## 2018-03-07 LAB — POCT URINALYSIS DIPSTICK
Clarity, UA: NEGATIVE
Glucose, UA: NEGATIVE
Ketones, UA: NEGATIVE
LEUKOCYTES UA: NEGATIVE
Nitrite, UA: NEGATIVE
Odor: NEGATIVE
PH UA: 6 (ref 5.0–8.0)
Protein, UA: POSITIVE — AB
SPEC GRAV UA: 1.02 (ref 1.010–1.025)
UROBILINOGEN UA: 0.2 U/dL

## 2018-03-07 MED ORDER — NITROFURANTOIN MONOHYD MACRO 100 MG PO CAPS: 100.0000 mg | ORAL_CAPSULE | Freq: Two times a day (BID) | ORAL | 1 refills | Status: DC

## 2018-03-07 NOTE — Progress Notes (Signed)
HPI:      [name] is a 37 y.o. G2P1 who LMP was Patient's last menstrual period was 07/12/2017.     Subjective:   She presents today with complaint of urinary frequency, urgency, and pain with urination.  She reports that she occasionally gets bladder infections and that is what it seems like to her. Reports active fetal movement. Denies leakage of fluid or vaginal bleeding. Has an appointment scheduled with cardiology next week and a further discussion with the genetic counselor regarding the possibility of thalassemia trait.    Hx: The following portions of the patient's history were reviewed and updated as appropriate:             She  has a past medical history of Anxiety, Bipolar disorder ([place]), Migraines, and Panic attacks. She does not have any pertinent problems on file. She  has a past surgical history that includes Cesarean section and Colposcopy (2006). Her family history includes Cancer in her maternal grandfather and paternal uncle. She  reports that she has never smoked. She has never used smokeless tobacco. She reports that she has current or past drug history. Drug: Marijuana. She reports that she does not drink alcohol. She has a current medication list which includes the following prescription(s): alprazolam, aspirin ec, cetirizine, flintstones complete, omeprazole, ondansetron, and nitrofurantoin (macrocrystal-monohydrate). She is allergic to aspirin; naproxen; and sumatriptan succinate.       Review of Systems:  Review of Systems  Constitutional: Denied constitutional symptoms, night sweats, recent illness, fatigue, fever, insomnia and weight loss.  Eyes: Denied eye symptoms, eye pain, photophobia, vision change and visual disturbance.  Ears/Nose/Throat/Neck: Denied ear, nose, throat or neck symptoms, hearing loss, nasal discharge, sinus congestion and sore throat.  Cardiovascular: Denied cardiovascular symptoms, arrhythmia, chest pain/pressure, edema,  exercise intolerance, orthopnea and palpitations.  Respiratory: Denied pulmonary symptoms, asthma, pleuritic pain, productive sputum, cough, dyspnea and wheezing.  Gastrointestinal: Denied, gastro-esophageal reflux, melena, nausea and vomiting.  Genitourinary: See HPI for additional information.  Musculoskeletal: Denied musculoskeletal symptoms, stiffness, swelling, muscle weakness and myalgia.  Dermatologic: Denied dermatology symptoms, rash and scar.  Neurologic: Denied neurology symptoms, dizziness, headache, neck pain and syncope.  Psychiatric: Denied psychiatric symptoms, anxiety and depression.  Endocrine: Denied endocrine symptoms including hot flashes and night sweats.   Meds:   Current Outpatient Medications on File Prior to Visit  Medication Sig Dispense Refill  . ALPRAZolam (XANAX) 1 MG tablet Take 1.5 mg by mouth at bedtime as needed for anxiety.    Marland Kitchen aspirin EC 81 MG tablet Take 1 tablet (81 mg total) by mouth daily. Take after 12 weeks for prevention of preeclampssia later in pregnancy 300 tablet 2  . cetirizine (ZYRTEC) 10 MG tablet Take 10 mg by mouth daily.    . flintstones complete (FLINTSTONES) 60 MG chewable tablet Chew 1 tablet by mouth daily.    Marland Kitchen omeprazole (PRILOSEC) 10 MG capsule Take 10 mg by mouth daily.    . ondansetron (ZOFRAN ODT) 4 MG disintegrating tablet Take 1 tablet (4 mg total) by mouth every 6 (six) hours as needed for nausea. 20 tablet 1   No current facility-administered medications on file prior to visit.     Objective:     Vitals:   03/07/18 1410  BP: 134/82  Pulse: (!) 144  Temp: 98.2 F (36.8 C)              UA consistent with UTI- RBCs noted.  Assessment:    G2P1 Patient  Active Problem List   Diagnosis Date Noted  . Obesity in pregnancy 03/01/2018  . Leucocytosis 02/08/2018  . B12 deficiency 02/01/2018  . Low vitamin B12 level 12/28/2017  . Goals of care, counseling/discussion 12/28/2017  . Iron deficiency anemia 11/16/2017   . History of anemia 11/09/2017  . Anemia of pregnancy in third trimester 11/09/2017  . H/O cesarean section complicating pregnancy 56/43/3295  . Marijuana abuse 11/09/2017  . History of pregnancy induced hypertension 11/09/2017  . Advanced maternal age in multigravida 11/09/2017  . Atypical squamous cell changes of undetermined significance (ASCUS) on cervical cytology with positive high risk human papilloma virus (HPV) 11/09/2017     1. Dysuria   2. Acute cystitis with hematuria     Symptoms and UA likely consistent with acute cystitis   Plan:            1.  We will treat with Macrobid for 7 days. Orders Orders Placed This Encounter  Procedures  . Urine Culture  . POCT urinalysis dipstick     Meds ordered this encounter  Medications  . nitrofurantoin, macrocrystal-monohydrate, (MACROBID) 100 MG capsule    Sig: Take 1 capsule (100 mg total) by mouth 2 (two) times daily.    Dispense:  14 capsule    Refill:  1      F/U  No follow-ups on file. I spent 16 minutes involved in the care of this patient of which greater than 50% was spent discussing UTI, treatment, symptoms, history of frequent UTIs, diagnosis of thalassemia and subsequent chromosomal follow-up, upcoming cardiology consult and current symptoms.  We have also briefly discussed her cesarean delivery.  Finis Bud, M.D. 03/07/2018 2:27 PM

## 2018-03-08 ENCOUNTER — Other Ambulatory Visit: Payer: Self-pay

## 2018-03-08 ENCOUNTER — Ambulatory Visit (INDEPENDENT_AMBULATORY_CARE_PROVIDER_SITE_OTHER): Payer: Medicaid Other | Admitting: Internal Medicine

## 2018-03-08 ENCOUNTER — Emergency Department: Payer: Medicaid Other

## 2018-03-08 ENCOUNTER — Encounter: Payer: Self-pay | Admitting: Internal Medicine

## 2018-03-08 ENCOUNTER — Inpatient Hospital Stay: Payer: Medicaid Other

## 2018-03-08 ENCOUNTER — Emergency Department
Admission: EM | Admit: 2018-03-08 | Discharge: 2018-03-08 | Disposition: A | Discharge status: Home / Self Care | Payer: Medicaid Other | Attending: Emergency Medicine | Admitting: Emergency Medicine

## 2018-03-08 ENCOUNTER — Encounter: Payer: Self-pay | Admitting: Hematology and Oncology

## 2018-03-08 ENCOUNTER — Inpatient Hospital Stay (HOSPITAL_BASED_OUTPATIENT_CLINIC_OR_DEPARTMENT_OTHER): Payer: Medicaid Other | Admitting: Hematology and Oncology

## 2018-03-08 ENCOUNTER — Other Ambulatory Visit: Payer: Self-pay | Admitting: *Deleted

## 2018-03-08 VITALS — BP 157/83 | HR 120 | Temp 97.7°F | Resp 18 | Wt 245.6 lb

## 2018-03-08 VITALS — BP 138/88 | HR 119 | Ht 64.5 in | Wt 245.0 lb

## 2018-03-08 DIAGNOSIS — R0789 Other chest pain: Secondary | ICD-10-CM | POA: Diagnosis not present

## 2018-03-08 DIAGNOSIS — R0602 Shortness of breath: Secondary | ICD-10-CM | POA: Insufficient documentation

## 2018-03-08 DIAGNOSIS — O99343 Other mental disorders complicating pregnancy, third trimester: Secondary | ICD-10-CM | POA: Diagnosis not present

## 2018-03-08 DIAGNOSIS — O99413 Diseases of the circulatory system complicating pregnancy, third trimester: Secondary | ICD-10-CM | POA: Diagnosis present

## 2018-03-08 DIAGNOSIS — F319 Bipolar disorder, unspecified: Secondary | ICD-10-CM | POA: Insufficient documentation

## 2018-03-08 DIAGNOSIS — R000 Tachycardia, unspecified: Secondary | ICD-10-CM | POA: Diagnosis not present

## 2018-03-08 DIAGNOSIS — F1590 Other stimulant use, unspecified, uncomplicated: Secondary | ICD-10-CM | POA: Insufficient documentation

## 2018-03-08 DIAGNOSIS — F419 Anxiety disorder, unspecified: Secondary | ICD-10-CM | POA: Diagnosis not present

## 2018-03-08 DIAGNOSIS — Z7982 Long term (current) use of aspirin: Secondary | ICD-10-CM | POA: Insufficient documentation

## 2018-03-08 DIAGNOSIS — O99323 Drug use complicating pregnancy, third trimester: Secondary | ICD-10-CM | POA: Diagnosis not present

## 2018-03-08 DIAGNOSIS — D509 Iron deficiency anemia, unspecified: Secondary | ICD-10-CM

## 2018-03-08 DIAGNOSIS — R112 Nausea with vomiting, unspecified: Secondary | ICD-10-CM

## 2018-03-08 DIAGNOSIS — R5383 Other fatigue: Secondary | ICD-10-CM | POA: Diagnosis not present

## 2018-03-08 DIAGNOSIS — R079 Chest pain, unspecified: Secondary | ICD-10-CM | POA: Insufficient documentation

## 2018-03-08 DIAGNOSIS — Z800 Family history of malignant neoplasm of digestive organs: Secondary | ICD-10-CM

## 2018-03-08 DIAGNOSIS — Z3A33 33 weeks gestation of pregnancy: Secondary | ICD-10-CM | POA: Insufficient documentation

## 2018-03-08 DIAGNOSIS — O163 Unspecified maternal hypertension, third trimester: Secondary | ICD-10-CM | POA: Insufficient documentation

## 2018-03-08 DIAGNOSIS — E538 Deficiency of other specified B group vitamins: Secondary | ICD-10-CM | POA: Diagnosis not present

## 2018-03-08 DIAGNOSIS — Z79899 Other long term (current) drug therapy: Secondary | ICD-10-CM | POA: Diagnosis not present

## 2018-03-08 DIAGNOSIS — O99013 Anemia complicating pregnancy, third trimester: Secondary | ICD-10-CM

## 2018-03-08 DIAGNOSIS — O9989 Other specified diseases and conditions complicating pregnancy, childbirth and the puerperium: Secondary | ICD-10-CM | POA: Diagnosis not present

## 2018-03-08 DIAGNOSIS — O99513 Diseases of the respiratory system complicating pregnancy, third trimester: Secondary | ICD-10-CM | POA: Insufficient documentation

## 2018-03-08 LAB — BASIC METABOLIC PANEL
ANION GAP: 11 (ref 5–15)
BUN: 6 mg/dL (ref 6–20)
CO2: 19 mmol/L — ABNORMAL LOW (ref 22–32)
Calcium: 9 mg/dL (ref 8.9–10.3)
Chloride: 103 mmol/L (ref 101–111)
Creatinine, Ser: 0.44 mg/dL (ref 0.44–1.00)
GFR calc Af Amer: >60 mL/min (ref >60)
Glucose, Bld: 103 mg/dL — ABNORMAL HIGH (ref 65–99)
POTASSIUM: 3.8 mmol/L (ref 3.5–5.1)
Sodium: 133 mmol/L — ABNORMAL LOW (ref 135–145)

## 2018-03-08 LAB — URINALYSIS, COMPLETE (UACMP) WITH MICROSCOPIC
BACTERIA UA: NONE SEEN
Bilirubin Urine: NEGATIVE
GLUCOSE, UA: NEGATIVE mg/dL
HGB URINE DIPSTICK: NEGATIVE
Ketones, ur: 80 mg/dL — AB
Leukocytes, UA: NEGATIVE
NITRITE: NEGATIVE
PROTEIN: NEGATIVE mg/dL
SPECIFIC GRAVITY, URINE: 1.012 (ref 1.005–1.030)
pH: 6 (ref 5.0–8.0)

## 2018-03-08 LAB — CBC
HEMATOCRIT: 34.6 % — AB (ref 35.0–47.0)
HEMOGLOBIN: 11 g/dL — AB (ref 12.0–16.0)
MCH: 22.1 pg — ABNORMAL LOW (ref 26.0–34.0)
MCHC: 31.7 g/dL — ABNORMAL LOW (ref 32.0–36.0)
MCV: 69.8 fL — ABNORMAL LOW (ref 80.0–100.0)
Platelets: 355 10*3/uL (ref 150–440)
RBC: 4.95 MIL/uL (ref 3.80–5.20)
RDW: 19 % — AB (ref 11.5–14.5)
WBC: 13.6 10*3/uL — ABNORMAL HIGH (ref 3.6–11.0)

## 2018-03-08 LAB — TROPONIN I: Troponin I: <0.03 ng/mL (ref <0.03)

## 2018-03-08 LAB — FIBRIN DERIVATIVES D-DIMER (ARMC ONLY): FIBRIN DERIVATIVES D-DIMER (ARMC): 841.14 ng{FEU}/mL — AB (ref 0.00–499.00)

## 2018-03-08 NOTE — Progress Notes (Signed)
[facility] day: 03/08/2018     Chief Complaint: Dominique Dyer is a 37 y.o. female currently [redacted] weeks pregnant with iron deficiency anemia and B12 deficiency who is seen for a 2 week assessment.   HPI:  Patient was last seen in the hematology clinic on 02/22/2018.  At that time, she remained fatigued.  Exam was stable.  Hemoglobin was 9.2.  Ferritin was 11.  Folate was 6.3.  Hemoglobin electrophoresis on 02/22/2018 revealed 3.5% Hgb A2 (1.8-3.2%), and Hgb A 96.5%.  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  She received Venofer weekly x 2 (02/22/2018 - 03/01/2018).  She completed weekly B12 on 03/01/2018.  CBC on 03/06/2018 revealed a hematocrit of 33.8, hemoglobin 10.8, MCV 69.3, platelets 354,000, WBC 13,400 with an ANC of 10,800.  Ferritin was 197.  Symptomatically, she has been "sick" this morning. She work up at 3 am with nausea and vomiting. She was seen by Dr. Amalia Hailey (OB/GYN) for a bladder infection yesterday. She notes that she was TACHYcardic to the 140s. Patient was "profusely sweating". Patient was prescribed a 10 day course of nitrofurantoin. She will be following up with GYN again next week. Additionally, she has an appointment with cardiology next week.  Patient is having more exertional shortness of breath. She is "hot and sweaty". Patient having issues (pain and muffled sounds) with her ears after she blew her nose today. She presents to the clinic today with persistent TACHYcardia; heart rate in the 120s. She has nausea, for which she is using PRN ondansetron. Patient notes that she spends a lot of time resting at home. Patient notes that, even at rest, her heart is racing.    Patient denies pain in the clinic today.    Past Medical History:  Diagnosis Date  . Anxiety   . Bipolar disorder ([place])   . Migraines   . Panic attacks     Past Surgical History:  Procedure Laterality Date  . CESAREAN  SECTION    . COLPOSCOPY  2006    Family History  Problem Relation Age of Onset  . Cancer Paternal Uncle   . Cancer Maternal Grandfather     Social History:  reports that she has never smoked. She has never used smokeless tobacco. She reports that she has current or past drug history. Drug: Marijuana. She reports that she does not drink alcohol.  Patient is originally from [place], [place]. She lives in [place].  Patient is unemployed. Patient denies known exposures to radiation on toxins.  The patient is alone today.   Allergies:  Allergies  Allergen Reactions  . Aspirin Nausea Only and Other (See Comments)  . Naproxen Nausea Only, Other (See Comments) and Nausea And Vomiting  . Sumatriptan Succinate Rash    tingling    Current Medications: Current Outpatient Medications  Medication Sig Dispense Refill  . ALPRAZolam (XANAX) 1 MG tablet Take 1.5 mg by mouth at bedtime as needed for anxiety.    Marland Kitchen aspirin EC 81 MG tablet Take 1 tablet (81 mg total) by mouth daily. Take after 12 weeks for prevention of preeclampssia later in pregnancy 300 tablet 2  . cetirizine (ZYRTEC) 10 MG tablet Take 10 mg by mouth daily.    . flintstones complete (FLINTSTONES) 60 MG chewable tablet Chew 1 tablet by mouth daily.    . nitrofurantoin, macrocrystal-monohydrate, (MACROBID) 100 MG capsule Take 1 capsule (100 mg total) by mouth 2 (two)  times daily. 14 capsule 1  . omeprazole (PRILOSEC) 10 MG capsule Take 10 mg by mouth daily.    . ondansetron (ZOFRAN ODT) 4 MG disintegrating tablet Take 1 tablet (4 mg total) by mouth every 6 (six) hours as needed for nausea. 20 tablet 1   No current facility-administered medications for this visit.     Review of Systems  Constitutional: Positive for diaphoresis (at times), malaise/fatigue and weight loss (4 pounds in 2 weeks). Negative for fever.       Feels tired.  33 week G2P1; EDC 04/11/2018  HENT: Negative.  Negative for congestion, ear discharge, ear pain,  hearing loss, nosebleeds, sinus pain and sore throat.   Eyes: Negative.  Negative for blurred vision, double vision, pain and discharge.  Respiratory: Positive for shortness of breath (with exercise). Negative for cough, hemoptysis, sputum production and wheezing.   Cardiovascular: Negative for chest pain, palpitations, orthopnea, leg swelling and PND.       Chronic tachycardia  Gastrointestinal: Positive for nausea (intermittent) and vomiting (at 3 AM today). Negative for abdominal pain, blood in stool, constipation, diarrhea and melena.  Genitourinary: Negative for dysuria, frequency, hematuria and urgency.       Recently diagnosed with UTI  Musculoskeletal: Negative.  Negative for back pain, falls, joint pain and myalgias.  Skin: Negative.  Negative for itching and rash.  Neurological: Negative.  Negative for dizziness, tremors, weakness and headaches.  Endo/Heme/Allergies: Does not bruise/bleed easily.       Grass allergy  Psychiatric/Behavioral: Negative for depression, memory loss and suicidal ideas. The patient is not nervous/anxious and does not have insomnia.   All other systems reviewed and are negative.   Physical Exam: Blood pressure (!) 157/83, pulse (!) 120, temperature 97.7 F (36.5 C), temperature source Tympanic, resp. rate 18, weight 245 lb 9.5 oz (111.4 kg), last menstrual period 07/12/2017, SpO2 100 %. GENERAL:  Well developed, well nourished, woman sitting comfortably in the exam room in no acute distress. MENTAL STATUS:  Alert and oriented to person, place and time. HEAD:  Long dark hair pulled back.  Normocephalic, atraumatic, face symmetric, no Cushingoid features. EYES:  Brown eyes.  Pupils equal round and reactive to light and accomodation.  No conjunctivitis or scleral icterus. ENT:  Oropharynx clear without lesion.  Tongue normal. Mucous membranes moist.  RESPIRATORY:  Clear to auscultation without rales, wheezes or rhonchi. CARDIOVASCULAR:  Regular rate and  rhythm without murmur, rub or gallop. ABDOMEN:  Pregnant.  Infant moving.  Soft, non-tender, with active bowel sounds, and no hepatosplenomegaly.  No masses. SKIN:  No rashes, ulcers or lesions. EXTREMITIES: No edema, no skin discoloration or tenderness.  No palpable cords. LYMPH NODES: No palpable cervical, supraclavicular, axillary or inguinal adenopathy  NEUROLOGICAL: Unremarkable. PSYCH:  Appropriate.   Office Visit on 03/07/2018  Component Date Value Ref Range Status  . Color, UA 03/07/2018 yellow   Final  . Clarity, UA 03/07/2018 neg   Final  . Glucose, UA 03/07/2018 Negative  Negative Final  . Bilirubin, UA 03/07/2018 1+   Final  . Ketones, UA 03/07/2018 neg   Final  . Spec Grav, UA 03/07/2018 1.020  1.010 - 1.025 Final  . Blood, UA 03/07/2018 trace non hem   Final  . pH, UA 03/07/2018 6.0  5.0 - 8.0 Final  . Protein, UA 03/07/2018 Positive* Negative Final  . Urobilinogen, UA 03/07/2018 0.2  0.2 or 1.0 E.U./dL Final  . Nitrite, UA 03/07/2018 neg   Final  . Leukocytes, UA  03/07/2018 Negative  Negative Final  . Appearance 03/07/2018 yellow   Final  . Odor 03/07/2018 neg   Final  Appointment on 03/06/2018  Component Date Value Ref Range Status  . Ferritin 03/06/2018 197  11 - 307 ng/mL Final   Performed at [facility], [address] [place], [place] [address]  . WBC 03/06/2018 13.4* 3.6 - 11.0 K/uL Final  . RBC 03/06/2018 4.89  3.80 - 5.20 MIL/uL Final  . Hemoglobin 03/06/2018 10.8* 12.0 - 16.0 g/dL Final  . HCT 03/06/2018 33.8* 35.0 - 47.0 % Final  . MCV 03/06/2018 69.3* 80.0 - 100.0 fL Final  . MCH 03/06/2018 22.1* 26.0 - 34.0 pg Final  . MCHC 03/06/2018 31.9* 32.0 - 36.0 g/dL Final  . RDW 03/06/2018 19.2* 11.5 - 14.5 % Final  . Platelets 03/06/2018 354  150 - 440 K/uL Final  . Neutrophils Relative % 03/06/2018 81  % Final  . Neutro Abs 03/06/2018 10.8* 1.4 - 6.5 K/uL Final  . Lymphocytes Relative 03/06/2018 15  % Final  . Lymphs Abs 03/06/2018 1.9  1.0 -  3.6 K/uL Final  . Monocytes Relative 03/06/2018 4  % Final  . Monocytes Absolute 03/06/2018 0.6  0.2 - 0.9 K/uL Final  . Eosinophils Relative 03/06/2018 0  % Final  . Eosinophils Absolute 03/06/2018 0.0  0 - 0.7 K/uL Final  . Basophils Relative 03/06/2018 0  % Final  . Basophils Absolute 03/06/2018 0.0  0 - 0.1 K/uL Final   Performed at [facility], [address], [place], [place] [address]    Assessment:  Dominique Dyer is a 37 y.o. female currently [redacted] weeks pregnant with iron deficiency anemia.  Diet appears good.  She has ice pica.  She denies any bleeding.  She was on oral iron.  CBC on 10/12/2017 revealed a hematocrit of 32.4, hemoglobin 9.9, and MCV 67.  Ferritin was 13 with an iron saturation of 15% and a TIBC of 440 on 11/09/2017.  Labs on 12/21/2017 revealed a hematocrit of 32.0, hemoglobin 10.2, and MCV 68.1.  Ferritin was 6.  Iron saturation 3% with a TIBC of 517.  Reticulocytes 1%.  B12 level was low at 195.  Folate normal at 13.9.  TSH was 0.893 (normal) and free T4 0.56 (0.61-1.12) on 12/28/2017.  BCR-ABL was negative on 01/25/2018.  Hemoglobin electrophoresis on 02/22/2018 revealed 3.5% Hgb A2 (1.8-3.2%), and Hgb A 96.5%.  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  She is 33 week G2P1, with an EDC of 04/11/2018.  Patient is scheduled to have a planned cesarean section.   She received Venofer weekly x 4 (02/08/2018 - 03/01/2018).  Ferritin has been followed:  13 on 11/09/2017, 6 on 12/21/2017, 12 on 01/24/2018, 11 on 02/07/2018, and 197 on 03/06/2018.  She was on oral iron.  She has B12 deficiency.  B12 was 195 on 12/21/2017 and 177 on 01/24/2018.  She was on oral B12.  She began B12 injections on 01/25/2018 (last 03/01/2018).  Folate was 6.3 on 02/22/2018.  Symptomatically, she remains fatigued.  She has persistent tachycardia.  Exam is stable.  Hemoglobin is 10.8.  Plan: 1. Review labs from last 2 visits.  Hemoglobin has  improved.  Ferritin is normal.  MCV remains low. 2. No Venofer today. 3. Discuss possibility of beta thalassemia minor.  Discuss possible carrier state.  Typically patients are asymptomatic with mild microcytic RBC indices. 4. Discuss TACHYcardia. Discussed with Dr. Amalia Hailey (OB/GYN). EKG  shows ST at a rate of 102. Called to see if we could expedite cardiology appointment. Dr. Saunders Revel will see patient this afternoon at 4:00 pm  5. Continue B12 monthly (next due 03/29/2018). 6. RTC on 03/29/2018 for MD assessment, labs (CBC with diff, ferritin), and B12.   Honor Loh, NP  03/08/2018, 11:04 AM  I saw and evaluated the patient, participating in the key portions of the service and reviewing pertinent diagnostic studies and records.  I reviewed the nurse practitioner's note and agree with the findings and the plan.  The assessment and plan were discussed with the patient.  Several questions were asked by the patient and answered.   Nolon Stalls, MD 03/08/2018,11:04 AM

## 2018-03-08 NOTE — ED Triage Notes (Signed)
Chest pain, tachycardia and SOB. Pt 33 weeks. Pt reports HR of 120-140's throughout pregnancy. Pt alert and oriented X4, active, cooperative, pt in NAD. RR even and unlabored, color WNL.

## 2018-03-08 NOTE — Discharge Instructions (Addendum)
Please seek medical attention for any high fevers, chest pain, shortness of breath, change in behavior, persistent vomiting, bloody stool or any other new or concerning symptoms.  

## 2018-03-08 NOTE — Progress Notes (Signed)
New Outpatient Visit Date: 03/08/2018  Referring Provider: [name], MD [address] [place], [place] [address]  Chief Complaint: Elevated heart rate  HPI:  Dominique Dyer is a 37 y.o. female who is being seen today for the evaluation of tachycardia at the request of Dr. Amalia Hailey.  She was originally scheduled to see me next week, but her appointment was moved up urgently today due to tachycardia noted in the hematology clinic.  She has a history of bipolar disorder, anxiety with panic attacks, and migraines.  She is currently 34wd1 pregnant.  Since he has been complicated by iron deficiency anemia for which she is followed by Dr. Mike Gip and has been receiving iron infusions.  Today, Dominique Dyer reports that she has had elevated heart rates beginning approximately 2 months into her pregnancy.  Her typical resting heart rate is around 110 to 120 bpm.  She was concerned by readings up to 140 beats per minute that occurred yesterday in the setting of UTI.  She was started on Macrobid yesterday.  She has also experienced frequent chest pain that she describes as sharp moderately-intense discomfort along the left side of her chest worsened by exertion and deep inspiration.  Pain is present most days.  She also notes occasional bandlike discomfort around the chest.  She is frequently short of breath, now present with minimal activity such as walking from the waiting room to our office.  She has mild lower extremity edema, which she attributes to her pregnancy.  She noted significant edema during her first pregnancy as well.  She did not, however, have chest pain and shortness of breath that she has been experiencing this time.  She also notes orthopnea, PND, and lightheadedness.  Her vision is also been a little bit off, though she is unable to characterize this further.  Dominique Dyer reports a history of palpitations in the past, most pronounced around the time that her husband died 2  years ago.  At one time, she was told that she may have atrial fibrillation and obstructive sleep apnea by a neurologist.  She has not had further cardiac evaluation.  --------------------------------------------------------------------------------------------------  Cardiovascular History & Procedures: Cardiovascular Problems:  Tachycardia  Typical chest pain  Risk Factors:  None  Cath/PCI:  None  CV Surgery:  None  EP Procedures and Devices:  None  Non-Invasive Evaluation(s):  None  Recent CV Pertinent Labs: Lab Results  Component Value Date   K 3.6 12/28/2017   K 3.1 (L) 05/10/2014   BUN 7 12/28/2017   BUN 14 05/10/2014   CREATININE 0.52 12/28/2017   CREATININE 1.10 05/10/2014    --------------------------------------------------------------------------------------------------  Past Medical History:  Diagnosis Date  . Anxiety   . Bipolar disorder ([place])   . Migraines   . Panic attacks     Past Surgical History:  Procedure Laterality Date  . CESAREAN SECTION    . COLPOSCOPY  2006    Current Meds  Medication Sig  . ALPRAZolam (XANAX) 1 MG tablet Take 1.5 mg by mouth at bedtime as needed for anxiety.  Marland Kitchen aspirin EC 81 MG tablet Take 1 tablet (81 mg total) by mouth daily. Take after 12 weeks for prevention of preeclampssia later in pregnancy  . cetirizine (ZYRTEC) 10 MG tablet Take 10 mg by mouth daily.  . flintstones complete (FLINTSTONES) 60 MG chewable tablet Chew 1 tablet by mouth daily.  . nitrofurantoin, macrocrystal-monohydrate, (MACROBID) 100 MG capsule Take 1 capsule (100 mg total) by mouth  2 (two) times daily.  Marland Kitchen omeprazole (PRILOSEC) 10 MG capsule Take 10 mg by mouth daily.  . ondansetron (ZOFRAN ODT) 4 MG disintegrating tablet Take 1 tablet (4 mg total) by mouth every 6 (six) hours as needed for nausea.    Allergies: Aspirin; Naproxen; and Sumatriptan succinate  Social History   Tobacco Use  . Smoking status: Never Smoker  .  Smokeless tobacco: Never Used  Substance Use Topics  . Alcohol use: No  . Drug use: Not Currently    Types: Marijuana    Comment: Last used several days ago    Family History  Problem Relation Age of Onset  . Cancer Paternal Uncle   . Cancer Maternal Grandfather   . Heart attack Mother 34  . Seizures Father   . Heart attack Paternal Grandfather     Review of Systems: A 12-system review of systems was performed and was negative except as noted in the HPI.  --------------------------------------------------------------------------------------------------  Physical Exam: BP 138/88 (BP Location: Left Arm, Patient Position: Sitting, Cuff Size: Large)   Pulse (!) 119   Ht 5' 4.5" (1.638 m)   Wt 245 lb (111.1 kg)   LMP 07/12/2017   BMI 41.40 kg/m   General: Overweight woman seated comfortably in the exam room. HEENT: No conjunctival pallor or scleral icterus. Moist mucous membranes. OP clear. Neck: Supple without lymphadenopathy, thyromegaly, JVD, or HJR. No carotid bruit. Lungs: Normal work of breathing. Clear to auscultation bilaterally without wheezes or crackles. Heart: Tachycardic but regular without murmurs, rubs, or gallops.  Unable to assess PMI due to body habitus. Abd: Bowel sounds present. Soft and nontender.  Gravid uterus noted.  Unable to assess HSM due to body habitus. Ext: Trace pretibial edema bilaterally. Radial, PT, and DP pulses are 2+ bilaterally Skin: Warm and dry without rash. Neuro: CNIII-XII intact. Strength and fine-touch sensation intact in upper and lower extremities bilaterally. Psych: Normal mood and affect.  EKG: Sinus tachycardia paren heart rate 119 bpm) and possible left atrial enlargement.  Otherwise, no significant abnormalities.  Lab Results  Component Value Date   WBC 13.4 (H) 03/06/2018   HGB 10.8 (L) 03/06/2018   HCT 33.8 (L) 03/06/2018   MCV 69.3 (L) 03/06/2018   PLT 354 03/06/2018    Lab Results  Component Value Date   NA 132  (L) 12/28/2017   K 3.6 12/28/2017   CL 101 12/28/2017   CO2 23 12/28/2017   BUN 7 12/28/2017   CREATININE 0.52 12/28/2017   GLUCOSE 116 (H) 12/28/2017   ALT 12 (L) 06/20/2017    No results found for: CHOL, HDL, LDLCALC, LDLDIRECT, TRIG, CHOLHDL   --------------------------------------------------------------------------------------------------  ASSESSMENT AND PLAN: Sinus tachycardia EKG demonstrates sinus tachycardia which is a physiologic response to some other stressor.  Heart rate typically increases during pregnancy, though resting tachycardia near 120 bpm is somewhat excessive.  Active infection and anemia are certainly contributing factors, though given shortness of breath and chest pain, other causes such as pulmonary embolism and cardiomyopathy must be entertained.  Given progressive symptoms and shortness of breath with minimal activity, I recommended that Dominique Dyer be evaluated further for pulmonary embolism and heart failure in the emergency department.  Shortness of breath and atypical chest pain Symptoms are atypical for myocardial ischemia.  While pregnancy is a risk factor for spontaneous coronary artery dissection, the time course and EKG without ischemic changes argue against this.  I am somewhat concerned about pulmonary embolism, given pleuritic nature of her chest pain, though  pain has been present for several months.  She would benefit from evaluation for PE in the ED as well as expedited echocardiogram.  Elevated blood pressure Blood pressures noted to be somewhat elevated with Dr. Mike Gip today at 157/83.  It is improved but still higher than expected  in our office at 138/88.  Given shortness of breath and edema in the setting of third trimester pregnancy, preeclampsia is a consideration.  Further evaluation by the ED team and OB is suggested.  Iron deficiency anemia Currently being evaluated and treated by hematology.  Further management per Dr.  Mike Gip.  Follow-up: To be determined based on outcome of the ED evaluation.  Nelva Bush, MD 03/08/2018 5:35 PM

## 2018-03-08 NOTE — Progress Notes (Signed)
Patient currently on Macrobid for bladder infection.  HR increased - 120 today.  Has appointment with cardiologist next week.  Has had N/V multiple times since 3 am today.

## 2018-03-08 NOTE — ED Provider Notes (Signed)
[facility] Emergency Department Provider Note  ____________________________________________   I have reviewed the triage vital signs and the nursing notes.   HISTORY  Chief Complaint Chest Pain and Shortness of Breath   History limited by: Not Limited   HPI Dominique Dyer is a 37 y.o. female who presents to the emergency department today at the request of her cardiologist at today's appointment. Patient was sent to cardiology because of concern for persistent tachycardia. The patient is [redacted] weeks pregnant and states that she has been having issues with tachycardia for much of the pregnancy. The patient states that recently she has also started developing chest pain and worsening shortness of breath. The chest pain is located somewhat centrally, and variably feels like tightness and sharp. She states that she has very little exertional capacity. She also complains of a cough. States that cardiologist was worried for possible etiologies of symptoms including PE.   Per medical record review patient has a history of cardiology appointment today.  Past Medical History:  Diagnosis Date  . Anxiety   . Bipolar disorder ([place])   . Migraines   . Panic attacks     Patient Active Problem List   Diagnosis Date Noted  . Sinus tachycardia 03/08/2018  . Elevated blood pressure affecting pregnancy in third trimester, antepartum 03/08/2018  . Atypical chest pain 03/08/2018  . Shortness of breath 03/08/2018  . Obesity in pregnancy 03/01/2018  . Leucocytosis 02/08/2018  . B12 deficiency 02/01/2018  . Low vitamin B12 level 12/28/2017  . Goals of care, counseling/discussion 12/28/2017  . Iron deficiency anemia 11/16/2017  . History of anemia 11/09/2017  . Anemia of pregnancy in third trimester 11/09/2017  . H/O cesarean section complicating pregnancy 71/24/5809  . Marijuana abuse 11/09/2017  . History of pregnancy induced hypertension 11/09/2017  . Advanced maternal  age in multigravida 11/09/2017  . Atypical squamous cell changes of undetermined significance (ASCUS) on cervical cytology with positive high risk human papilloma virus (HPV) 11/09/2017    Past Surgical History:  Procedure Laterality Date  . CESAREAN SECTION    . COLPOSCOPY  2006    Prior to Admission medications   Medication Sig Start Date End Date Taking? Authorizing Provider  ALPRAZolam Duanne Moron) 1 MG tablet Take 1.5 mg by mouth at bedtime as needed for anxiety.    [provider]  aspirin EC 81 MG tablet Take 1 tablet (81 mg total) by mouth daily. Take after 12 weeks for prevention of preeclampssia later in pregnancy 11/09/17   Rubie Maid, MD  cetirizine (ZYRTEC) 10 MG tablet Take 10 mg by mouth daily.    [provider]  flintstones complete (FLINTSTONES) 60 MG chewable tablet Chew 1 tablet by mouth daily.    [provider]  nitrofurantoin, macrocrystal-monohydrate, (MACROBID) 100 MG capsule Take 1 capsule (100 mg total) by mouth 2 (two) times daily. 03/07/18   [name], MD  omeprazole (PRILOSEC) 10 MG capsule Take 10 mg by mouth daily.    [provider]  ondansetron (ZOFRAN ODT) 4 MG disintegrating tablet Take 1 tablet (4 mg total) by mouth every 6 (six) hours as needed for nausea. 02/22/18   Diona Fanti, CNM    Allergies Aspirin; Naproxen; and Sumatriptan succinate  Family History  Problem Relation Age of Onset  . Cancer Paternal Uncle   . Cancer Maternal Grandfather   . Heart attack Mother 69  . Seizures Father   . Heart attack Paternal Grandfather  Social History Social History   Tobacco Use  . Smoking status: Never Smoker  . Smokeless tobacco: Never Used  Substance Use Topics  . Alcohol use: No  . Drug use: Not Currently    Types: Marijuana    Comment: Last used several days ago    Review of Systems Constitutional: No fevers, positive for chills. Eyes: No visual changes. ENT: No sore  throat. Cardiovascular: Positive for chest pain. Respiratory: Positive for shortness of breath. Gastrointestinal: No abdominal pain.  No nausea, no vomiting.  No diarrhea.   Genitourinary: Negative for dysuria. Musculoskeletal: Negative for back pain. Skin: Negative for rash. Neurological: Negative for headaches, focal weakness or numbness.  ____________________________________________   PHYSICAL EXAM:  VITAL SIGNS: ED Triage Vitals  Enc Vitals Group     BP 03/08/18 1659 (!) 155/89     Pulse Rate 03/08/18 1659 (!) 118     Resp 03/08/18 1659 18     Temp 03/08/18 1659 98.6 F (37 C)     Temp Source 03/08/18 1659 Oral     SpO2 03/08/18 1659 98 %     Weight 03/08/18 1700 245 lb (111.1 kg)     Height 03/08/18 1700 5\' 5"  (1.651 m)     Head Circumference --      Peak Flow --      Pain Score 03/08/18 1700 4   Constitutional: Alert and oriented.  Eyes: Conjunctivae are normal.  ENT      Head: Normocephalic and atraumatic.      Nose: No congestion/rhinnorhea.      Mouth/Throat: Mucous membranes are moist.      Neck: No stridor. Hematological/Lymphatic/Immunilogical: No cervical lymphadenopathy. Cardiovascular: Normal rate, regular rhythm.  No murmurs, rubs, or gallops.  Respiratory: Normal respiratory effort without tachypnea nor retractions. Breath sounds are clear and equal bilaterally. No wheezes/rales/rhonchi. Gastrointestinal: Soft and gravid. No rebound. No guarding.  Genitourinary: Deferred Musculoskeletal: Normal range of motion in all extremities. No lower extremity edema. Neurologic:  Normal speech and language. No gross focal neurologic deficits are appreciated.  Skin:  Skin is warm, dry and intact. No rash noted. Psychiatric: Mood and affect are normal. Speech and behavior are normal. Patient exhibits appropriate insight and judgment.  ____________________________________________    LABS (pertinent positives/negatives)  CBC wbc 13.6, hgb 11.0, plt 355 D-dimer  841 Trop <0.03 UA negative protein BMP na 133, k 3.8, cl 103, cr 0.44 ____________________________________________   EKG  I, Nance Pear, attending physician, personally viewed and interpreted this EKG  EKG Time: 1705 Rate: 99 Rhythm: normal sinus rhythm Axis: normal Intervals: qtc 431 QRS: narrow, q waves III ST changes: no st elevation Impression: abnormal ekg   ____________________________________________    RADIOLOGY  CXR Negative  ____________________________________________   PROCEDURES  Procedures  ____________________________________________   INITIAL IMPRESSION / ASSESSMENT AND PLAN / ED COURSE  Pertinent labs & imaging results that were available during my care of the patient were reviewed by me and considered in my medical decision making (see chart for details).   Patient presented to the emergency department today because of concerns for shortness of breath and chest pain.  Differential be broad including pneumonia pneumothorax PE amongst other etiologies.  Patient did have a mild leukocytosis however this appears to be her baseline.  Chest x-ray without any concerning pneumonia.  D-dimer was 841.  I had a discussion with the patient about this value.  Discussed that this is above 500 however per some of his studies would be within  normal range for a late pregnancy.  Did discuss risks and benefits of CT angios in the setting.  Did offer CT Angie of patient would like it however she felt comfortable deferring at this time.  We talked about more specific PE risk factors.  Additionally discussed with patient elevated blood pressure.  No protein in urine.  Discussed importance of following up with her OB/GYN tomorrow.  ____________________________________________   FINAL CLINICAL IMPRESSION(S) / ED DIAGNOSES  Final diagnoses:  Shortness of breath  Chest pain, unspecified type     Note: This dictation was prepared with Dragon dictation. Any  transcriptional errors that result from this process are unintentional     Nance Pear, MD 03/08/18 2240

## 2018-03-09 LAB — URINE CULTURE: Organism ID, Bacteria: NO GROWTH

## 2018-03-13 ENCOUNTER — Telehealth: Payer: Self-pay | Admitting: *Deleted

## 2018-03-13 ENCOUNTER — Encounter: Payer: Self-pay | Admitting: Internal Medicine

## 2018-03-13 DIAGNOSIS — R079 Chest pain, unspecified: Secondary | ICD-10-CM

## 2018-03-13 DIAGNOSIS — R0602 Shortness of breath: Secondary | ICD-10-CM

## 2018-03-13 NOTE — Telephone Encounter (Signed)
Routing to scheduling.  Patient needs echo in the next 1-2 days if possible.  Please let me know once scheduled. Patient should be aware as Dr End has already sent her a MyChart message. Thanks so much!

## 2018-03-13 NOTE — Telephone Encounter (Signed)
Pt is coming this Friday 03/16/18 to have echo

## 2018-03-13 NOTE — Telephone Encounter (Signed)
Is there anyway patient can have echo in one of the same day slots before Friday? She needs as soon as possible?

## 2018-03-13 NOTE — Telephone Encounter (Signed)
Lmov for patient to call back and schedule Echo sooner

## 2018-03-13 NOTE — Telephone Encounter (Signed)
-----   Message from Nelva Bush, MD sent at 03/13/2018 11:56 AM EDT ----- Regarding: Echo Hi [name],  Can you set [name] up for an echo this week, (ideally in the next 1-2 days) for evaluation of shortness of breath and chest pain?  Thanks.  Gerald Stabs

## 2018-03-15 ENCOUNTER — Encounter: Payer: Self-pay | Admitting: Obstetrics and Gynecology

## 2018-03-15 ENCOUNTER — Ambulatory Visit: Payer: Medicaid Other | Admitting: Internal Medicine

## 2018-03-15 ENCOUNTER — Ambulatory Visit (INDEPENDENT_AMBULATORY_CARE_PROVIDER_SITE_OTHER): Payer: Medicaid Other | Admitting: Obstetrics and Gynecology

## 2018-03-15 VITALS — BP 126/78 | HR 117 | Wt 258.0 lb

## 2018-03-15 DIAGNOSIS — O1213 Gestational proteinuria, third trimester: Secondary | ICD-10-CM

## 2018-03-15 DIAGNOSIS — O34219 Maternal care for unspecified type scar from previous cesarean delivery: Secondary | ICD-10-CM

## 2018-03-15 DIAGNOSIS — O99210 Obesity complicating pregnancy, unspecified trimester: Secondary | ICD-10-CM

## 2018-03-15 DIAGNOSIS — R000 Tachycardia, unspecified: Secondary | ICD-10-CM

## 2018-03-15 DIAGNOSIS — O09523 Supervision of elderly multigravida, third trimester: Secondary | ICD-10-CM

## 2018-03-15 LAB — POCT URINALYSIS DIPSTICK
Bilirubin, UA: NEGATIVE
Blood, UA: NEGATIVE
GLUCOSE UA: NEGATIVE
Ketones, UA: NEGATIVE
LEUKOCYTES UA: NEGATIVE
Nitrite, UA: NEGATIVE
PH UA: 7.5 (ref 5.0–8.0)
PROTEIN UA: POSITIVE — AB
Spec Grav, UA: 1.015 (ref 1.010–1.025)
Urobilinogen, UA: 0.2 E.U./dL

## 2018-03-15 NOTE — Addendum Note (Signed)
Addended by: Edwyna Shell on: 03/15/2018 09:48 AM   Modules accepted: Orders

## 2018-03-15 NOTE — Progress Notes (Signed)
ROB-pt stated that she is doing well. Pt stated having braxton hicks contractions throughout the day with pressure in right side.

## 2018-03-15 NOTE — Progress Notes (Signed)
ROB: Patient had her cardiology consult and additional testing in the emergency department.  She is awaiting results.  She has a follow-up appointment in 2 days.  I have asked her to contact us with the results or have her cardiologist called when he has them.  Urinary protein today-UPC ordered.  GC/CT-GBS performed.

## 2018-03-16 ENCOUNTER — Telehealth: Payer: Self-pay | Admitting: Obstetrics and Gynecology

## 2018-03-16 ENCOUNTER — Telehealth: Payer: Self-pay

## 2018-03-16 LAB — PROTEIN / CREATININE RATIO, URINE
CREATININE, UR: 165.6 mg/dL
PROTEIN/CREAT RATIO: 364 mg/g{creat} — AB (ref 0–200)
Protein, Ur: 60.2 mg/dL

## 2018-03-16 NOTE — Telephone Encounter (Signed)
The patient left a voicemail at 1:14 today asking if she needed to come in earlier than next week; 03/22/18 @ 3:45 w/ Marcelline Mates, due to her recent pre-eclampsia workup results that she saw in her MyChart today.  Her call back number is [phone], please advise, thanks.

## 2018-03-16 NOTE — Telephone Encounter (Signed)
Pt was called no answer LM via voicemail that [facility] was not in the office on Monday and would return on Tuesday and her schedule is full. Informed pt that she would receive a call tomorrow to see if she received the message.

## 2018-03-16 NOTE — Telephone Encounter (Signed)
Pt aware. Pt states she has an appt on 03/22/2018. Could she wait until then to be seen? Thanks.

## 2018-03-16 NOTE — Telephone Encounter (Signed)
-----   Message from [name], MD sent at 03/16/2018 12:58 PM EDT ----- Based on these results, please have her follow-up Monday or Tuesday for a visit.

## 2018-03-17 ENCOUNTER — Ambulatory Visit (INDEPENDENT_AMBULATORY_CARE_PROVIDER_SITE_OTHER): Payer: Medicaid Other

## 2018-03-17 ENCOUNTER — Telehealth: Payer: Self-pay | Admitting: Internal Medicine

## 2018-03-17 ENCOUNTER — Other Ambulatory Visit: Payer: Self-pay

## 2018-03-17 DIAGNOSIS — R0602 Shortness of breath: Secondary | ICD-10-CM

## 2018-03-17 DIAGNOSIS — R079 Chest pain, unspecified: Secondary | ICD-10-CM | POA: Diagnosis not present

## 2018-03-17 LAB — GC/CHLAMYDIA PROBE AMP
CHLAMYDIA, DNA PROBE: NEGATIVE
Neisseria gonorrhoeae by PCR: NEGATIVE

## 2018-03-17 LAB — STREP GP B NAA: STREP GROUP B AG: NEGATIVE

## 2018-03-17 NOTE — Telephone Encounter (Signed)
Pt aware. Appt changed to 03/20/2018 at 2:30 with DJE.

## 2018-03-17 NOTE — Telephone Encounter (Signed)
Patient calling to discuss recent Echo results and to have them sent to OBGYN for Monday appt.

## 2018-03-17 NOTE — Telephone Encounter (Signed)
I spoke with the patient. I have advised her that her echo report has not been read yet, but that it should be read this afternoon and ready for her OB/GYN appointment on Monday.  Per the patient, she is seeing Dr. Amalia Hailey at [facility] at 2:30 pm - 7/1.  Will forward to Dr. Saunders Revel as an Juluis Rainier to review.

## 2018-03-18 NOTE — Telephone Encounter (Signed)
Echo reviewed and results released to patient via [place].  No significant abnormalities noted.

## 2018-03-20 ENCOUNTER — Ambulatory Visit (INDEPENDENT_AMBULATORY_CARE_PROVIDER_SITE_OTHER): Payer: Medicaid Other | Admitting: Obstetrics and Gynecology

## 2018-03-20 ENCOUNTER — Encounter: Payer: Self-pay | Admitting: Obstetrics and Gynecology

## 2018-03-20 VITALS — BP 132/80 | HR 113 | Wt 253.0 lb

## 2018-03-20 DIAGNOSIS — O09523 Supervision of elderly multigravida, third trimester: Secondary | ICD-10-CM

## 2018-03-20 DIAGNOSIS — Z3A35 35 weeks gestation of pregnancy: Secondary | ICD-10-CM

## 2018-03-20 DIAGNOSIS — R000 Tachycardia, unspecified: Secondary | ICD-10-CM

## 2018-03-20 LAB — POCT URINALYSIS DIPSTICK
Blood, UA: NEGATIVE
CLARITY UA: NEGATIVE
GLUCOSE UA: NEGATIVE
Ketones, UA: NEGATIVE
NITRITE UA: NEGATIVE
Odor: NEGATIVE
PROTEIN UA: POSITIVE — AB
Spec Grav, UA: 1.025 (ref 1.010–1.025)
Urobilinogen, UA: 0.2 E.U./dL
pH, UA: 6 (ref 5.0–8.0)

## 2018-03-20 NOTE — Progress Notes (Signed)
ROB: Patient without complaint today.  Additional cardiology consult noted with negative echocardiogram.  Urinary protein decreased today.  No evidence of preeclampsia other than small amount of urinary protein. (Patient has a history of PIH).  5 pound decrease in weight since last visit.

## 2018-03-20 NOTE — Progress Notes (Signed)
ROB- discuss labs.

## 2018-03-20 NOTE — Telephone Encounter (Addendum)
A copy of the patient's echo report was sent to Dr. Rebekah Chesterfield, via Epic, for her appointment this afternoon.  A message was sent to the scheduling pool to please call her to arrange a follow up appointment to re-evaluate her symptoms.

## 2018-03-22 ENCOUNTER — Encounter: Payer: Medicaid Other | Admitting: Obstetrics and Gynecology

## 2018-03-22 ENCOUNTER — Ambulatory Visit (INDEPENDENT_AMBULATORY_CARE_PROVIDER_SITE_OTHER): Payer: Medicaid Other | Admitting: Physician Assistant

## 2018-03-22 ENCOUNTER — Encounter: Payer: Self-pay | Admitting: Obstetrics and Gynecology

## 2018-03-22 ENCOUNTER — Encounter: Payer: Self-pay | Admitting: Physician Assistant

## 2018-03-22 VITALS — BP 122/76 | HR 99 | Ht 64.5 in | Wt 255.0 lb

## 2018-03-22 DIAGNOSIS — R0789 Other chest pain: Secondary | ICD-10-CM | POA: Diagnosis not present

## 2018-03-22 DIAGNOSIS — R000 Tachycardia, unspecified: Secondary | ICD-10-CM | POA: Diagnosis not present

## 2018-03-22 DIAGNOSIS — R0602 Shortness of breath: Secondary | ICD-10-CM

## 2018-03-22 DIAGNOSIS — O163 Unspecified maternal hypertension, third trimester: Secondary | ICD-10-CM

## 2018-03-22 DIAGNOSIS — D509 Iron deficiency anemia, unspecified: Secondary | ICD-10-CM

## 2018-03-22 NOTE — Telephone Encounter (Signed)
Pt is coming in today for her appointment.

## 2018-03-22 NOTE — Progress Notes (Signed)
Cardiology Office Note Date:  03/22/2018  Patient ID:  Dominique Dyer, Dominique Dyer 1980-10-22, MRN [id number] PCP:  Valera Castle, MD  Cardiologist:  Dr. Saunders Revel, MD    Chief Complaint: Follow up  History of Present Illness: Dominique Dyer is a 37 y.o. female with history of bipolar disorder, anxiety with panic attacks, iron deficiency anemia receiving iron infusions, palpitations, and is currently 36 weeks 1 day pregnant who presents for follow up of SOB/chest pain/sinus tachycardia.   Patient was evaluated by Dr. Saunders Revel on 03/08/2018 for tachycardia. Patient's appointment with Dr. Saunders Revel was bumped up to the above date given the patient was noted to be tachycardia in the hematology office on 6/19 with a heart rate of 120 bpm. Patient reported elevated heart rates ~ 2 months into her pregnancy with a typical resting heart rate around 110-120 bpm. She has also noted a reading as high as 140 bpm in the setting of a UTI, now s/p Macrobid. She has noted associated sharp chest pain that is left-sided and exacerbated by exertion and deep inspiration. She has noted SOB with minimal exertion as well as significant edema (previously noted during her first pregnancy), orthopnea, PND, and lightheadedness. She reports a history of palpitations, most pronounced around the death of her husband ~ 2 years prior. She also stated a neurologist told her she may have Afib and OSA, though did not seek cardiology evaluation at that time. Her EKG on 03/08/2018 demonstrated sinus tachycardia, which was felt to be a physiologic response to some other stressor. There was some concern her symptoms may have been related to her underlying pregnancy, recent infection and underlying anemia; however, it was felt best that she be evaluated in the ED for possible PE and CM given her progression of symptoms. She was sent to the ED on 6/19 with CXR being not acute, D-dimer elevated at 841,14, troponin negative x 1, WBC 13.6, HGB 11.0, PLT 355,  Na 133, K+ 3.8, glucose 103. Patient declined further evaluation of PE at that time and was advised to follow up with OB. She underwent echo on 03/17/2018 that showed an EF of 55-60%, no RWMA, Gr1DD, no significant valvular abnormalities, RV cavity size was normal with normal wall thickness and normal RV systolic function, unable to estimate PASP.   She comes in today for follow up noting she feels about the same. She continues to feel SOB with exertion. She notes a resting heart rate in the low 100s bpm mostly. She continues to have intermittent, left sided sharp chest pain. Has had some today, though not as severe as prior episodes. Blood pressure at home has been running in the 132G systolic. Of note, when the patient was seen in the ED on 6/19 the ED MD she saw was the same MD that told her that her husband had passed away. She reports increasing anxiety each time he came in the room and feels like this may have played a role in her elevated BP at that time.      Past Medical History:  Diagnosis Date  . Anxiety   . Bipolar disorder ([place])   . Migraines   . Panic attacks     Past Surgical History:  Procedure Laterality Date  . CESAREAN SECTION    . COLPOSCOPY  2006    Current Meds  Medication Sig  . ALPRAZolam (XANAX) 1 MG tablet Take 1.5 mg by mouth at bedtime as needed for anxiety.  Marland Kitchen aspirin EC 81 MG  tablet Take 1 tablet (81 mg total) by mouth daily. Take after 12 weeks for prevention of preeclampssia later in pregnancy  . cetirizine (ZYRTEC) 10 MG tablet Take 10 mg by mouth daily.  . flintstones complete (FLINTSTONES) 60 MG chewable tablet Chew 1 tablet by mouth daily.  Marland Kitchen omeprazole (PRILOSEC) 10 MG capsule Take 10 mg by mouth daily.  . ondansetron (ZOFRAN ODT) 4 MG disintegrating tablet Take 1 tablet (4 mg total) by mouth every 6 (six) hours as needed for nausea.    Allergies:   Aspirin; Naproxen; and Sumatriptan succinate   Social History:  The patient  reports that she has  never smoked. She has never used smokeless tobacco. She reports that she has current or past drug history. Drug: Marijuana. She reports that she does not drink alcohol.   Family History:  The patient's family history includes Cancer in her maternal grandfather and paternal uncle; Heart attack in her paternal grandfather; Heart attack (age of onset: 54) in her mother; Seizures in her father.  ROS:   Review of Systems  Constitutional: Positive for malaise/fatigue. Negative for chills, diaphoresis, fever and weight loss.  HENT: Negative for congestion.   Eyes: Negative for discharge and redness.  Respiratory: Positive for shortness of breath. Negative for cough, hemoptysis, sputum production and wheezing.   Cardiovascular: Positive for chest pain, palpitations and leg swelling. Negative for orthopnea, claudication and PND.  Gastrointestinal: Negative for abdominal pain, blood in stool, heartburn, melena, nausea and vomiting.  Genitourinary: Negative for hematuria.  Musculoskeletal: Negative for falls and myalgias.  Skin: Negative for rash.  Neurological: Positive for weakness. Negative for dizziness, tingling, tremors, sensory change, speech change, focal weakness and loss of consciousness.  Endo/Heme/Allergies: Does not bruise/bleed easily.  Psychiatric/Behavioral: Negative for substance abuse. The patient is nervous/anxious.   All other systems reviewed and are negative.    PHYSICAL EXAM:  VS:  BP 122/76 (BP Location: Left Arm, Patient Position: Sitting, Cuff Size: Large)   Pulse 99   Ht 5' 4.5" (1.638 m)   Wt 255 lb (115.7 kg)   LMP 07/12/2017   SpO2 99% Comment: room air  BMI 43.09 kg/m  BMI: Body mass index is 43.09 kg/m.  Physical Exam  Constitutional: She is oriented to person, place, and time. She appears well-developed and well-nourished.  HENT:  Head: Normocephalic and atraumatic.  Eyes: Right eye exhibits no discharge. Left eye exhibits no discharge.  Neck: Normal range  of motion. No JVD present.  Cardiovascular: Normal rate, regular rhythm, S1 normal and S2 normal. Exam reveals no distant heart sounds, no friction rub, no midsystolic click and no opening snap.  Murmur heard. Pulses:      Posterior tibial pulses are 2+ on the right side, and 2+ on the left side.  Flow murmur  Pulmonary/Chest: Effort normal and breath sounds normal. No respiratory distress. She has no decreased breath sounds. She has no wheezes. She has no rales. She exhibits no tenderness.  Abdominal: Soft. She exhibits no distension. There is no tenderness.  Musculoskeletal: She exhibits edema.  Trace bilateral pre-tibial edema  Neurological: She is alert and oriented to person, place, and time.  Skin: Skin is warm and dry. No cyanosis. Nails show no clubbing.  Psychiatric: She has a normal mood and affect. Her speech is normal and behavior is normal. Judgment and thought content normal.     EKG:  Was ordered and interpreted by me today. Shows NSR, 99 bpm, possible left atrial enlargement, no acute changes (  unchanged from prior)    Recent Labs: 06/20/2017: ALT 12 12/28/2017: TSH 0.893 03/08/2018: BUN 6; Creatinine, Ser 0.44; Hemoglobin 11.0; Platelets 355; Potassium 3.8; Sodium 133  No results found for requested labs within last 8760 hours.   Estimated Creatinine Clearance: 122.5 mL/min (by C-G formula based on SCr of 0.44 mg/dL).   Wt Readings from Last 3 Encounters:  03/22/18 255 lb (115.7 kg)  03/20/18 253 lb (114.8 kg)  03/15/18 258 lb (117 kg)     Other studies reviewed: Additional studies/records reviewed today include: summarized above  ASSESSMENT AND PLAN:  1. Sinus tachycardia: Heart rate improved today at 99 bpm at rest. We had the patient wear a pulse ox for our entire visit today with her oxygen saturation remaining at 99% on room air with a heart rate ranging from the upper 90s bpm to 111 bpm. Suspect her sinus tachycardia is multifactorial including pregnancy,  underlying iron deficiency anemia, and obesity/overweight with physical deconditioning. We did discuss her symptoms in context of her work up to date including negative troponin, elevated d dimer, and unremarkable echo, including no evidence of RV strain/dilatation. In the setting of her improved heart rate more consistent with physiologic sinus tachycardia secondary to the above, normal pulse ox on room air, and echo that does not demonstrate findings consistent with possible PE she has decided to hold off on further imaging at this time given her developing fetus, which does in fact sound reasonable.   2. SOB/atypical chest pain: As above. Currently symptom-free.   3. Elevated BP: Blood pressure well controlled today at 122/76. Hoem BP has been well controlled in the 010U systolic. She feels like her BP was elevated in the ED on 6/19 in the setting of stress/grief (the MD ED she saw that night was also the same MD that told her that her husband had passed away). Keep follow up with OB the following week. Will need close follow up for possible preeclampsia.    4. Iron deficiency anemia: HGB low, though stable on most recent check from 03/08/18. Per hematology.   Disposition: F/u with an APP or Dr. Saunders Revel in 3 months, sooner if needed.  Current medicines are reviewed at length with the patient today.  The patient did not have any concerns regarding medicines.  Signed, Christell Faith, PA-C 03/22/2018 3:13 PM     [address] [place] [address] [place], [place] [address] [phone]

## 2018-03-22 NOTE — Patient Instructions (Signed)
Medication Instructions: - Your physician recommends that you continue on your current medications as directed. Please refer to the Current Medication list given to you today.  Labwork: - none ordered  Procedures/Testing: - none ordered  Follow-Up: - Your physician recommends that you schedule a follow-up appointment in: 3 months with Dr. Delorse Limber, PA. We will be in touch with you to schedule this, but feel free to call the beginning of August to see if the schedule is available.    Any Additional Special Instructions Will Be Listed Below (If Applicable).     If you need a refill on your cardiac medications before your next appointment, please call your pharmacy.

## 2018-03-27 ENCOUNTER — Encounter: Payer: Self-pay | Admitting: Obstetrics and Gynecology

## 2018-03-28 ENCOUNTER — Ambulatory Visit (INDEPENDENT_AMBULATORY_CARE_PROVIDER_SITE_OTHER): Payer: Medicaid Other | Admitting: Obstetrics and Gynecology

## 2018-03-28 VITALS — BP 134/82 | HR 113 | Wt 252.9 lb

## 2018-03-28 DIAGNOSIS — O26899 Other specified pregnancy related conditions, unspecified trimester: Secondary | ICD-10-CM

## 2018-03-28 DIAGNOSIS — G5600 Carpal tunnel syndrome, unspecified upper limb: Secondary | ICD-10-CM

## 2018-03-28 DIAGNOSIS — O26893 Other specified pregnancy related conditions, third trimester: Secondary | ICD-10-CM

## 2018-03-28 DIAGNOSIS — Z98891 History of uterine scar from previous surgery: Secondary | ICD-10-CM

## 2018-03-28 DIAGNOSIS — O34219 Maternal care for unspecified type scar from previous cesarean delivery: Secondary | ICD-10-CM

## 2018-03-28 DIAGNOSIS — O99210 Obesity complicating pregnancy, unspecified trimester: Secondary | ICD-10-CM

## 2018-03-28 DIAGNOSIS — Z3483 Encounter for supervision of other normal pregnancy, third trimester: Secondary | ICD-10-CM

## 2018-03-28 DIAGNOSIS — O99213 Obesity complicating pregnancy, third trimester: Secondary | ICD-10-CM

## 2018-03-28 DIAGNOSIS — Z3A36 36 weeks gestation of pregnancy: Secondary | ICD-10-CM

## 2018-03-28 DIAGNOSIS — G5601 Carpal tunnel syndrome, right upper limb: Secondary | ICD-10-CM | POA: Insufficient documentation

## 2018-03-28 LAB — POCT URINALYSIS DIPSTICK
Bilirubin, UA: NEGATIVE
Blood, UA: NEGATIVE
Glucose, UA: NEGATIVE
Ketones, UA: NEGATIVE
LEUKOCYTES UA: NEGATIVE
NITRITE UA: NEGATIVE
Protein, UA: POSITIVE — AB
SPEC GRAV UA: 1.01 (ref 1.010–1.025)
Urobilinogen, UA: 0.2 E.U./dL
pH, UA: 7.5 (ref 5.0–8.0)

## 2018-03-28 NOTE — Progress Notes (Signed)
ROB: Complains of numbness/tingling in hands, likely carpal tunnel. Advised on hand braces, exercises .Cultures negative.  RTC in 1 week, for pre-op at that time.

## 2018-03-28 NOTE — Progress Notes (Signed)
ROB-pt stated that her hands have been going numb. Pt stated having pressure in the pelvic area and back pain.

## 2018-03-29 ENCOUNTER — Inpatient Hospital Stay: Payer: Medicaid Other | Attending: Hematology and Oncology

## 2018-03-29 ENCOUNTER — Inpatient Hospital Stay: Payer: Medicaid Other

## 2018-03-29 ENCOUNTER — Inpatient Hospital Stay (HOSPITAL_BASED_OUTPATIENT_CLINIC_OR_DEPARTMENT_OTHER): Payer: Medicaid Other | Admitting: Urgent Care

## 2018-03-29 VITALS — BP 134/90 | HR 93 | Temp 96.7°F | Resp 18 | Wt 254.6 lb

## 2018-03-29 DIAGNOSIS — Z800 Family history of malignant neoplasm of digestive organs: Secondary | ICD-10-CM

## 2018-03-29 DIAGNOSIS — Z331 Pregnant state, incidental: Secondary | ICD-10-CM | POA: Insufficient documentation

## 2018-03-29 DIAGNOSIS — Z79899 Other long term (current) drug therapy: Secondary | ICD-10-CM

## 2018-03-29 DIAGNOSIS — E538 Deficiency of other specified B group vitamins: Secondary | ICD-10-CM | POA: Diagnosis not present

## 2018-03-29 DIAGNOSIS — R5383 Other fatigue: Secondary | ICD-10-CM

## 2018-03-29 DIAGNOSIS — O99013 Anemia complicating pregnancy, third trimester: Secondary | ICD-10-CM

## 2018-03-29 DIAGNOSIS — D509 Iron deficiency anemia, unspecified: Secondary | ICD-10-CM | POA: Diagnosis not present

## 2018-03-29 DIAGNOSIS — Z7982 Long term (current) use of aspirin: Secondary | ICD-10-CM

## 2018-03-29 DIAGNOSIS — R000 Tachycardia, unspecified: Secondary | ICD-10-CM

## 2018-03-29 DIAGNOSIS — F319 Bipolar disorder, unspecified: Secondary | ICD-10-CM | POA: Diagnosis not present

## 2018-03-29 LAB — CBC WITH DIFFERENTIAL/PLATELET
Basophils Absolute: 0 10*3/uL (ref 0–0.1)
Basophils Relative: 0 %
Eosinophils Absolute: 0 10*3/uL (ref 0–0.7)
Eosinophils Relative: 0 %
HCT: 34.8 % — ABNORMAL LOW (ref 35.0–47.0)
Hemoglobin: 10.9 g/dL — ABNORMAL LOW (ref 12.0–16.0)
Lymphocytes Relative: 16 %
Lymphs Abs: 2 10*3/uL (ref 1.0–3.6)
MCH: 22 pg — ABNORMAL LOW (ref 26.0–34.0)
MCHC: 31.3 g/dL — ABNORMAL LOW (ref 32.0–36.0)
MCV: 70.2 fL — ABNORMAL LOW (ref 80.0–100.0)
Monocytes Absolute: 0.7 10*3/uL (ref 0.2–0.9)
Monocytes Relative: 6 %
Neutro Abs: 9.8 10*3/uL — ABNORMAL HIGH (ref 1.4–6.5)
Neutrophils Relative %: 78 %
Platelets: 344 10*3/uL (ref 150–440)
RBC: 4.96 MIL/uL (ref 3.80–5.20)
RDW: 17.9 % — ABNORMAL HIGH (ref 11.5–14.5)
WBC: 12.6 10*3/uL — ABNORMAL HIGH (ref 3.6–11.0)

## 2018-03-29 MED ORDER — CYANOCOBALAMIN 1000 MCG/ML IJ SOLN
1000.0000 ug | Freq: Once | INTRAMUSCULAR | Status: AC
  Administered 2018-03-29: 1000 ug via INTRAMUSCULAR

## 2018-03-29 NOTE — Progress Notes (Signed)
Patient continues to be SOB.  Saw cardiologist last Friday.  Patient is not sleeping.  Otherwise, offers no complaints.

## 2018-03-29 NOTE — Progress Notes (Signed)
[facility] day: 03/29/2018     Chief Complaint: Dominique Dyer is a 37 y.o. female currently [redacted] weeks pregnant with iron deficiency anemia and B12 deficiency who is seen for a 3week assessment.   HPI:  Patient was last seen in the hematology clinic on 03/08/2018.  At that time, patient complained of being "sick".  Patient woke up with complaints of nausea and vomiting.  She was seen by her OB/GYN 1 day prior and diagnosed with a bladder infection.  Patient tachycardic to the 120s in clinic.  Patient was started on a 10-day course of nitrofurantoin, and is scheduled follow-up with GYN again next week.  Patient exertionally dyspneic.  She is using ondansetron on a as needed basis.  More fatigued despite extended rest periods.  EKG done in clinic that showed ST.  patient was sent from the clinic to cardiology for expedited evaluation.  She was seen in consult on 03/08/2018 by Dr. Harrell Gave End (cardiology).  Notes reviewed.  EKG repeated upon arrival to cardiology office that demonstrated sinus tachycardia at a rate of 119 with no evidence of ST depression or elevation.  While active infection and anemia were considered to be contributory to patient's elevated heart rate, cardiologist felt that a resting heart rate of 120 was excessive for this patient.  Given that the patient is currently in her third trimester pregnancy, coupled with her tachycardia and shortness of breath, patient was sent to the emergency department for further evaluation of a possible pulmonary embolism.  Patient was seen in the ED on 03/08/2018 by Dr. Nance Pear for evaluation of acute pulmonary embolism.  Notes reviewed.  At that time the patient was seen in the ED she had developed a sensation of centralized chest tightness.  EKG repeated that showed normal sinus rhythm with no evidence of ST segment elevation or depression.  WBC 13.6.  Hemoglobin 11.0, hematocrit 34.6, MCV 69.8, and  platelets 355,000.  Sodium was low at 133.  Renal function normal.  Troponin negative.  D-dimer elevated 841.14 (0-499 ng/mL).  2 view CXR normal.  Discussed risk versus benefits of CT angio of the chest.  Patient ultimately declined.  Patient was discharged home with strict return precautions and advised to follow-up with OB/GYN the following day.   Patient had echocardiogram on 03/17/2018 that revealed a normal EF of 55 to 60%.  There were no significant valvular abnormalities.  Grade 1 diastolic dysfunction noted.  Patient to follow-up with cardiology in 1 week.  Patient was seen in follow-up consult on 03/22/2018 by Christell Faith, PA (cardiology).  Notes reviewed.  EKG repeated that showed normal sinus rhythm at a rate of 99 with no ST segment changes.  Sinus tachycardia felt to be multifactorial due to patient's pregnancy, underlying IDA, obesity, and physical deconditioning.  Blood pressure and heart rate had improved overall.  While the concern still remains for possible pulmonary embolism, patient elects to defer confirmatory CT imaging due to her current pregnancy status.  Patient to follow-up with cardiology in 3 months.  Symptomatically, patient continues to note extreme fatigue.  She notes that her energy is "nonexistent".  Patient is not sleeping very well.  Patient complains of carpal tunnel symptoms in her bilateral hands, with the right being worse than left.  She notes that her hands are intermittently numb.  Patient is currently a 36-week G2, P1 scheduled for cesarean section on 04/14/2018 with Dr. Amalia Hailey.  Patient has a past medical  history of PIH.  Blood pressure stable today at 134/90.  She is following up with OB/GYN on a regular basis for routine preeclampsia assessments.  Routine urinalysis being followed for mild proteinuria.  Patient was last seen by OB/GYN on 03/28/2018, at which time she was advised everything was fine.  Patient to return to OB/GYN clinic in 1 week for preop  evaluation.  Patient denies that she has experienced any B symptoms. She denies any interval infections. Patient denies bleeding; no hematochezia, melena, or gross hematuria.  She denies recurrent urinary symptoms.  Bowels are moving well. Patient advises that she maintains an adequate appetite. She is eating well. Weight today is 254 lb 10.1 oz (115.5 kg), which compared to her last visit to the clinic, represents a 9 pound increase.  Patient denies pain in the clinic today.   Past Medical History:  Diagnosis Date  . Anxiety   . Bipolar disorder ([place])   . Migraines   . Panic attacks     Past Surgical History:  Procedure Laterality Date  . CESAREAN SECTION    . COLPOSCOPY  2006    Family History  Problem Relation Age of Onset  . Cancer Paternal Uncle   . Cancer Maternal Grandfather   . Heart attack Mother 6  . Seizures Father   . Heart attack Paternal Grandfather     Social History:  reports that she has never smoked. She has never used smokeless tobacco. She reports that she has current or past drug history. Drug: Marijuana. She reports that she does not drink alcohol.  Patient is originally from [place], [place]. She lives in [place].  Patient is unemployed. Patient denies known exposures to radiation on toxins.  The patient is alone today.   Allergies:  Allergies  Allergen Reactions  . Aspirin Nausea Only and Other (See Comments)  . Naproxen Nausea Only, Other (See Comments) and Nausea And Vomiting  . Sumatriptan Succinate Rash    tingling    Current Medications: Current Outpatient Medications  Medication Sig Dispense Refill  . ALPRAZolam (XANAX) 1 MG tablet Take 1.5 mg by mouth at bedtime as needed for anxiety.    Marland Kitchen aspirin EC 81 MG tablet Take 1 tablet (81 mg total) by mouth daily. Take after 12 weeks for prevention of preeclampssia later in pregnancy 300 tablet 2  . cetirizine (ZYRTEC) 10 MG tablet Take 10 mg by mouth daily.    . flintstones complete  (FLINTSTONES) 60 MG chewable tablet Chew 1 tablet by mouth daily.    Marland Kitchen omeprazole (PRILOSEC) 10 MG capsule Take 10 mg by mouth daily.    . ondansetron (ZOFRAN ODT) 4 MG disintegrating tablet Take 1 tablet (4 mg total) by mouth every 6 (six) hours as needed for nausea. 20 tablet 1   No current facility-administered medications for this visit.     Review of Systems  Constitutional: Negative for diaphoresis, fever, malaise/fatigue and weight loss (weight up 9 pounds).       "I am tired.  Her energy is nonexistent".  36-week G2P1 scheduled for cesarean section on 04/14/2018.  HENT: Negative.   Eyes: Negative.   Respiratory: Positive for shortness of breath (exertional). Negative for cough, hemoptysis and sputum production.   Cardiovascular: Positive for palpitations (Tachycardia of unknown etiology) and leg swelling. Negative for chest pain, orthopnea and PND.       Recent echo revealed EF of 55 to 60%.  Grade 1 diastolic dysfunction.  PMH significant for PIH  -being monitored  regularly by OB/GYN.  Gastrointestinal: Negative for abdominal pain, blood in stool, constipation, diarrhea, melena, nausea and vomiting.  Genitourinary: Negative for dysuria, frequency, hematuria and urgency.       Proteinuria.  Musculoskeletal: Positive for joint pain (carpal tunnel symptoms (R>L) -numbness in hands). Negative for back pain, falls and myalgias.  Skin: Negative for itching and rash.  Neurological: Negative for dizziness, tremors, weakness and headaches.  Endo/Heme/Allergies: Does not bruise/bleed easily.  Psychiatric/Behavioral: Negative for depression, memory loss and suicidal ideas. The patient is nervous/anxious and has insomnia.   All other systems reviewed and are negative.   Performance status (ECOG): 1 - Symptomatic but completely ambulatory  Vital signs BP 134/90 (BP Location: Left Arm, Patient Position: Sitting)   Pulse 93   Temp (!) 96.7 F (35.9 C) (Tympanic)   Resp 18   Wt 254 lb 10.1  oz (115.5 kg)   LMP 07/12/2017   SpO2 98%   BMI 43.03 kg/m   Physical Exam  Constitutional: She is oriented to person, place, and time and well-developed, well-nourished, and in no distress.  HENT:  Head: Normocephalic and atraumatic.  Long dark hair with green highlights  Eyes: Pupils are equal, round, and reactive to light. EOM are normal. No scleral icterus.  Neck: Normal range of motion. Neck supple. No tracheal deviation present. No thyromegaly present.  Cardiovascular: Normal rate, regular rhythm, S1 normal, S2 normal and normal pulses. Exam reveals no gallop and no friction rub.  Murmur heard. Pulmonary/Chest: Effort normal and breath sounds normal. No respiratory distress. She has no wheezes. She has no rales.  Abdominal: Soft. Bowel sounds are normal. She exhibits no distension. There is no tenderness.  Gavid abdomen. 36-week G2P1 with an EDC of 04/14/2018.  Plan cesarean birth. (+) fetal movement.  Musculoskeletal: Normal range of motion. She exhibits edema. She exhibits no tenderness.  Trace non-pitting edema to BLE  Lymphadenopathy:    She has no cervical adenopathy.    She has no axillary adenopathy.       Right: No inguinal and no supraclavicular adenopathy present.       Left: No inguinal and no supraclavicular adenopathy present.  Neurological: She is alert and oriented to person, place, and time.  Skin: Skin is warm and dry. No rash noted. No erythema.  Psychiatric: Affect and judgment normal. Her mood appears anxious.  Nursing note and vitals reviewed.   Appointment on 03/29/2018  Component Date Value Ref Range Status  . WBC 03/29/2018 12.6* 3.6 - 11.0 K/uL Final  . RBC 03/29/2018 4.96  3.80 - 5.20 MIL/uL Final  . Hemoglobin 03/29/2018 10.9* 12.0 - 16.0 g/dL Final  . HCT 03/29/2018 34.8* 35.0 - 47.0 % Final  . MCV 03/29/2018 70.2* 80.0 - 100.0 fL Final  . MCH 03/29/2018 22.0* 26.0 - 34.0 pg Final  . MCHC 03/29/2018 31.3* 32.0 - 36.0 g/dL Final  . RDW  03/29/2018 17.9* 11.5 - 14.5 % Final  . Platelets 03/29/2018 344  150 - 440 K/uL Final  . Neutrophils Relative % 03/29/2018 78  % Final  . Neutro Abs 03/29/2018 9.8* 1.4 - 6.5 K/uL Final  . Lymphocytes Relative 03/29/2018 16  % Final  . Lymphs Abs 03/29/2018 2.0  1.0 - 3.6 K/uL Final  . Monocytes Relative 03/29/2018 6  % Final  . Monocytes Absolute 03/29/2018 0.7  0.2 - 0.9 K/uL Final  . Eosinophils Relative 03/29/2018 0  % Final  . Eosinophils Absolute 03/29/2018 0.0  0 - 0.7 K/uL Final  .  Basophils Relative 03/29/2018 0  % Final  . Basophils Absolute 03/29/2018 0.0  0 - 0.1 K/uL Final   Performed at [facility], [address], [place], [place] [address]  Routine Prenatal on 03/28/2018  Component Date Value Ref Range Status  . Color, UA 03/28/2018 yellow   Final  . Clarity, UA 03/28/2018 clear   Final  . Glucose, UA 03/28/2018 Negative  Negative Final  . Bilirubin, UA 03/28/2018 neg   Final  . Ketones, UA 03/28/2018 neg   Final  . Spec Grav, UA 03/28/2018 1.010  1.010 - 1.025 Final  . Blood, UA 03/28/2018 neg   Final  . pH, UA 03/28/2018 7.5  5.0 - 8.0 Final  . Protein, UA 03/28/2018 Positive* Negative Final   trace  . Urobilinogen, UA 03/28/2018 0.2  0.2 or 1.0 E.U./dL Final  . Nitrite, UA 03/28/2018 neg   Final  . Leukocytes, UA 03/28/2018 Negative  Negative Final  . Appearance 03/28/2018 yellow   Final    Assessment:  Dominique Dyer is a 37 y.o. female currently [redacted] weeks pregnant with iron deficiency anemia.  Diet appears good.  She has ice pica.  She denies any bleeding.  She was on oral iron.  CBC on 10/12/2017 revealed a hematocrit of 32.4, hemoglobin 9.9, and MCV 67.  Ferritin was 13 with an iron saturation of 15% and a TIBC of 440 on 11/09/2017.  Labs on 12/21/2017 revealed a hematocrit of 32.0, hemoglobin 10.2, and MCV 68.1.  Ferritin was 6.  Iron saturation 3% with a TIBC of 517.  Reticulocytes 1%.  B12 level was low at 195.  Folate normal at 13.9.  TSH  was 0.893 (normal) and free T4 0.56 (0.61-1.12) on 12/28/2017.  BCR-ABL was negative on 01/25/2018.  Hemoglobin electrophoresis on 02/22/2018 revealed 3.5% Hgb A2 (1.8-3.2%), and Hgb A 96.5%.  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  She is 36 week G2P1, with an EDC of 04/14/2018.  Patient is scheduled to have a planned cesarean section.   She received Venofer weekly x 4 (02/08/2018 - 03/01/2018).  Ferritin has been followed:  13 on 11/09/2017, 6 on 12/21/2017, 12 on 01/24/2018, 11 on 02/07/2018, and 197 on 03/06/2018.  She was on oral iron.  She has B12 deficiency.  B12 was 195 on 12/21/2017 and 177 on 01/24/2018.  She was on oral B12.  She began B12 injections on 01/25/2018 (last 03/01/2018).  Folate was 6.3 on 02/22/2018.  Symptomatically, she remains fatigued. She is being followed by cardiology for tachycardia.  Heart rate noted to be in the upper limits of normal in clinic today.  She denies chest pain, however continues to be exertionally dyspneic. She complains of bilateral carpel tunnel symptoms (R>L); "hands are sometimes numb". No urinary symptoms to suggest persistent infection. (+) proteinuria being followed closely by OB/GYN due to her PMH significant for PIH.  Exam is stable.  WBC 12.6 ([place] 9800).  Hemoglobin 10.9, hematocrit 34.8, MCV 70.2, and platelets 344,000.  Plan: 1. Labs today: CBC with differential 2. Discuss interval consult with cardiology.  Etiology of tachycardia unknown.  Patient has had a essentially normal echocardiogram that revealed an EF of 55 to 60%.  Tachycardia felt to be multifactorial due to her pregnancy, IDA, obesity, and physical deconditioning.  Concern still exists for pulmonary embolism, however due to current pregnancy status patient has deferred confirmatory imaging.  Patient to follow-up with cardiology in 3 months or sooner if needed.  3. Discuss anemia.  Hemoglobin 10.9 with microcytic RBC indices (MCV 70.2). Patient is not  taking oral iron at this point.  Discussed need for additional intravenous iron replacement following scheduled C-section on 04/14/2018. 4. B12 injection today.  Continue monthly parenteral supplementation.arrier state. Typically patients are asymptomatic with mild microcytic RBC indices. 5. Discussed history of PIH.  Blood pressure 134/90 in clinic today.  Patient with recent proteinuria.  She is being followed closely by her OB/GYN for preeclampsia monitoring.  Continue to follow-up with OB/GYN as already scheduled. 6. RTC in 1 month for MD assessment, labs (CBC with diff, ferritin), and B12.  Honor Loh, NP  03/29/2018, 11:25 AM

## 2018-04-04 ENCOUNTER — Ambulatory Visit (INDEPENDENT_AMBULATORY_CARE_PROVIDER_SITE_OTHER): Payer: Medicaid Other | Admitting: Obstetrics and Gynecology

## 2018-04-04 ENCOUNTER — Encounter: Payer: Self-pay | Admitting: Obstetrics and Gynecology

## 2018-04-04 VITALS — BP 130/81 | HR 147 | Wt 257.3 lb

## 2018-04-04 DIAGNOSIS — O99210 Obesity complicating pregnancy, unspecified trimester: Secondary | ICD-10-CM

## 2018-04-04 DIAGNOSIS — Z3483 Encounter for supervision of other normal pregnancy, third trimester: Secondary | ICD-10-CM

## 2018-04-04 DIAGNOSIS — Z98891 History of uterine scar from previous surgery: Secondary | ICD-10-CM

## 2018-04-04 DIAGNOSIS — O34219 Maternal care for unspecified type scar from previous cesarean delivery: Secondary | ICD-10-CM

## 2018-04-04 DIAGNOSIS — R000 Tachycardia, unspecified: Secondary | ICD-10-CM

## 2018-04-04 LAB — POCT URINALYSIS DIPSTICK
Glucose, UA: NEGATIVE
KETONES UA: NEGATIVE
LEUKOCYTES UA: NEGATIVE
NITRITE UA: NEGATIVE
PROTEIN UA: NEGATIVE
RBC UA: NEGATIVE
SPEC GRAV UA: 1.01 (ref 1.010–1.025)
UROBILINOGEN UA: 0.2 U/dL
pH, UA: 7 (ref 5.0–8.0)

## 2018-04-04 NOTE — Progress Notes (Signed)
ROB - carpel tunnel pain in rt hand

## 2018-04-04 NOTE — Progress Notes (Signed)
ROB: Continues to experience carpal tunnel syndrome symptoms in her right hand.  Has started using wrist splints.  States that her tachycardia today is secondary to a very anxiety producing phone call she received in the waiting room.  Will recheck before discharge.  (Patient left despite being asked to wait)

## 2018-04-11 ENCOUNTER — Ambulatory Visit (INDEPENDENT_AMBULATORY_CARE_PROVIDER_SITE_OTHER): Payer: Medicaid Other | Admitting: Obstetrics and Gynecology

## 2018-04-11 VITALS — BP 132/83 | HR 108 | Wt 252.1 lb

## 2018-04-11 DIAGNOSIS — Z98891 History of uterine scar from previous surgery: Secondary | ICD-10-CM

## 2018-04-11 DIAGNOSIS — O34219 Maternal care for unspecified type scar from previous cesarean delivery: Secondary | ICD-10-CM

## 2018-04-11 DIAGNOSIS — Z3A40 40 weeks gestation of pregnancy: Secondary | ICD-10-CM

## 2018-04-11 DIAGNOSIS — Z3483 Encounter for supervision of other normal pregnancy, third trimester: Secondary | ICD-10-CM

## 2018-04-11 LAB — POCT URINALYSIS DIPSTICK
Blood, UA: NEGATIVE
Glucose, UA: NEGATIVE
Leukocytes, UA: NEGATIVE
NITRITE UA: NEGATIVE
PROTEIN UA: NEGATIVE
Spec Grav, UA: 1.015 (ref 1.010–1.025)
Urobilinogen, UA: 1 E.U./dL
pH, UA: 6 (ref 5.0–8.0)

## 2018-04-11 NOTE — Progress Notes (Signed)
ROB Pt states steady lower back pain with random sharp pains.

## 2018-04-11 NOTE — Progress Notes (Signed)
ROB: Patient has occasional contractions but "not labor."  Cesarean delivery scheduled for Friday.  Signs and symptoms of labor discussed.  All questions answered.

## 2018-04-13 ENCOUNTER — Encounter
Admission: RE | Admit: 2018-04-13 | Discharge: 2018-04-13 | Disposition: A | Discharge status: Home / Self Care | Payer: Medicaid Other | Source: Ambulatory Visit | Attending: Obstetrics and Gynecology | Admitting: Obstetrics and Gynecology

## 2018-04-13 ENCOUNTER — Other Ambulatory Visit: Payer: Self-pay

## 2018-04-13 HISTORY — DX: Dyspnea, unspecified: R06.00

## 2018-04-13 HISTORY — DX: Other specified eating disorder: F50.89

## 2018-04-13 HISTORY — DX: Cardiac arrhythmia, unspecified: I49.9

## 2018-04-13 HISTORY — DX: Angina pectoris, unspecified: I20.9

## 2018-04-13 HISTORY — DX: Anemia, unspecified: D64.9

## 2018-04-13 HISTORY — DX: Essential (primary) hypertension: I10

## 2018-04-13 HISTORY — DX: Gastro-esophageal reflux disease without esophagitis: K21.9

## 2018-04-13 LAB — CBC
HCT: 35.6 % (ref 35.0–47.0)
HEMOGLOBIN: 11.5 g/dL — AB (ref 12.0–16.0)
MCH: 22.6 pg — ABNORMAL LOW (ref 26.0–34.0)
MCHC: 32.3 g/dL (ref 32.0–36.0)
MCV: 69.9 fL — ABNORMAL LOW (ref 80.0–100.0)
PLATELETS: 286 10*3/uL (ref 150–440)
RBC: 5.1 MIL/uL (ref 3.80–5.20)
RDW: 17 % — ABNORMAL HIGH (ref 11.5–14.5)
WBC: 14.7 10*3/uL — ABNORMAL HIGH (ref 3.6–11.0)

## 2018-04-13 LAB — BASIC METABOLIC PANEL
Anion gap: 9 (ref 5–15)
BUN: 7 mg/dL (ref 6–20)
CHLORIDE: 105 mmol/L (ref 98–111)
CO2: 21 mmol/L — ABNORMAL LOW (ref 22–32)
CREATININE: 0.73 mg/dL (ref 0.44–1.00)
Calcium: 8.9 mg/dL (ref 8.9–10.3)
GFR calc Af Amer: >60 mL/min (ref >60)
GFR calc non Af Amer: >60 mL/min (ref >60)
GLUCOSE: 83 mg/dL (ref 70–99)
Potassium: 4.1 mmol/L (ref 3.5–5.1)
SODIUM: 135 mmol/L (ref 135–145)

## 2018-04-13 LAB — TYPE AND SCREEN
ABO/RH(D): A POS
ANTIBODY SCREEN: NEGATIVE
EXTEND SAMPLE REASON: UNDETERMINED

## 2018-04-13 MED ORDER — CEFAZOLIN SODIUM-DEXTROSE 2-4 GM/100ML-% IV SOLN: 2.0000 g | INTRAVENOUS | Status: DC

## 2018-04-13 NOTE — Patient Instructions (Signed)
Your procedure is scheduled on: Friday, April 14, 2018  Report to [facility] AT 8:00 AM    Remember: Instructions that are not followed completely may result in serious medical risk,  up to and including death, or upon the discretion of your surgeon and anesthesiologist your  surgery may need to be rescheduled.     _X__ 1. Do not eat food after midnight the night before your procedure.                 No gum chewing or hard candies. NOTHING SOLID IN YOUR MOUTH AFTER MIDNIGHT                 You may drink clear liquids up to 2 hours before you are scheduled to arrive for your surgery-                   DO not drink clear liquids within 2 hours of the start of your surgery.                  Clear Liquids include:  water, apple juice without pulp, clear carbohydrate                 drink such as Clearfast of Gatorade, Black Coffee or Tea (Do not add                 anything to coffee or tea).  __X__2.  On the morning of surgery brush your teeth with toothpaste and water,                    You may rinse your mouth with mouthwash if you wish.                        Do not swallow any toothpaste of mouthwash.     _X__ 3.  No Alcohol for 24 hours before or after surgery.   _X__ 4.  Do Not Smoke or use e-cigarettes For 24 Hours Prior to Your Surgery.                 Do not use any chewable tobacco products for at least 6 hours prior to                 surgery.  ____  5.  Bring all medications with you on the day of surgery if instructed.   ____  6.  Notify your doctor if there is any change in your medical condition      (cold, fever, infections).     Do not wear jewelry, make-up, hairpins, clips or nail polish. Do not wear lotions, powders, or perfumes. You may wear deodorant. Do not shave 48 hours prior to surgery. Men may shave face and neck. Do not bring valuables to the hospital.    [facility] is not responsible for any belongings or  valuables.  Contacts, dentures or bridgework may not be worn into surgery. Leave your suitcase in the car. After surgery it may be brought to your room. For patients admitted to the hospital, discharge time is determined by your treatment team.   Patients discharged the day of surgery will not be allowed to drive home.   Please read over the following fact sheets that you were given:   PREPARING FOR SURGERY  ____ Take these medicines the morning of surgery with A SIP OF WATER:    1. XANAX  2. [place]  3.   4.  5.  6.  ____ Fleet Enema (as directed)   _X__ Use  CHG Soap as directed  ____ Stop ASPIRIN NOW  ____ Stop Anti-inflammatories NOW!!   _X___ Stop supplements until after surgery.  NO VITAMINS ON THE MORNING OF SURGERY  ____ Bring C-Pap to the hospital.   DO NOT TAKE DIPHENHYDRAMINE ON MORNING OF SURGERY

## 2018-04-14 ENCOUNTER — Other Ambulatory Visit: Payer: Self-pay

## 2018-04-14 ENCOUNTER — Encounter
Admission: RE | Disposition: A | Discharge status: Home / Self Care | Payer: Self-pay | Source: Home / Self Care | Attending: Obstetrics and Gynecology

## 2018-04-14 ENCOUNTER — Inpatient Hospital Stay
Admission: RE | Admit: 2018-04-14 | Discharge: 2018-04-16 | DRG: 784 | Disposition: A | Discharge status: Home / Self Care | Payer: Medicaid Other | Attending: Obstetrics and Gynecology | Admitting: Obstetrics and Gynecology

## 2018-04-14 ENCOUNTER — Inpatient Hospital Stay
Admission: RE | Admit: 2018-04-14 | Payer: Medicaid Other | Source: Ambulatory Visit | Admitting: Obstetrics and Gynecology

## 2018-04-14 ENCOUNTER — Inpatient Hospital Stay: Payer: Medicaid Other | Admitting: Certified Registered Nurse Anesthetist

## 2018-04-14 DIAGNOSIS — Z302 Encounter for sterilization: Secondary | ICD-10-CM

## 2018-04-14 DIAGNOSIS — O34211 Maternal care for low transverse scar from previous cesarean delivery: Secondary | ICD-10-CM

## 2018-04-14 DIAGNOSIS — O99214 Obesity complicating childbirth: Secondary | ICD-10-CM | POA: Diagnosis present

## 2018-04-14 DIAGNOSIS — O9902 Anemia complicating childbirth: Secondary | ICD-10-CM | POA: Diagnosis present

## 2018-04-14 DIAGNOSIS — E669 Obesity, unspecified: Secondary | ICD-10-CM | POA: Diagnosis present

## 2018-04-14 DIAGNOSIS — K219 Gastro-esophageal reflux disease without esophagitis: Secondary | ICD-10-CM | POA: Diagnosis present

## 2018-04-14 DIAGNOSIS — O9962 Diseases of the digestive system complicating childbirth: Secondary | ICD-10-CM | POA: Diagnosis present

## 2018-04-14 DIAGNOSIS — D509 Iron deficiency anemia, unspecified: Secondary | ICD-10-CM | POA: Diagnosis present

## 2018-04-14 DIAGNOSIS — F1210 Cannabis abuse, uncomplicated: Secondary | ICD-10-CM | POA: Diagnosis present

## 2018-04-14 DIAGNOSIS — O99324 Drug use complicating childbirth: Secondary | ICD-10-CM | POA: Diagnosis present

## 2018-04-14 DIAGNOSIS — Z3A39 39 weeks gestation of pregnancy: Secondary | ICD-10-CM

## 2018-04-14 LAB — URINE DRUG SCREEN, QUALITATIVE (ARMC ONLY)
AMPHETAMINES, UR SCREEN: NOT DETECTED
Barbiturates, Ur Screen: NOT DETECTED
Benzodiazepine, Ur Scrn: POSITIVE — AB
COCAINE METABOLITE, UR ~~LOC~~: NOT DETECTED
Cannabinoid 50 Ng, Ur ~~LOC~~: POSITIVE — AB
MDMA (Ecstasy)Ur Screen: NOT DETECTED
METHADONE SCREEN, URINE: NOT DETECTED
OPIATE, UR SCREEN: NOT DETECTED
PHENCYCLIDINE (PCP) UR S: NOT DETECTED
Tricyclic, Ur Screen: NOT DETECTED

## 2018-04-14 LAB — ABO/RH: ABO/RH(D): A POS

## 2018-04-14 SURGERY — Surgical Case
Anesthesia: Spinal | Site: Abdomen | Laterality: Bilateral | Wound class: Clean Contaminated

## 2018-04-14 MED ORDER — IBUPROFEN 600 MG PO TABS
600.0000 mg | ORAL_TABLET | Freq: Four times a day (QID) | ORAL | Status: DC
  Administered 2018-04-14 – 2018-04-15 (×4): 600 mg via ORAL
  Filled 2018-04-14 (×4): qty 1

## 2018-04-14 MED ORDER — OXYCODONE HCL 5 MG PO TABS
5.0000 mg | ORAL_TABLET | ORAL | Status: DC | PRN
  Administered 2018-04-15: 5 mg via ORAL
  Filled 2018-04-14: qty 1

## 2018-04-14 MED ORDER — SOD CITRATE-CITRIC ACID 500-334 MG/5ML PO SOLN
ORAL | Status: AC
  Administered 2018-04-14: 30 mL
  Filled 2018-04-14: qty 15

## 2018-04-14 MED ORDER — BUPIVACAINE IN DEXTROSE 0.75-8.25 % IT SOLN
INTRATHECAL | Status: DC | PRN
  Administered 2018-04-14: 1.6 mL via INTRATHECAL

## 2018-04-14 MED ORDER — OXYCODONE HCL 5 MG PO TABS
10.0000 mg | ORAL_TABLET | ORAL | Status: DC | PRN
  Administered 2018-04-15 (×3): 10 mg via ORAL
  Filled 2018-04-14 (×4): qty 2

## 2018-04-14 MED ORDER — CEFAZOLIN SODIUM-DEXTROSE 2-4 GM/100ML-% IV SOLN
2.0000 g | Freq: Once | INTRAVENOUS | Status: AC
  Administered 2018-04-14: 2 g via INTRAVENOUS
  Filled 2018-04-14: qty 100

## 2018-04-14 MED ORDER — ONDANSETRON HCL 4 MG/2ML IJ SOLN: 4.0000 mg | Freq: Once | INTRAMUSCULAR | Status: DC

## 2018-04-14 MED ORDER — SODIUM CHLORIDE 0.9% FLUSH: 3.0000 mL | INTRAVENOUS | Status: DC | PRN

## 2018-04-14 MED ORDER — MENTHOL 3 MG MT LOZG
1.0000 | LOZENGE | OROMUCOSAL | Status: DC | PRN
  Filled 2018-04-14: qty 9

## 2018-04-14 MED ORDER — ONDANSETRON HCL 4 MG/2ML IJ SOLN
INTRAMUSCULAR | Status: AC
  Administered 2018-04-14: 4 mg
  Filled 2018-04-14: qty 2

## 2018-04-14 MED ORDER — OXYTOCIN 40 UNITS IN LACTATED RINGERS INFUSION - SIMPLE MED
INTRAVENOUS | Status: DC | PRN
  Administered 2018-04-14: 1000 mL via INTRAVENOUS

## 2018-04-14 MED ORDER — DIPHENHYDRAMINE HCL 25 MG PO CAPS: 25.0000 mg | ORAL_CAPSULE | Freq: Four times a day (QID) | ORAL | Status: DC | PRN

## 2018-04-14 MED ORDER — NALBUPHINE HCL 10 MG/ML IJ SOLN: 5.0000 mg | Freq: Once | INTRAMUSCULAR | Status: DC | PRN

## 2018-04-14 MED ORDER — SIMETHICONE 80 MG PO CHEW
80.0000 mg | CHEWABLE_TABLET | Freq: Four times a day (QID) | ORAL | Status: DC
  Administered 2018-04-14 – 2018-04-16 (×8): 80 mg via ORAL
  Filled 2018-04-14 (×8): qty 1

## 2018-04-14 MED ORDER — LIDOCAINE 5 % EX PTCH
MEDICATED_PATCH | CUTANEOUS | Status: DC | PRN
  Administered 2018-04-14: 1 via TRANSDERMAL

## 2018-04-14 MED ORDER — NALBUPHINE HCL 10 MG/ML IJ SOLN: 5.0000 mg | INTRAMUSCULAR | Status: DC | PRN

## 2018-04-14 MED ORDER — IBUPROFEN 600 MG PO TABS
600.0000 mg | ORAL_TABLET | Freq: Four times a day (QID) | ORAL | Status: DC
  Administered 2018-04-14: 600 mg via ORAL
  Filled 2018-04-14: qty 1

## 2018-04-14 MED ORDER — BENZONATATE 100 MG PO CAPS
100.0000 mg | ORAL_CAPSULE | Freq: Three times a day (TID) | ORAL | Status: DC | PRN
  Administered 2018-04-14 – 2018-04-15 (×3): 100 mg via ORAL
  Filled 2018-04-14 (×4): qty 1

## 2018-04-14 MED ORDER — SODIUM CHLORIDE 0.9 % IV SOLN
INTRAVENOUS | Status: DC | PRN
  Administered 2018-04-14: 50 ug/min via INTRAVENOUS

## 2018-04-14 MED ORDER — PROMETHAZINE HCL 25 MG/ML IJ SOLN: 6.2500 mg | Freq: Four times a day (QID) | INTRAMUSCULAR | Status: DC | PRN

## 2018-04-14 MED ORDER — OXYCODONE-ACETAMINOPHEN 5-325 MG PO TABS
2.0000 | ORAL_TABLET | ORAL | Status: DC | PRN
  Administered 2018-04-14 – 2018-04-16 (×4): 2 via ORAL
  Filled 2018-04-14 (×4): qty 2

## 2018-04-14 MED ORDER — IBUPROFEN 600 MG PO TABS
ORAL_TABLET | ORAL | Status: AC
  Filled 2018-04-14: qty 1

## 2018-04-14 MED ORDER — DIPHENHYDRAMINE HCL 50 MG/ML IJ SOLN: 12.5000 mg | INTRAMUSCULAR | Status: DC | PRN

## 2018-04-14 MED ORDER — OXYCODONE-ACETAMINOPHEN 5-325 MG PO TABS
1.0000 | ORAL_TABLET | ORAL | Status: DC | PRN
  Administered 2018-04-14 (×2): 1 via ORAL
  Filled 2018-04-14: qty 1

## 2018-04-14 MED ORDER — OXYTOCIN 40 UNITS IN LACTATED RINGERS INFUSION - SIMPLE MED
2.5000 [IU]/h | INTRAVENOUS | Status: AC
  Administered 2018-04-14 (×2): 2.5 [IU]/h via INTRAVENOUS
  Filled 2018-04-14: qty 1000

## 2018-04-14 MED ORDER — FENTANYL CITRATE (PF) 100 MCG/2ML IJ SOLN
INTRAMUSCULAR | Status: DC | PRN
  Administered 2018-04-14: 15 ug via INTRATHECAL

## 2018-04-14 MED ORDER — OXYTOCIN 40 UNITS IN LACTATED RINGERS INFUSION - SIMPLE MED
INTRAVENOUS | Status: AC
  Filled 2018-04-14: qty 1000

## 2018-04-14 MED ORDER — LACTATED RINGERS IV SOLN
INTRAVENOUS | Status: DC
  Administered 2018-04-14: 09:00:00 via INTRAVENOUS

## 2018-04-14 MED ORDER — DIPHENHYDRAMINE HCL 25 MG PO CAPS: 25.0000 mg | ORAL_CAPSULE | ORAL | Status: DC | PRN

## 2018-04-14 MED ORDER — MORPHINE SULFATE (PF) 0.5 MG/ML IJ SOLN
INTRAMUSCULAR | Status: DC | PRN
  Administered 2018-04-14: .1 mg via INTRATHECAL

## 2018-04-14 MED ORDER — LIDOCAINE 5 % EX PTCH
MEDICATED_PATCH | CUTANEOUS | Status: AC
  Filled 2018-04-14: qty 1

## 2018-04-14 MED ORDER — SENNOSIDES-DOCUSATE SODIUM 8.6-50 MG PO TABS
2.0000 | ORAL_TABLET | Freq: Every day | ORAL | Status: DC
  Administered 2018-04-15: 2 via ORAL
  Filled 2018-04-14 (×2): qty 2

## 2018-04-14 MED ORDER — ONDANSETRON HCL 4 MG/2ML IJ SOLN: 4.0000 mg | Freq: Three times a day (TID) | INTRAMUSCULAR | Status: DC | PRN

## 2018-04-14 MED ORDER — PRENATAL MULTIVITAMIN CH
1.0000 | ORAL_TABLET | Freq: Every day | ORAL | Status: DC
  Administered 2018-04-14 – 2018-04-15 (×2): 1 via ORAL
  Filled 2018-04-14 (×3): qty 1

## 2018-04-14 MED ORDER — ACETAMINOPHEN 325 MG PO TABS
650.0000 mg | ORAL_TABLET | ORAL | Status: DC | PRN
Start: 2018-04-14 — End: 2018-04-16

## 2018-04-14 MED ORDER — ZOLPIDEM TARTRATE 5 MG PO TABS: 5.0000 mg | ORAL_TABLET | Freq: Every evening | ORAL | Status: DC | PRN

## 2018-04-14 MED ORDER — ACETAMINOPHEN 325 MG PO TABS
650.0000 mg | ORAL_TABLET | Freq: Four times a day (QID) | ORAL | Status: AC
  Administered 2018-04-15 (×3): 650 mg via ORAL
  Filled 2018-04-14 (×5): qty 2

## 2018-04-14 MED ORDER — NALOXONE HCL 0.4 MG/ML IJ SOLN: 0.4000 mg | INTRAMUSCULAR | Status: DC | PRN

## 2018-04-14 SURGICAL SUPPLY — 23 items
ADHESIVE MASTISOL STRL (MISCELLANEOUS) ×2 IMPLANT
BAG COUNTER SPONGE EZ (MISCELLANEOUS) ×2 IMPLANT
CANISTER SUCT 3000ML PPV (MISCELLANEOUS) ×2 IMPLANT
CHLORAPREP W/TINT 26ML (MISCELLANEOUS) ×4 IMPLANT
CLIP FILSHIE TUBAL LIGA STRL (Clip) ×4 IMPLANT
DRSG TELFA 3X8 NADH (GAUZE/BANDAGES/DRESSINGS) ×2 IMPLANT
GAUZE SPONGE 4X4 12PLY STRL (GAUZE/BANDAGES/DRESSINGS) ×2 IMPLANT
GLOVE BIOGEL PI ORTHO PRO 7.5 (GLOVE) ×2
GLOVE PI ORTHO PRO STRL 7.5 (GLOVE) ×2 IMPLANT
GOWN STRL REUS W/ TWL LRG LVL3 (GOWN DISPOSABLE) ×2 IMPLANT
GOWN STRL REUS W/TWL LRG LVL3 (GOWN DISPOSABLE) ×2
KIT TURNOVER KIT A (KITS) ×2 IMPLANT
NS IRRIG 1000ML POUR BTL (IV SOLUTION) ×2 IMPLANT
PACK C SECTION AR (MISCELLANEOUS) ×2 IMPLANT
PAD OB MATERNITY 4.3X12.25 (PERSONAL CARE ITEMS) ×2 IMPLANT
PAD PREP 24X41 OB/GYN DISP (PERSONAL CARE ITEMS) ×2 IMPLANT
RETRACTOR WND ALEXIS-O 25 LRG (MISCELLANEOUS) ×1 IMPLANT
RTRCTR WOUND ALEXIS O 25CM LRG (MISCELLANEOUS) ×2
SPONGE LAP 18X18 RF (DISPOSABLE) ×2 IMPLANT
SUT VIC AB 0 CTX 36 (SUTURE) ×2
SUT VIC AB 0 CTX36XBRD ANBCTRL (SUTURE) ×2 IMPLANT
SUT VIC AB 1 CT1 36 (SUTURE) ×4 IMPLANT
SUT VICRYL+ 3-0 36IN CT-1 (SUTURE) ×4 IMPLANT

## 2018-04-14 NOTE — Op Note (Signed)
      OP NOTE  Date: 04/14/2018   11:38 AM Name Dominique Dyer MR# [id number]  Preoperative Diagnosis: 1. Intrauterine pregnancy at [redacted]w[redacted]d 2. Desires Permanent Sterilization 3. Desires Repeat CD Active Problems:   * No active hospital problems. *  Postoperative Diagnosis: 1. Intrauterine pregnancy at [redacted]w[redacted]d, delivered 2. Desires Permanent Sterilization - Done 3. Viable infant 4. Remainder same as pre-op  Procedure: 1. Repeat Low-Transverse Cesarean Section 2. Bilateral Tubal Occlusion  Surgeon: Finis Bud, MD  Assistant:    Anesthesia: Spinal   EBL: 550 ml    Findings: 1) female infant, Apgar scores of 8    at 1 minute and 9    at 5 minutes and a birthweight of 131.22  ounces.    2) Normal uterus, tubes and ovaries.   Procedure:   The patient was prepped and draped in the supine position and placed under spinal anesthesia.  A transverse incision was made across the abdomen in a Pfannenstiel manner. If indicated the old scar was systematically removed with sharp dissection.  We carried the dissection down to the level of the fascia.  The fascia was incised in a curvilinear manner.  The fascia was then elevated from the rectus muscles with blunt and sharp dissection.  The rectus muscles were separated laterally exposing the peritoneum.  The peritoneum was carefully entered with care being taken to avoid bowel and bladder.  A self-retaining retractor was placed.  The visceral peritoneum was incised in a curvilinear fashion across the lower uterine segment creating a bladder flap. A transverse incision was made across the lower uterine segment and extended laterally and superiorly using the bandage scissors.  Artificial rupture membranes was performed and Other light meconium? fluid was noted.  The infant was delivered from the cephalic position.  A nuchal cord was not present. The cord was doubly clamped and cut. Cord blood was obtained if appropriate.  The infant was handed to  the pediatric personnel  who then placed the infant under heat lamps where it was cleaned dried and re-suctioned. The placenta was delivered. The hysterotomy incision was then identified on ring forceps.  The uterine cavity was cleaned with a moist lap sponge.  The hysterotomy incision was closed with a running interlocking suture of Vicryl.  Hemostasis was excellent.  Pitocin was run in the IV and the uterus was found to be firm. The fallopian tubes were identified  and followed out to their fine fimbriated ends and then back to the mid-portion of the tube which was elevated on a babcock clamp.  Both tubes were completely occluded using Filshie clips in a perpendicular manner.  Hemostasis was noted. The posterior cul-de-sac and gutters were cleaned and inspected.  Hemostasis was noted.  The fascia was then closed with a running suture of #1 Vicryl.  Hemostasis of the subcutaneous tissues was obtained using the Bovie.  The subcutaneous tissues were closed with a running suture of 000 Vicryl.  A subcuticular suture was placed.  Steri-Strips were applied in the usual manner.  A pressure dressing was placed.  The patient went to the recovery room in stable condition.   Finis Bud, M.D. 04/14/2018 11:38 AM

## 2018-04-14 NOTE — Anesthesia Procedure Notes (Signed)
Date/Time: 04/14/2018 10:11 AM Performed by: Johnna Acosta, CRNA Pre-anesthesia Checklist: Patient identified, Emergency Drugs available, Suction available, Patient being monitored and Timeout performed Patient Re-evaluated:Patient Re-evaluated prior to induction Oxygen Delivery Method: Nasal cannula Preoxygenation: Pre-oxygenation with 100% oxygen

## 2018-04-14 NOTE — H&P (Signed)
History and Physical   HPI  Dominique Dyer is a 37 y.o. G2P1 at 105w3d Estimated Date of Delivery: 04/18/18 who is being admitted for  C-section with tubal sterilization    OB History  OB History  Gravida Para Term Preterm AB Living  2 1 0 0 0 0  SAB TAB Ectopic Multiple Live Births  0 0 0 0 0    # Outcome Date GA Lbr Len/2nd Weight Sex Delivery Anes PTL Lv  2 Current           1 Para 2016    M CS-Unspec       PROBLEM LIST  Pregnancy complications or risks: Patient Active Problem List   Diagnosis Date Noted  . Carpal tunnel syndrome during pregnancy 03/28/2018  . Sinus tachycardia 03/08/2018  . Elevated blood pressure affecting pregnancy in third trimester, antepartum 03/08/2018  . Atypical chest pain 03/08/2018  . Shortness of breath 03/08/2018  . Obesity in pregnancy 03/01/2018  . Leucocytosis 02/08/2018  . B12 deficiency 02/01/2018  . Low vitamin B12 level 12/28/2017  . Goals of care, counseling/discussion 12/28/2017  . Iron deficiency anemia 11/16/2017  . History of anemia 11/09/2017  . Anemia of pregnancy in third trimester 11/09/2017  . H/O cesarean section complicating pregnancy 43/15/4008  . Marijuana abuse 11/09/2017  . History of pregnancy induced hypertension 11/09/2017  . Advanced maternal age in multigravida 11/09/2017  . Atypical squamous cell changes of undetermined significance (ASCUS) on cervical cytology with positive high risk human papilloma virus (HPV) 11/09/2017     Prenatal labs and studies: ABO, Rh: --/--/A POS (07/25 1104) Antibody: NEG (07/25 1104) Rubella: 2.71 (01/23 1039) RPR: Non Reactive (05/15 0946)  HBsAg: Negative (01/23 1039)  HIV: Non Reactive (01/23 1039)  QPY:PPJKDTOI (06/26 7124)   Past Medical History:  Diagnosis Date  . Anemia 2019   iron deficiency and b12 deficiency  . Anginal pain ([place]) 2019   with pregnancy. ekg fine. cardiology reviewed  . Anxiety   . Bipolar disorder ([place])   . Dyspnea    with  pregnancy  . Dysrhythmia 2019   tachycardia with pregnancy and panic attacks  . GERD (gastroesophageal reflux disease)   . Hypertension 2019   with pregnancy  . Migraines    has not had as many recently.  at most, once a month  . Panic attacks   . Pica 2019   ice     Past Surgical History:  Procedure Laterality Date  . CESAREAN SECTION    . COLPOSCOPY  2006     Medications    Current Discharge Medication List    CONTINUE these medications which have NOT CHANGED   Details  ALPRAZolam (XANAX) 0.5 MG tablet Take 0.5 mg by mouth 3 (three) times daily.    aspirin EC 81 MG tablet Take 1 tablet (81 mg total) by mouth daily. Take after 12 weeks for prevention of preeclampssia later in pregnancy Qty: 300 tablet, Refills: 2    diphenhydrAMINE-PE-APAP 25-5-325 MG TABS Take 2 tablets by mouth daily as needed (for congestion).    flintstones complete (FLINTSTONES) 60 MG chewable tablet Chew 1 tablet by mouth daily.    omeprazole (PRILOSEC) 20 MG capsule Take 20 mg by mouth daily.     ondansetron (ZOFRAN ODT) 4 MG disintegrating tablet Take 1 tablet (4 mg total) by mouth every 6 (six) hours as needed for nausea. Qty: 20 tablet, Refills: 1    prazosin (MINIPRESS) 1 MG capsule Take  1 mg by mouth at bedtime. As needed to help stop dreams Refills: 0         Allergies  Aspirin; Naproxen; Sumatriptan succinate; and Lactose intolerance (gi)  Review of Systems  Pertinent items are noted in HPI.  Physical Exam  BP 115/82   Pulse 80   Temp 98.1 F (36.7 C) (Oral)   Resp 16   Ht 5\' 4"  (1.626 m)   Wt 250 lb (113.4 kg)   LMP 07/12/2017   BMI 42.91 kg/m   Lungs:  CTA B Cardio: RRR without M/R/G Abd: Soft, gravid, NT Presentation: cephalic EXT: No C/C/ 1+ Edema DTRs: 2+ B CERVIX:     See Prenatal records for more detailed PE.     FHR:  Variability: Good {> 6 bpm)  Toco: Uterine Contractions: None    Test Results  Results for orders placed or performed  during the hospital encounter of 04/14/18 (from the past 24 hour(s))  Urine Drug Screen, Qualitative ([facility] only)     Status: Abnormal   Collection Time: 04/14/18  8:17 AM  Result Value Ref Range   Tricyclic, Ur Screen NONE DETECTED NONE DETECTED   Amphetamines, Ur Screen NONE DETECTED NONE DETECTED   MDMA (Ecstasy)Ur Screen NONE DETECTED NONE DETECTED   Cocaine Metabolite,Ur [place] NONE DETECTED NONE DETECTED   Opiate, Ur Screen NONE DETECTED NONE DETECTED   Phencyclidine (PCP) Ur S NONE DETECTED NONE DETECTED   Cannabinoid 50 Ng, Ur [place] POSITIVE (A) NONE DETECTED   Barbiturates, Ur Screen NONE DETECTED NONE DETECTED   Benzodiazepine, Ur Scrn POSITIVE (A) NONE DETECTED   Methadone Scn, Ur NONE DETECTED NONE DETECTED     Assessment   G2P1 at [redacted]w[redacted]d Estimated Date of Delivery: 04/18/18  The fetus is reassuring.  Pt desires repeat CD with Tubal  Patient Active Problem List   Diagnosis Date Noted  . Carpal tunnel syndrome during pregnancy 03/28/2018  . Sinus tachycardia 03/08/2018  . Elevated blood pressure affecting pregnancy in third trimester, antepartum 03/08/2018  . Atypical chest pain 03/08/2018  . Shortness of breath 03/08/2018  . Obesity in pregnancy 03/01/2018  . Leucocytosis 02/08/2018  . B12 deficiency 02/01/2018  . Low vitamin B12 level 12/28/2017  . Goals of care, counseling/discussion 12/28/2017  . Iron deficiency anemia 11/16/2017  . History of anemia 11/09/2017  . Anemia of pregnancy in third trimester 11/09/2017  . H/O cesarean section complicating pregnancy 89/21/1941  . Marijuana abuse 11/09/2017  . History of pregnancy induced hypertension 11/09/2017  . Advanced maternal age in multigravida 11/09/2017  . Atypical squamous cell changes of undetermined significance (ASCUS) on cervical cytology with positive high risk human papilloma virus (HPV) 11/09/2017    Plan  1. Admit to L&D for repeat CD and tubal 2. EFM: -- Category 1   Finis Bud,  M.D. 04/14/2018 10:13 AM

## 2018-04-14 NOTE — Anesthesia Post-op Follow-up Note (Signed)
Anesthesia QCDR form completed.        

## 2018-04-14 NOTE — Transfer of Care (Signed)
Immediate Anesthesia Transfer of Care Note  Patient: Dominique Dyer  Procedure(s) Performed: REPEAT CESAREAN SECTION WITH BTL (Bilateral Abdomen)  Patient Location: PACU  Anesthesia Type:Spinal  Level of Consciousness: awake, alert  and oriented  Airway & Oxygen Therapy: Patient Spontanous Breathing  Post-op Assessment: Report given to RN and Post -op Vital signs reviewed and stable  Post vital signs: Reviewed and stable  Last Vitals:  Vitals Value Taken Time  BP 113/82 04/14/2018 11:38 AM  Temp    Pulse 68 04/14/2018 11:38 AM  Resp 10 04/14/2018 11:38 AM  SpO2 99 % 04/14/2018 11:38 AM    Last Pain:  Vitals:   04/14/18 0858  TempSrc:   PainSc: 0-No pain         Complications: No apparent anesthesia complications

## 2018-04-14 NOTE — Anesthesia Procedure Notes (Signed)
Spinal  Patient location during procedure: OR Start time: 04/14/2018 10:13 AM End time: 04/14/2018 10:17 AM Staffing Anesthesiologist: Emmie Niemann, MD Resident/CRNA: Johnna Acosta, CRNA Performed: resident/CRNA  Preanesthetic Checklist Completed: patient identified, site marked, surgical consent, pre-op evaluation, timeout performed, IV checked, risks and benefits discussed and monitors and equipment checked Spinal Block Patient position: sitting Prep: ChloraPrep Patient monitoring: heart rate, continuous pulse ox, blood pressure and cardiac monitor Approach: midline Location: L4-5 Injection technique: single-shot Needle Needle type: Introducer and Pencil-Tip  Needle gauge: 24 G Needle length: 9 cm Additional Notes Negative paresthesia. Negative blood return. Positive free-flowing CSF. Expiration date of kit checked and confirmed. Patient tolerated procedure well, without complications.

## 2018-04-14 NOTE — Interval H&P Note (Signed)
History and Physical Interval Note:  04/14/2018 10:16 AM  Dominique Dyer  has presented today for surgery, with the diagnosis of PRIOR C SECTION  The various methods of treatment have been discussed with the patient and family. After consideration of risks, benefits and other options for treatment, the patient has consented to  Procedure(s): REPEAT CESAREAN SECTION WITH BTL (Bilateral) as a surgical intervention .  The patient's history has been reviewed, patient examined, no change in status, stable for surgery.  I have reviewed the patient's chart and labs.  Questions were answered to the patient's satisfaction.     Jeannie Fend

## 2018-04-14 NOTE — Anesthesia Preprocedure Evaluation (Signed)
Anesthesia Evaluation  Patient identified by MRN, date of birth, ID band Patient awake    Reviewed: Allergy & Precautions, NPO status , Patient's Chart, lab work & pertinent test results  History of Anesthesia Complications Negative for: history of anesthetic complications  Airway Mallampati: III  TM Distance: >3 FB Neck ROM: Full    Dental no notable dental hx.    Pulmonary neg sleep apnea, neg COPD, Current Smoker,    breath sounds clear to auscultation- rhonchi (-) wheezing      Cardiovascular hypertension, (-) CAD, (-) Past MI, (-) Cardiac Stents and (-) CABG  Rhythm:Regular Rate:Normal - Systolic murmurs and - Diastolic murmurs    Neuro/Psych  Headaches, PSYCHIATRIC DISORDERS Anxiety Bipolar Disorder    GI/Hepatic Neg liver ROS, GERD  ,  Endo/Other  negative endocrine ROSneg diabetes  Renal/GU negative Renal ROS     Musculoskeletal negative musculoskeletal ROS (+)   Abdominal (+) + obese, Gravid abdomen  Peds  Hematology  (+) anemia ,   Anesthesia Other Findings Past Medical History: 2019: Anemia     Comment:  iron deficiency and b12 deficiency 2019: Anginal pain ([place])     Comment:  with pregnancy. ekg fine. cardiology reviewed No date: Anxiety No date: Bipolar disorder ([place]) No date: Dyspnea     Comment:  with pregnancy 2019: Dysrhythmia     Comment:  tachycardia with pregnancy and panic attacks No date: GERD (gastroesophageal reflux disease) 2019: Hypertension     Comment:  with pregnancy No date: Migraines     Comment:  has not had as many recently.  at most, once a month No date: Panic attacks 2019: Pica     Comment:  ice   Reproductive/Obstetrics (+) Pregnancy                             Lab Results  Component Value Date   WBC 14.7 (H) 04/13/2018   HGB 11.5 (L) 04/13/2018   HCT 35.6 04/13/2018   MCV 69.9 (L) 04/13/2018   PLT 286 04/13/2018    Anesthesia  Physical Anesthesia Plan  ASA: II  Anesthesia Plan: Spinal   Post-op Pain Management:    Induction:   PONV Risk Score and Plan: 1 and Ondansetron  Airway Management Planned: Natural Airway  Additional Equipment:   Intra-op Plan:   Post-operative Plan:   Informed Consent: I have reviewed the patients History and Physical, chart, labs and discussed the procedure including the risks, benefits and alternatives for the proposed anesthesia with the patient or authorized representative who has indicated his/her understanding and acceptance.   Dental advisory given  Plan Discussed with: CRNA and Anesthesiologist  Anesthesia Plan Comments:         Anesthesia Quick Evaluation

## 2018-04-15 MED ORDER — IBUPROFEN 600 MG PO TABS
600.0000 mg | ORAL_TABLET | Freq: Four times a day (QID) | ORAL | Status: DC
  Administered 2018-04-15 – 2018-04-16 (×4): 600 mg via ORAL
  Filled 2018-04-15 (×4): qty 1

## 2018-04-15 NOTE — Anesthesia Postprocedure Evaluation (Signed)
Anesthesia Post Note  Patient: Dominique Dyer  Procedure(s) Performed: REPEAT CESAREAN SECTION WITH BTL (Bilateral Abdomen)  Patient location during evaluation: Mother Baby Anesthesia Type: Spinal Level of consciousness: oriented and awake and alert Pain management: pain level controlled Vital Signs Assessment: post-procedure vital signs reviewed and stable Respiratory status: spontaneous breathing Cardiovascular status: blood pressure returned to baseline and stable Postop Assessment: no headache, no backache, no apparent nausea or vomiting, patient able to bend at knees and able to ambulate Anesthetic complications: no     Last Vitals:  Vitals:   04/15/18 0427 04/15/18 0853  BP: 134/80 135/86  Pulse: 83 84  Resp: 20 20  Temp: 36.7 C 36.6 C  SpO2: 98% 98%    Last Pain:  Vitals:   04/15/18 0853  TempSrc: Oral  PainSc:                  Precious Haws [place]

## 2018-04-15 NOTE — Addendum Note (Signed)
Addendum  created 04/15/18 0913 by Moni Rothrock, Precious Haws, MD   Sign clinical note

## 2018-04-15 NOTE — Anesthesia Post-op Follow-up Note (Signed)
  Anesthesia Pain Follow-up Note  Patient: Dominique Dyer  Day #: 1  Date of Follow-up: 04/15/2018 Time: 9:13 AM  Last Vitals:  Vitals:   04/15/18 0427 04/15/18 0853  BP: 134/80 135/86  Pulse: 83 84  Resp: 20 20  Temp: 36.7 C 36.6 C  SpO2: 98% 98%    Level of Consciousness: alert  Pain: mild   Side Effects:None  Catheter Site Exam:clean, dry     Plan: D/C from anesthesia care at surgeon's request  [place]

## 2018-04-15 NOTE — Progress Notes (Signed)
Patient ID: Dominique Dyer, female   DOB: 16-Nov-1980, 37 y.o.   MRN: [id number]    Progress Note - Cesarean Delivery  Dominique Dyer is a 37 y.o. G2P1001 now PP day 1 s/p C-Section, Low Transverse .   Subjective:  Patient reports no problems with eating, bowel movements, voiding, or their wound  Pain controlled.  Cough improved.  Objective:  Vital signs in last 24 hours: Temp:  [97.9 F (36.6 C)-98.5 F (36.9 C)] 97.9 F (36.6 C) (07/27 0853) Pulse Rate:  [68-94] 84 (07/27 0853) Resp:  [10-21] 20 (07/27 0853) BP: (113-135)/(54-97) 135/86 (07/27 0853) SpO2:  [98 %-99 %] 98 % (07/27 0853)  Physical Exam:  General: alert, cooperative and no distress Lochia: appropriate Uterine Fundus: firm Incision: dressing intact DVT Evaluation: No evidence of DVT seen on physical exam.    Data Review Recent Labs    04/13/18 1104  HGB 11.5*  HCT 35.6    Assessment:  Active Problems:   Delivery by cesarean section using transverse incision of lower segment of uterus   Status post Cesarean section. Doing well postoperatively.     Plan:       Continue current care.    Finis Bud, M.D. 04/15/2018 10:48 AM

## 2018-04-15 NOTE — Clinical Social Work Maternal (Signed)
  CLINICAL SOCIAL WORK MATERNAL/CHILD NOTE  Patient Details  Name: Dominique Dyer MRN: [id number] Date of Birth: 1981/08/25  Date:  04/15/2018  Clinical Social Worker Initiating Note:  Annamaria Boots LCSWA Date/Time: Initiated:  04/15/18/0821     Child's Name:  Dominique Dyer    Biological Parents:  Mother   Need for Interpreter:  None   Reason for Referral:  Current Substance Use/Substance Use During Pregnancy    Address:  [address] [place] [address]    Phone number:  [phone] (home)     Additional phone number:   Household Members/Support Persons (HM/SP):       HM/SP Name Relationship DOB or Age  HM/SP -1        HM/SP -2        HM/SP -3        HM/SP -4        HM/SP -5        HM/SP -6        HM/SP -7        HM/SP -8          Natural Supports (not living in the home):  Extended Family, Friends   Chiropodist: None   Employment: Unemployed   Type of Work:     Education:  Programmer, systems   Homebound arranged:    Museum/gallery curator Resources:  [employer]   Other Resources:  [employer], Physicist, medical    Cultural/Religious Considerations Which May Impact Care:    Strengths:  Ability to meet basic needs , Home prepared for child    Psychotropic Medications:         Pediatrician:       Pediatrician List:   [place]      Pediatrician Fax Number:    Risk Factors/Current Problems:  Substance Use    Cognitive State:  Alert , Goal Oriented , Insightful    Mood/Affect:  Calm , Relaxed    CSW Assessment: CSW consulted for substance use during pregnancy. CSW met with patient. CSW introduced self and explained role. Patient states that she currently lives with her parents and her 37 year old son. Patient states that baby's father is not involved. Patient reports that she has all necessary items for baby at home and has a good  support system in place. Patient plans to get [facility] and Medicaid for baby once she gets home. Patient is goal oriented and has good support. CSW explained that patient and baby both tested positive for marijuana. Patient states that she did smoke marijuana during pregnancy for nausea. CSW explained that a CPS (child protective services) report would be made since baby was positive for drug. CSW explained what the CPS report would involve. Patient states understanding. She states that she has never have a CPS report made before. CSW also educated patient on substance use and how it affects her and the baby. Patient states understanding. CSW made CPS report for drug use. CPS will follow up with mother when able. CSW signing off. Please re consult if further needs arise.   CSW Plan/Description:  Child Protective Service Report     Annamaria Boots, Latanya Presser 04/15/2018, 2:23 PM

## 2018-04-16 MED ORDER — OXYCODONE HCL 5 MG PO TABS: 5.0000 mg | ORAL_TABLET | ORAL | 0 refills | Status: DC | PRN

## 2018-04-16 NOTE — Discharge Instructions (Signed)
Call your doctor for increased pain or vaginal bleeding, temperature above 100.4, depression, or concerns.  No strenuous activity or heavy lifting for 6 weeks.  No intercourse, tampons, or douching for 6 weeks.  No tub baths- showers only.  No driving for 2 weeks or while taking pain medication.  Keep incision clean and dry.  Call your doctor for incision concerns including redness, swelling, bleeding or drainage, or if begins to come apart.  Please follow instructions in incision hygiene kit. Continue prenatal vitamin and iron.

## 2018-04-16 NOTE — Discharge Summary (Signed)
Physician Obstetric Discharge Summary  Patient ID: Dominique Dyer MRN: [id number] DOB/AGE: Nov 02, 1980 37 y.o.   Date of Admission: 04/14/2018  Date of Discharge: 04/16/2018  Admitting Diagnosis: Scheduled cesarean section at [redacted]w[redacted]d   Mode of Delivery: repeat cesarean section and tubal ligation was performed            Discharge Diagnosis: No other diagnosis   Intrapartum Procedures: tubal ligation   Post partum procedures:   Complications: none                        Discharge Day SOAP Note:  Subjective:  The patient has no complaints.  She is ambulating well. She is taking PO well. Pain is well controlled with current medications. Patient is urinating without difficulty.   She is passing flatus.    Objective  Vital signs in last 24 hours: BP (!) 145/80 (BP Location: Right Arm)   Pulse 78   Temp 97.7 F (36.5 C) (Oral)   Resp 20   Ht 5\' 4"  (1.626 m)   Wt 250 lb (113.4 kg)   LMP 07/12/2017   SpO2 100%   Breastfeeding? Unknown   BMI 42.91 kg/m   Physical Exam: Gen: NAD Abdomen:  clean, dry, no drainage, healing Fundus Fundal Tone: Firm  Lochia Amount: Small     Data Review Labs: CBC Latest Ref Rng & Units 04/13/2018 03/29/2018 03/08/2018  WBC 3.6 - 11.0 K/uL 14.7(H) 12.6(H) 13.6(H)  Hemoglobin 12.0 - 16.0 g/dL 11.5(L) 10.9(L) 11.0(L)  Hematocrit 35.0 - 47.0 % 35.6 34.8(L) 34.6(L)  Platelets 150 - 440 K/uL 286 344 355   A POS Performed at [facility], [address] [place], [place] [address]   Assessment:  Active Problems:   Delivery by cesarean section using transverse incision of lower segment of uterus   Doing well.  Normal progress as expected.     Plan:  Discharge to home  Modified rest as directed - may slowly resume normal activities with restrictions  as discussed.  Medications as written.  See below for additional.       Discharge Instructions: Per After Visit Summary. Activity: Advance as tolerated. Pelvic rest  for 6 weeks.  Also refer to After Visit Summary.  Wound care discussed. Diet: Regular Medications: Allergies as of 04/16/2018      Reactions   Aspirin Nausea Only, Other (See Comments)   Makes stomach bleed and vomits blood   Naproxen Nausea And Vomiting, Nausea Only, Other (See Comments)   Makes stomach bleed. Vomits blood   Sumatriptan Succinate Other (See Comments)   Tingling over entire body. Painful tingling in jaw/mouth   Lactose Intolerance (gi) Other (See Comments)   Gi upset Can not take whole milk but ice cream and yogurt okay      Medication List    STOP taking these medications   ALPRAZolam 0.5 MG tablet Commonly known as:  XANAX   ondansetron 4 MG disintegrating tablet Commonly known as:  ZOFRAN ODT   prazosin 1 MG capsule Commonly known as:  MINIPRESS     TAKE these medications   aspirin EC 81 MG tablet Take 1 tablet (81 mg total) by mouth daily. Take after 12 weeks for prevention of preeclampssia later in pregnancy   diphenhydrAMINE-PE-APAP 25-5-325 MG Tabs Take 2 tablets by mouth daily as needed (for congestion).   flintstones complete 60 MG chewable tablet Chew 1 tablet by mouth daily.   omeprazole 20 MG capsule  Commonly known as:  PRILOSEC Take 20 mg by mouth daily.   oxyCODONE 5 MG immediate release tablet Commonly known as:  Oxy IR/ROXICODONE Take 1 tablet (5 mg total) by mouth every 4 (four) hours as needed for moderate pain.      Outpatient follow up:  Follow-up Information    [name], MD Follow up in 1 week(s).   Specialty:  Obstetrics and Gynecology Contact information: [address] [place] [address] [phone]          Postpartum contraception: Had Tubal ligation at CD  Discharged Condition: good  Discharged to: home  Newborn Data: Disposition:home with mother  Apgars: APGAR (1 MIN): 8   APGAR (5 MINS): 9   APGAR (10 MINS):    Baby Feeding: Bottle  Finis Bud,  M.D. 04/16/2018 10:59 AM

## 2018-04-16 NOTE — Progress Notes (Signed)
Discharge instructions provided.  Pt verbalizes understanding of all instructions and follow-up care.  Incision hygiene kit given.  Pt discharged to home with infant at 1350 on 03/21/18 via wheelchair by volunteer. Susan Paisley Hedrick, RN 04/16/2018 2:51 PM  

## 2018-04-17 ENCOUNTER — Encounter: Payer: Self-pay | Admitting: Obstetrics and Gynecology

## 2018-04-18 ENCOUNTER — Inpatient Hospital Stay (HOSPITAL_COMMUNITY): Admission: RE | Admit: 2018-04-18 | Payer: Self-pay | Source: Ambulatory Visit

## 2018-04-18 ENCOUNTER — Encounter: Payer: Self-pay | Admitting: Obstetrics and Gynecology

## 2018-04-20 ENCOUNTER — Encounter: Payer: Self-pay | Admitting: Obstetrics and Gynecology

## 2018-04-20 ENCOUNTER — Ambulatory Visit (INDEPENDENT_AMBULATORY_CARE_PROVIDER_SITE_OTHER): Payer: Medicaid Other | Admitting: Obstetrics and Gynecology

## 2018-04-20 VITALS — BP 129/86 | HR 91 | Ht 64.0 in | Wt 220.0 lb

## 2018-04-20 DIAGNOSIS — Z9889 Other specified postprocedural states: Secondary | ICD-10-CM

## 2018-04-20 NOTE — Progress Notes (Signed)
HPI:      [name] is a 37 y.o. G2P1001 who LMP was No LMP recorded.  Subjective:   She presents today 1 week from cesarean delivery and tubal ligation.  She is bottlefeeding.  She has no complaints.  She reports that her bleeding is almost completely gone.  She has no abdominal pain.    Hx: The following portions of the patient's history were reviewed and updated as appropriate:             She  has a past medical history of Anemia (2019), Anginal pain ([place]) (2019), Anxiety, Bipolar disorder ([place]), Dyspnea, Dysrhythmia (2019), GERD (gastroesophageal reflux disease), Hypertension (2019), Migraines, Panic attacks, and Pica (2019). She does not have any pertinent problems on file. She  has a past surgical history that includes Cesarean section; Colposcopy (2006); and Cesarean section (Bilateral, 04/14/2018). Her family history includes Cancer in her maternal grandfather and paternal uncle; Heart attack in her paternal grandfather; Heart attack (age of onset: 63) in her mother; Seizures in her father. She  reports that she has been smoking cigarettes.  She has never used smokeless tobacco. She reports that she has current or past drug history. Drug: Marijuana. She reports that she does not drink alcohol. She has a current medication list which includes the following prescription(s): aspirin ec, diphenhydramine-pe-apap, flintstones complete, omeprazole, and oxycodone. She is allergic to aspirin; naproxen; sumatriptan succinate; and lactose intolerance (gi).       Review of Systems:  Review of Systems  Constitutional: Denied constitutional symptoms, night sweats, recent illness, fatigue, fever, insomnia and weight loss.  Eyes: Denied eye symptoms, eye pain, photophobia, vision change and visual disturbance.  Ears/Nose/Throat/Neck: Denied ear, nose, throat or neck symptoms, hearing loss, nasal discharge, sinus congestion and sore throat.  Cardiovascular: Denied cardiovascular symptoms,  arrhythmia, chest pain/pressure, edema, exercise intolerance, orthopnea and palpitations.  Respiratory: Denied pulmonary symptoms, asthma, pleuritic pain, productive sputum, cough, dyspnea and wheezing.  Gastrointestinal: Denied, gastro-esophageal reflux, melena, nausea and vomiting.  Genitourinary: Denied genitourinary symptoms including symptomatic vaginal discharge, pelvic relaxation issues, and urinary complaints.  Musculoskeletal: Denied musculoskeletal symptoms, stiffness, swelling, muscle weakness and myalgia.  Dermatologic: Denied dermatology symptoms, rash and scar.  Neurologic: Denied neurology symptoms, dizziness, headache, neck pain and syncope.  Psychiatric: Denied psychiatric symptoms, anxiety and depression.  Endocrine: Denied endocrine symptoms including hot flashes and night sweats.   Meds:   Current Outpatient Medications on File Prior to Visit  Medication Sig Dispense Refill  . aspirin EC 81 MG tablet Take 1 tablet (81 mg total) by mouth daily. Take after 12 weeks for prevention of preeclampssia later in pregnancy 300 tablet 2  . diphenhydrAMINE-PE-APAP 25-5-325 MG TABS Take 2 tablets by mouth daily as needed (for congestion).    . flintstones complete (FLINTSTONES) 60 MG chewable tablet Chew 1 tablet by mouth daily.    Marland Kitchen omeprazole (PRILOSEC) 20 MG capsule Take 20 mg by mouth daily.     Marland Kitchen oxyCODONE (OXY IR/ROXICODONE) 5 MG immediate release tablet Take 1 tablet (5 mg total) by mouth every 4 (four) hours as needed for moderate pain. 15 tablet 0   No current facility-administered medications on file prior to visit.     Objective:     Vitals:   04/20/18 1029  BP: 129/86  Pulse: 91               Abdomen: Soft.  Non-tender.  No masses.  No HSM.  Incision/s: Intact.  Healing well.  No erythema.  No drainage.      Assessment:    G2P1001 Patient Active Problem List   Diagnosis Date Noted  . Delivery by cesarean section using transverse incision of lower segment  of uterus 04/14/2018  . Carpal tunnel syndrome during pregnancy 03/28/2018  . Sinus tachycardia 03/08/2018  . Elevated blood pressure affecting pregnancy in third trimester, antepartum 03/08/2018  . Atypical chest pain 03/08/2018  . Shortness of breath 03/08/2018  . Obesity in pregnancy 03/01/2018  . Leucocytosis 02/08/2018  . B12 deficiency 02/01/2018  . Low vitamin B12 level 12/28/2017  . Goals of care, counseling/discussion 12/28/2017  . Iron deficiency anemia 11/16/2017  . History of anemia 11/09/2017  . Anemia of pregnancy in third trimester 11/09/2017  . H/O cesarean section complicating pregnancy 78/58/8502  . Marijuana abuse 11/09/2017  . History of pregnancy induced hypertension 11/09/2017  . Advanced maternal age in multigravida 11/09/2017  . Atypical squamous cell changes of undetermined significance (ASCUS) on cervical cytology with positive high risk human papilloma virus (HPV) 11/09/2017     1. Post-operative state     Patient with an excellent recovery so far postop.   Plan:            1.  Wound care discussed.  May resume normal activities with the exception of intercourse and heavy lifting. Orders No orders of the defined types were placed in this encounter.   No orders of the defined types were placed in this encounter.     F/U  Return in about 1 month (around 05/18/2018).  Finis Bud, M.D. 04/20/2018 10:52 AM

## 2018-04-20 NOTE — Progress Notes (Signed)
Pt states no concerns, however has developed warts on her hands during pregnancy and would like to address that with you.

## 2018-04-26 ENCOUNTER — Other Ambulatory Visit: Payer: Self-pay | Admitting: Hematology and Oncology

## 2018-04-26 ENCOUNTER — Inpatient Hospital Stay: Payer: Medicaid Other

## 2018-04-26 ENCOUNTER — Inpatient Hospital Stay: Payer: Medicaid Other | Admitting: Hematology and Oncology

## 2018-04-26 DIAGNOSIS — E538 Deficiency of other specified B group vitamins: Secondary | ICD-10-CM

## 2018-04-26 NOTE — Progress Notes (Deleted)
[facility] day: 04/26/2018     Chief Complaint: Dominique Dyer is a 37 y.o. female with iron deficiency anemia and B12 deficiency s/p C-section who is seen for assessment.   HPI:  The patient was last seen in the hematology clinic on 03/29/2018.  At that time, she was 81 pregnant.  She had chronic fatigue. She was being followed by cardiology for tachycardia.  Heart rate was in the upper limits of normal in clinic.  She denied chest pain, however she continued to be exertionally dyspneic. She had bilateral carpel tunnel symptoms (R>L); "hands are sometimes numb". She denied any urinary symptoms to suggest persistent infection. (+) proteinuria being followed closely by OB/GYN due to her PMH significant for PIH.  Exam was stable.  WBC was 12,600 ([place] 9800).  Hemoglobin 10.9, hematocrit 34.8, MCV 70.2, and platelets 344,000.  Discussions were held regarding additional IV iron following C section.  She received B12.  She underwent scheduled C-section on 04/14/2018.  Labs on 04/13/2018 revealed a hematocrit of 35.6, hemoglobin 11.5, MCV 69.9,  Creatinine was 0.73.  During the interim,   Past Medical History:  Diagnosis Date  . Anemia 2019   iron deficiency and b12 deficiency  . Anginal pain ([place]) 2019   with pregnancy. ekg fine. cardiology reviewed  . Anxiety   . Bipolar disorder ([place])   . Dyspnea    with pregnancy  . Dysrhythmia 2019   tachycardia with pregnancy and panic attacks  . GERD (gastroesophageal reflux disease)   . Hypertension 2019   with pregnancy  . Migraines    has not had as many recently.  at most, once a month  . Panic attacks   . Pica 2019   ice    Past Surgical History:  Procedure Laterality Date  . CESAREAN SECTION    . CESAREAN SECTION Bilateral 04/14/2018   Procedure: REPEAT CESAREAN SECTION WITH BTL;  Surgeon: [name], MD;  Location: [facility] ORS;  Service: Obstetrics;  Laterality: Bilateral;  Birth:  10:45 Sex: Female Weight: 8 lbs 3 oz   . COLPOSCOPY  2006    Family History  Problem Relation Age of Onset  . Cancer Paternal Uncle   . Cancer Maternal Grandfather   . Heart attack Mother 23  . Seizures Father   . Heart attack Paternal Grandfather     Social History:  reports that she has been smoking cigarettes.  She has never used smokeless tobacco. She reports that she has current or past drug history. Drug: Marijuana. She reports that she does not drink alcohol.  Patient is originally from [place], [place]. She lives in [place].  Patient is unemployed. Patient denies known exposures to radiation on toxins.  The patient is alone today.   Allergies:  Allergies  Allergen Reactions  . Aspirin Nausea Only and Other (See Comments)    Makes stomach bleed and vomits blood  . Naproxen Nausea And Vomiting, Nausea Only and Other (See Comments)    Makes stomach bleed. Vomits blood  . Sumatriptan Succinate Other (See Comments)    Tingling over entire body. Painful tingling in jaw/mouth  . Lactose Intolerance (Gi) Other (See Comments)    Gi upset Can not take whole milk but ice cream and yogurt okay    Current Medications: Current Outpatient Medications  Medication Sig Dispense Refill  . aspirin EC 81 MG tablet Take 1 tablet (81 mg total) by mouth daily. Take after 12 weeks for  prevention of preeclampssia later in pregnancy 300 tablet 2  . diphenhydrAMINE-PE-APAP 25-5-325 MG TABS Take 2 tablets by mouth daily as needed (for congestion).    . flintstones complete (FLINTSTONES) 60 MG chewable tablet Chew 1 tablet by mouth daily.    Marland Kitchen omeprazole (PRILOSEC) 20 MG capsule Take 20 mg by mouth daily.     Marland Kitchen oxyCODONE (OXY IR/ROXICODONE) 5 MG immediate release tablet Take 1 tablet (5 mg total) by mouth every 4 (four) hours as needed for moderate pain. 15 tablet 0   No current facility-administered medications for this visit.     Review of Systems  Constitutional: Positive for  malaise/fatigue. Negative for diaphoresis, fever and weight loss.  HENT: Negative.   Eyes: Negative.   Respiratory: Positive for shortness of breath (exertional). Negative for cough, hemoptysis and sputum production.   Cardiovascular: Positive for leg swelling. Negative for chest pain, palpitations, orthopnea and PND.       Recent echo revealed EF of 55-60% with grade ( diastolic dysfunction.   Gastrointestinal: Negative for abdominal pain, blood in stool, constipation, diarrhea, melena, nausea and vomiting.  Genitourinary: Negative for dysuria, frequency, hematuria and urgency.  Musculoskeletal: Positive for joint pain (carpel tunnel symptoms (R>L) - numbness in hands). Negative for back pain, falls and myalgias.  Skin: Negative for itching and rash.  Neurological: Negative for dizziness, tremors, weakness and headaches.  Endo/Heme/Allergies: Does not bruise/bleed easily.  Psychiatric/Behavioral: Negative for depression, memory loss and suicidal ideas. The patient is not nervous/anxious and does not have insomnia.   All other systems reviewed and are negative.   Performance status (ECOG): 1 - Symptomatic but completely ambulatory  Vital signs There were no vitals taken for this visit.  Physical Exam  Constitutional: She is oriented to person, place, and time and well-developed, well-nourished, and in no distress.  HENT:  Head: Normocephalic and atraumatic.  Long dark hair with green highlights.  Eyes: Pupils are equal, round, and reactive to light. EOM are normal. No scleral icterus.  Neck: Normal range of motion. Neck supple. No tracheal deviation present. No thyromegaly present.  Cardiovascular: Normal rate, regular rhythm and normal heart sounds. Exam reveals no gallop and no friction rub.  No murmur heard. Pulmonary/Chest: Effort normal and breath sounds normal. No respiratory distress. She has no wheezes. She has no rales.  Abdominal: Soft. Bowel sounds are normal. She exhibits no  distension. There is no tenderness.  Musculoskeletal: Normal range of motion. She exhibits no edema or tenderness.  Lymphadenopathy:    She has no cervical adenopathy.    She has no axillary adenopathy.       Right: No inguinal and no supraclavicular adenopathy present.       Left: No inguinal and no supraclavicular adenopathy present.  Neurological: She is alert and oriented to person, place, and time.  Skin: Skin is warm and dry. No rash noted. No erythema.  Psychiatric: Mood, affect and judgment normal.  Nursing note and vitals reviewed.   No visits with results within 3 Day(s) from this visit.  Latest known visit with results is:  Admission on 04/14/2018, Discharged on 04/16/2018  Component Date Value Ref Range Status  . Tricyclic, Ur Screen 30/05/2329 NONE DETECTED  NONE DETECTED Final  . Amphetamines, Ur Screen 04/14/2018 NONE DETECTED  NONE DETECTED Final  . MDMA (Ecstasy)Ur Screen 04/14/2018 NONE DETECTED  NONE DETECTED Final  . Cocaine Metabolite,Ur [place] 04/14/2018 NONE DETECTED  NONE DETECTED Final  . Opiate, Ur Screen 04/14/2018 NONE DETECTED  NONE  DETECTED Final  . Phencyclidine (PCP) Ur S 04/14/2018 NONE DETECTED  NONE DETECTED Final  . Cannabinoid 50 Ng, Ur [place] 04/14/2018 POSITIVE* NONE DETECTED Final  . Barbiturates, Ur Screen 04/14/2018 NONE DETECTED  NONE DETECTED Final  . Benzodiazepine, Ur Scrn 04/14/2018 POSITIVE* NONE DETECTED Final  . Methadone Scn, Ur 04/14/2018 NONE DETECTED  NONE DETECTED Final   Comment: (NOTE) Tricyclics + metabolites, urine    Cutoff 1000 ng/mL Amphetamines + metabolites, urine  Cutoff 1000 ng/mL MDMA (Ecstasy), urine              Cutoff 500 ng/mL Cocaine Metabolite, urine          Cutoff 300 ng/mL Opiate + metabolites, urine        Cutoff 300 ng/mL Phencyclidine (PCP), urine         Cutoff 25 ng/mL Cannabinoid, urine                 Cutoff 50 ng/mL Barbiturates + metabolites, urine  Cutoff 200 ng/mL Benzodiazepine, urine               Cutoff 200 ng/mL Methadone, urine                   Cutoff 300 ng/mL The urine drug screen provides only a preliminary, unconfirmed analytical test result and should not be used for non-medical purposes. Clinical consideration and professional judgment should be applied to any positive drug screen result due to possible interfering substances. A more specific alternate chemical method must be used in order to obtain a confirmed analytical result. Gas chromatography / mass spectrometry (GC/MS) is the preferred confirmat                          ory method. Performed at [facility], [address] [place], [place] [address]   . ABO/RH(D) 04/14/2018    Final                   Value:A POS Performed at [facility], [address], [place], [place] [address]   . Varicella 10/12/2017 Nonimmune   Final    Assessment:  CHEALSEY MIYAMOTO is a 37 y.o. female  with iron deficiency anemia.  Diet appears good.  She has ice pica.  She denies any bleeding.  She was on oral iron.  CBC on 10/12/2017 revealed a hematocrit of 32.4, hemoglobin 9.9, and MCV 67.  Ferritin was 13 with an iron saturation of 15% and a TIBC of 440 on 11/09/2017.  Labs on 12/21/2017 revealed a hematocrit of 32.0, hemoglobin 10.2, and MCV 68.1.  Ferritin was 6.  Iron saturation 3% with a TIBC of 517.  Reticulocytes 1%.  B12 level was low at 195.  Folate normal at 13.9.  TSH was 0.893 (normal) and free T4 0.56 (0.61-1.12) on 12/28/2017.  BCR-ABL was negative on 01/25/2018.  Hemoglobin electrophoresis on 02/22/2018 revealed 3.5% Hgb A2 (1.8-3.2%), and Hgb A 96.5%.  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  She underwent planned cesarean section on 04/14/2018.  She received Venofer weekly x 4 (02/08/2018 - 03/01/2018).  Ferritin has been followed:  13 on 11/09/2017, 6 on 12/21/2017, 12 on 01/24/2018, 11 on 02/07/2018, and 197 on 03/06/2018.    She has B12 deficiency.  B12 was 195  on 12/21/2017 and 177 on 01/24/2018.  She was on oral B12.  She began B12 injections on 01/25/2018 (last 03/29/2018).  Folate was 6.3 on 02/22/2018.  Symptomatically,  she remains fatigued. She is being followed by cardiology for tachycardia.  Heart rate noted to be in the upper limits of normal in clinic today.  She denies chest pain, however continues to be exertionally dyspneic. She complains of bilateral carpel tunnel symptoms (R>L); "hands are sometimes numb". No urinary symptoms to suggest persistent infection. (+) proteinuria being followed closely by OB/GYN due to her PMH significant for PIH.  Exam is stable.  WBC 12.6 ([place] 9800).  Hemoglobin 10.9, hematocrit 34.8, MCV 70.2, and platelets 344,000.  Plan: 1. Labs today: CBC with differential, ferritin, B12, folate. 2. Post partum state -  3. Iron deficiency -  4. B12 deficiency -  5. RTC   6. Discuss interval consult with cardiology.  Etiology of tachycardia unknown.  Patient has had a essentially normal echocardiogram that revealed an EF of 55 to 60%.  Tachycardia felt to be multifactorial due to her pregnancy, IDA, obesity, and physical deconditioning.  Concern still exists for pulmonary embolism, however due to current pregnancy status patient has deferred confirmatory imaging.  Patient to follow-up with cardiology in 3 months or sooner if needed. 7. Discuss anemia.  Hemoglobin 10.9 with microcytic RBC indices (MCV 70.2). Patient is not taking oral iron at this point.  Discussed need for additional intravenous iron replacement following scheduled C-section on 04/14/2018. 8. B12 injection today.  Continue monthly parenteral supplementation.arrier state. Typically patients are asymptomatic with mild microcytic RBC indices. 9. Discussed history of PIH.  Blood pressure 134/90 in clinic today.  Patient with recent proteinuria.  She is being followed closely by her OB/GYN for preeclampsia monitoring.  Continue to follow-up with OB/GYN as already  scheduled. 10. RTC in 1 month for MD assessment, labs (CBC with diff, ferritin), and B12.  Lequita Asal, MD  04/26/2018, 4:58 AM   I saw and evaluated the patient, participating in the key portions of the service and reviewing pertinent diagnostic studies and records.  I reviewed the nurse practitioner's note and agree with the findings and the plan.  The assessment and plan were discussed with the patient.  Additional diagnostic studies of *** are needed to clarify *** and would change the clinical management.  A few ***multiple questions were asked by the patient and answered.   Nolon Stalls, MD 04/26/2018,4:58 AM

## 2018-05-03 ENCOUNTER — Inpatient Hospital Stay: Payer: Medicaid Other

## 2018-05-03 ENCOUNTER — Inpatient Hospital Stay: Payer: Medicaid Other | Attending: Hematology and Oncology | Admitting: Urgent Care

## 2018-05-03 ENCOUNTER — Telehealth: Payer: Self-pay | Admitting: *Deleted

## 2018-05-03 VITALS — BP 133/84 | HR 108 | Temp 98.9°F | Resp 20 | Wt 231.5 lb

## 2018-05-03 DIAGNOSIS — R5383 Other fatigue: Secondary | ICD-10-CM

## 2018-05-03 DIAGNOSIS — Z7982 Long term (current) use of aspirin: Secondary | ICD-10-CM | POA: Diagnosis not present

## 2018-05-03 DIAGNOSIS — R000 Tachycardia, unspecified: Secondary | ICD-10-CM

## 2018-05-03 DIAGNOSIS — K219 Gastro-esophageal reflux disease without esophagitis: Secondary | ICD-10-CM

## 2018-05-03 DIAGNOSIS — Z79899 Other long term (current) drug therapy: Secondary | ICD-10-CM

## 2018-05-03 DIAGNOSIS — E538 Deficiency of other specified B group vitamins: Secondary | ICD-10-CM

## 2018-05-03 DIAGNOSIS — O99013 Anemia complicating pregnancy, third trimester: Secondary | ICD-10-CM

## 2018-05-03 DIAGNOSIS — F319 Bipolar disorder, unspecified: Secondary | ICD-10-CM | POA: Diagnosis not present

## 2018-05-03 DIAGNOSIS — D509 Iron deficiency anemia, unspecified: Secondary | ICD-10-CM

## 2018-05-03 DIAGNOSIS — R0602 Shortness of breath: Secondary | ICD-10-CM | POA: Diagnosis not present

## 2018-05-03 DIAGNOSIS — Z809 Family history of malignant neoplasm, unspecified: Secondary | ICD-10-CM | POA: Diagnosis not present

## 2018-05-03 DIAGNOSIS — F17210 Nicotine dependence, cigarettes, uncomplicated: Secondary | ICD-10-CM

## 2018-05-03 LAB — FOLATE: Folate: 9.3 ng/mL (ref >5.9)

## 2018-05-03 LAB — CBC WITH DIFFERENTIAL/PLATELET
BASOS ABS: 0 10*3/uL (ref 0–0.1)
BASOS PCT: 0 %
Eosinophils Absolute: 0.1 10*3/uL (ref 0–0.7)
Eosinophils Relative: 1 %
HEMATOCRIT: 39.3 % (ref 35.0–47.0)
HEMOGLOBIN: 12.2 g/dL (ref 12.0–16.0)
Lymphocytes Relative: 24 %
Lymphs Abs: 2.9 10*3/uL (ref 1.0–3.6)
MCH: 22 pg — ABNORMAL LOW (ref 26.0–34.0)
MCHC: 31 g/dL — AB (ref 32.0–36.0)
MCV: 71.2 fL — ABNORMAL LOW (ref 80.0–100.0)
MONOS PCT: 5 %
Monocytes Absolute: 0.6 10*3/uL (ref 0.2–0.9)
NEUTROS ABS: 8.6 10*3/uL — AB (ref 1.4–6.5)
NEUTROS PCT: 70 %
Platelets: 478 10*3/uL — ABNORMAL HIGH (ref 150–440)
RBC: 5.52 MIL/uL — AB (ref 3.80–5.20)
RDW: 15.4 % — ABNORMAL HIGH (ref 11.5–14.5)
WBC: 12.3 10*3/uL — AB (ref 3.6–11.0)

## 2018-05-03 LAB — FERRITIN: Ferritin: 26 ng/mL (ref 11–307)

## 2018-05-03 LAB — VITAMIN B12: Vitamin B-12: 486 pg/mL (ref 180–914)

## 2018-05-03 MED ORDER — CYANOCOBALAMIN 1000 MCG/ML IJ SOLN
1000.0000 ug | Freq: Once | INTRAMUSCULAR | Status: AC
  Administered 2018-05-03: 1000 ug via INTRAMUSCULAR

## 2018-05-03 NOTE — Progress Notes (Signed)
Patient had her baby 2 weeks ago.  She states her chest pain has resolved since her C-section. Not sleeping well because of baby.  Otherwise, offers no complaints.

## 2018-05-03 NOTE — Telephone Encounter (Signed)
-----   Message from Karen Kitchens, NP sent at 05/03/2018  4:54 PM EDT ----- Ferritin low at 26. Not anemic. We can set her up for Venofer x 2.   Gaspar Bidding  ----- Message ----- From: Interface, Lab In [place] Sent: 05/03/2018  11:15 AM EDT To: Karen Kitchens, NP

## 2018-05-03 NOTE — Telephone Encounter (Signed)
Called patient and LVM that her ferritin is low.  She is not anemic.  We can do Venofer weekly X 2 if she would like.  Asked patient to call me back in the [facility] clinic tomorrow to let me know if she would like to get Venofer. Will schedule after I hear from her.

## 2018-05-03 NOTE — Progress Notes (Signed)
[facility] day: 05/03/2018     Chief Complaint: Dominique Dyer is a 37 y.o. female with iron deficiency anemia and B12 deficiency s/p C-section who is seen for assessment.   HPI:  The patient was last seen in the hematology clinic on 03/29/2018.  At that time, she was 53 pregnant.  She had chronic fatigue. She was being followed by cardiology for tachycardia.  Heart rate was in the upper limits of normal in clinic.  She denied chest pain, however she continued to be exertionally dyspneic. She had bilateral carpel tunnel symptoms (R>L); "hands are sometimes numb". She denied any urinary symptoms to suggest persistent infection. (+) proteinuria being followed closely by OB/GYN due to her PMH significant for PIH.  Exam was stable.  WBC was 12,600 ([place] 9800).  Hemoglobin 10.9, hematocrit 34.8, MCV 70.2, and platelets 344,000.  Discussions were held regarding additional IV iron following C section.  She received B12.  Labs on 04/13/2018 revealed a hematocrit of 35.6, hemoglobin 11.5, MCV 69.9,  Creatinine was 0.73.  She underwent scheduled C-section and BTL on 04/14/2018. Notes reviewed.  Patient noted to have undergone an uncomplicated surgical course.  Patient was discharged home on 04/16/2018.  Patient was seen in postoperative follow-up on 04/20/2018 by Dr. Jeannie Fend.  Notes reviewed.  Patient was cleared to resume normal activities with the exception of intercourse and heavy lifting.  Patient follow-up with OB/GYN in 1 month.  During the interim, patient is doing well overall following her recent C-section.  She is adjusting to having a newborn in the house well.  She notes that she is not sleeping much at this point.  She remains fatigued.  Minimal bleeding reported.  Patient denies recurrent episodes of tachycardia until today. She presents to the clinic with a resting rate of 108.  She advises that when she went in for her surgical procedure her heart  rate was in the 80s and has remained normal since then. She denies chest pain and episodes of palpitations. Heart rate rechecked at end of assessment and found to be in the low 90s.   Patient denies that she has experienced any B symptoms. She denies any interval infections. Patient advises that she maintains an adequate appetite. She is eating well. Patient maintains a diet rich in iron. She indicates that she eats meat and green leafy vegetables on a consistent basis. Weight today is 231 lb 7.7 oz (105 kg), which compared to her last visit to the clinic, represents a 23 pound weight decrease (post partum).     Patient denies pain in the clinic today.  Past Medical History:  Diagnosis Date  . Anemia 2019   iron deficiency and b12 deficiency  . Anginal pain ([place]) 2019   with pregnancy. ekg fine. cardiology reviewed  . Anxiety   . Bipolar disorder ([place])   . Dyspnea    with pregnancy  . Dysrhythmia 2019   tachycardia with pregnancy and panic attacks  . GERD (gastroesophageal reflux disease)   . Hypertension 2019   with pregnancy  . Migraines    has not had as many recently.  at most, once a month  . Panic attacks   . Pica 2019   ice    Past Surgical History:  Procedure Laterality Date  . CESAREAN SECTION    . CESAREAN SECTION Bilateral 04/14/2018   Procedure: REPEAT CESAREAN SECTION WITH BTL;  Surgeon: [name], MD;  Location: [facility] ORS;  Service: Obstetrics;  Laterality: Bilateral;  Birth: 10:45 Sex: Female Weight: 8 lbs 3 oz   . COLPOSCOPY  2006    Family History  Problem Relation Age of Onset  . Cancer Paternal Uncle   . Cancer Maternal Grandfather   . Heart attack Mother 56  . Seizures Father   . Heart attack Paternal Grandfather     Social History:  reports that she has been smoking cigarettes. She has never used smokeless tobacco. She reports that she has current or past drug history. Drug: Marijuana. She reports that she does not drink alcohol.  Patient is  originally from [place], [place]. She lives in [place].  Patient is unemployed. Patient denies known exposures to radiation on toxins.  The patient is alone today.   Allergies:  Allergies  Allergen Reactions  . Aspirin Nausea Only and Other (See Comments)    Makes stomach bleed and vomits blood  . Naproxen Nausea And Vomiting, Nausea Only and Other (See Comments)    Makes stomach bleed. Vomits blood  . Sumatriptan Succinate Other (See Comments)    Tingling over entire body. Painful tingling in jaw/mouth  . Lactose Intolerance (Gi) Other (See Comments)    Gi upset Can not take whole milk but ice cream and yogurt okay    Current Medications: Current Outpatient Medications  Medication Sig Dispense Refill  . aspirin EC 81 MG tablet Take 1 tablet (81 mg total) by mouth daily. Take after 12 weeks for prevention of preeclampssia later in pregnancy 300 tablet 2  . diphenhydrAMINE-PE-APAP 25-5-325 MG TABS Take 2 tablets by mouth daily as needed (for congestion).    . flintstones complete (FLINTSTONES) 60 MG chewable tablet Chew 1 tablet by mouth daily.    Marland Kitchen omeprazole (PRILOSEC) 20 MG capsule Take 20 mg by mouth daily.     Marland Kitchen oxyCODONE (OXY IR/ROXICODONE) 5 MG immediate release tablet Take 1 tablet (5 mg total) by mouth every 4 (four) hours as needed for moderate pain. 15 tablet 0   No current facility-administered medications for this visit.     Review of Systems  Constitutional: Positive for malaise/fatigue and weight loss (down 23 pounds; post partum). Negative for diaphoresis and fever.       "I am doing good. Adjusting to having a baby in the house again".   HENT: Negative.   Eyes: Negative.   Respiratory: Positive for shortness of breath (exertional - improved). Negative for cough, hemoptysis and sputum production.   Cardiovascular: Positive for leg swelling (trace). Negative for chest pain, palpitations, orthopnea and PND.       Recent echocardiogram revealed EF of 55-60% with grade  1 diastolic dysfunction. TACHYcardia.   Gastrointestinal: Negative for abdominal pain, blood in stool, constipation, diarrhea, melena, nausea and vomiting.  Genitourinary: Negative for dysuria, frequency, hematuria and urgency.  Musculoskeletal: Positive for joint pain (carpel tunnel symptoms (R>L) - intermittent numbness in hands). Negative for back pain, falls and myalgias.  Skin: Negative for itching and rash.  Neurological: Negative for dizziness, tremors, weakness and headaches.  Endo/Heme/Allergies: Does not bruise/bleed easily.  Psychiatric/Behavioral: Negative for depression, memory loss and suicidal ideas. The patient has insomnia (newborn in home). The patient is not nervous/anxious.   All other systems reviewed and are negative.   Performance status (ECOG): 1 - Symptomatic but completely ambulatory  Vital signs BP 133/84 (BP Location: Left Arm, Patient Position: Sitting)   Pulse (!) 108   Temp 98.9 F (37.2 C) (Tympanic)   Resp 20  Wt 231 lb 7.7 oz (105 kg)   BMI 39.73 kg/m   Physical Exam  Constitutional: She is oriented to person, place, and time and well-developed, well-nourished, and in no distress.  HENT:  Head: Normocephalic and atraumatic.  Long dark hair  Eyes: Pupils are equal, round, and reactive to light. EOM are normal. No scleral icterus.  Neck: Normal range of motion. Neck supple. No tracheal deviation present. No thyromegaly present.  Cardiovascular: Regular rhythm and normal heart sounds. Tachycardia present. Exam reveals no gallop and no friction rub.  No murmur heard. Pulmonary/Chest: Effort normal and breath sounds normal. No respiratory distress. She has no wheezes. She has no rales.  Abdominal: Soft. Bowel sounds are normal. She exhibits no distension. There is no hepatosplenomegaly. There is generalized tenderness (s/p C-section).  Surgical incision - healing well with no evidence of infection.   Musculoskeletal: Normal range of motion. She exhibits  no edema or tenderness.  Neurological: She is alert and oriented to person, place, and time.  Skin: Skin is warm and dry. No rash noted. No erythema.  Psychiatric: Mood, affect and judgment normal.  Nursing note and vitals reviewed.   Appointment on 05/03/2018  Component Date Value Ref Range Status  . WBC 05/03/2018 12.3* 3.6 - 11.0 K/uL Final  . RBC 05/03/2018 5.52* 3.80 - 5.20 MIL/uL Final  . Hemoglobin 05/03/2018 12.2  12.0 - 16.0 g/dL Final  . HCT 05/03/2018 39.3  35.0 - 47.0 % Final  . MCV 05/03/2018 71.2* 80.0 - 100.0 fL Final  . MCH 05/03/2018 22.0* 26.0 - 34.0 pg Final  . MCHC 05/03/2018 31.0* 32.0 - 36.0 g/dL Final  . RDW 05/03/2018 15.4* 11.5 - 14.5 % Final  . Platelets 05/03/2018 478* 150 - 440 K/uL Final  . Neutrophils Relative % 05/03/2018 70  % Final  . Neutro Abs 05/03/2018 8.6* 1.4 - 6.5 K/uL Final  . Lymphocytes Relative 05/03/2018 24  % Final  . Lymphs Abs 05/03/2018 2.9  1.0 - 3.6 K/uL Final  . Monocytes Relative 05/03/2018 5  % Final  . Monocytes Absolute 05/03/2018 0.6  0.2 - 0.9 K/uL Final  . Eosinophils Relative 05/03/2018 1  % Final  . Eosinophils Absolute 05/03/2018 0.1  0 - 0.7 K/uL Final  . Basophils Relative 05/03/2018 0  % Final  . Basophils Absolute 05/03/2018 0.0  0 - 0.1 K/uL Final   Performed at [facility], [address] [place], [place] [address]    Assessment:  GEORGANNA MAXSON is a 37 y.o. female  with iron deficiency anemia.  Diet appears good.  She has ice pica.  She denies any bleeding.  She was on oral iron.  CBC on 10/12/2017 revealed a hematocrit of 32.4, hemoglobin 9.9, and MCV 67.  Ferritin was 13 with an iron saturation of 15% and a TIBC of 440 on 11/09/2017.  Labs on 12/21/2017 revealed a hematocrit of 32.0, hemoglobin 10.2, and MCV 68.1.  Ferritin was 6.  Iron saturation 3% with a TIBC of 517.  Reticulocytes 1%.  B12 level was low at 195.  Folate normal at 13.9.  TSH was 0.893 (normal) and free T4 0.56 (0.61-1.12) on  12/28/2017.  BCR-ABL was negative on 01/25/2018.  Hemoglobin electrophoresis on 02/22/2018 revealed 3.5% Hgb A2 (1.8-3.2%), and Hgb A 96.5%.  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  She underwent planned cesarean section on 04/14/2018.  She received Venofer weekly x 4 (02/08/2018 - 03/01/2018).  Ferritin has  been followed:  13 on 11/09/2017, 6 on 12/21/2017, 12 on 01/24/2018, 11 on 02/07/2018, and 197 on 03/06/2018.    She has B12 deficiency.  B12 was 195 on 12/21/2017 and 177 on 01/24/2018.  She was on oral B12.  She began B12 injections on 01/25/2018 (last 03/29/2018).  Folate was 6.3 on 02/22/2018.  Symptomatically, patient remains fatigued. Exertional dyspnea has improved. She has not experienced any recurrent TACHYcardia until today. She is not sleeping much due to having a newborn in the home. She is recovering well from recent C-section. She is following up with OB/GYN on a regular basis. Continues to have intermittent carpel tunnel symptoms. Exam reveals a healing surgical incision to her lower abdomen (C-section) with no evidence of infection.  WBC 12,300 ([place]).  Hemoglobin 12.2, hematocrit 39.3, MCV 71.2, and platelets 478,000.  Ferritin is low at 26.  Plan: 1. Labs today: CBC with differential, ferritin, B12, folate. 2. Post partum state - recovering  Patient recovering well from recent cesarean section.  Nominal incision reveals no evidence of infection.  She is being followed by Dr. Jeannie Fend (OB/GYN).  Next scheduled follow-up appointment is in 1 month.  Patient is and good spirits today.  There is no concern for postpartum depression at this point.  This will be further monitored by OB/GYN and when patient returns to clinic here. 3. Iron deficiency - ongoing  Hemoglobin and hematocrit stable. Remains microcytic with a MCV of 71.2. Ferritin low at 26.   Patient is s/p recent C-section. Anticipate improvement in iron levels, however patient is  fatigued and is requesting additional intravenous replacement. Will schedule her for weekly Venofer 200 mg IV x 2 doses to replenish iron stores. 4. B12 deficiency - stable  Continues on monthly parenteral supplementation at this point.  Will check B12 level today. 5. TACHYcardia - ongoing  Initial heart rate elevated to 108 today in the clinic.  Patient notes that she has not had any known tachycardia episodes since her C-section.  Her rate was rechecked at the end of the assessment today and found to be in the low 90s.  Patient denies pain or increased swelling in her lower extremities.  She has not experienced any claudication pain with ambulation.  Patient encouraged to monitor heart rate periodically at home.  She was educated on the signs and symptoms of a postoperative pulmonary embolism following her recent surgical procedure.  Patient aware that any increase in shortness of breath, chest pain, palpitations, or even a dry cough will necessitate further evaluation.  6. RTC monthly x2 for B12 injections. 7. RTC in 3 months for MD assessment, labs (CBC with differential, ferritin-day before), and +/- Venofer.    Honor Loh, NP  05/03/2018, 11:23 AM

## 2018-05-24 ENCOUNTER — Inpatient Hospital Stay: Payer: Medicaid Other | Attending: Hematology and Oncology

## 2018-05-24 DIAGNOSIS — E538 Deficiency of other specified B group vitamins: Secondary | ICD-10-CM | POA: Insufficient documentation

## 2018-05-24 DIAGNOSIS — Z79899 Other long term (current) drug therapy: Secondary | ICD-10-CM | POA: Insufficient documentation

## 2018-05-24 DIAGNOSIS — D509 Iron deficiency anemia, unspecified: Secondary | ICD-10-CM | POA: Insufficient documentation

## 2018-05-24 MED ORDER — CYANOCOBALAMIN 1000 MCG/ML IJ SOLN
1000.0000 ug | Freq: Once | INTRAMUSCULAR | Status: AC
  Administered 2018-05-24: 1000 ug via INTRAMUSCULAR

## 2018-05-24 NOTE — Patient Instructions (Signed)
Cyanocobalamin, Vitamin B12 injection What is this medicine? CYANOCOBALAMIN (sye an oh koe BAL a min) is a man made form of vitamin B12. Vitamin B12 is used in the growth of healthy blood cells, nerve cells, and proteins in the body. It also helps with the metabolism of fats and carbohydrates. This medicine is used to treat people who can not absorb vitamin B12. This medicine may be used for other purposes; ask your health care provider or pharmacist if you have questions. COMMON BRAND NAME(S): B-12 Compliance Kit, B-12 Injection Kit, Cyomin, LA-12, Nutri-Twelve, Physicians EZ Use B-12, Primabalt What should I tell my health care provider before I take this medicine? They need to know if you have any of these conditions: -kidney disease -Leber's disease -megaloblastic anemia -an unusual or allergic reaction to cyanocobalamin, cobalt, other medicines, foods, dyes, or preservatives -pregnant or trying to get pregnant -breast-feeding How should I use this medicine? This medicine is injected into a muscle or deeply under the skin. It is usually given by a health care professional in a clinic or doctor's office. However, your doctor may teach you how to inject yourself. Follow all instructions. Talk to your pediatrician regarding the use of this medicine in children. Special care may be needed. Overdosage: If you think you have taken too much of this medicine contact a poison control center or emergency room at once. NOTE: This medicine is only for you. Do not share this medicine with others. What if I miss a dose? If you are given your dose at a clinic or doctor's office, call to reschedule your appointment. If you give your own injections and you miss a dose, take it as soon as you can. If it is almost time for your next dose, take only that dose. Do not take double or extra doses. What may interact with this medicine? -colchicine -heavy alcohol intake This list may not describe all possible  interactions. Give your health care provider a list of all the medicines, herbs, non-prescription drugs, or dietary supplements you use. Also tell them if you smoke, drink alcohol, or use illegal drugs. Some items may interact with your medicine. What should I watch for while using this medicine? Visit your doctor or health care professional regularly. You may need blood work done while you are taking this medicine. You may need to follow a special diet. Talk to your doctor. Limit your alcohol intake and avoid smoking to get the best benefit. What side effects may I notice from receiving this medicine? Side effects that you should report to your doctor or health care professional as soon as possible: -allergic reactions like skin rash, itching or hives, swelling of the face, lips, or tongue -blue tint to skin -chest tightness, pain -difficulty breathing, wheezing -dizziness -red, swollen painful area on the leg Side effects that usually do not require medical attention (report to your doctor or health care professional if they continue or are bothersome): -diarrhea -headache This list may not describe all possible side effects. Call your doctor for medical advice about side effects. You may report side effects to FDA at 1-800-FDA-1088. Where should I keep my medicine? Keep out of the reach of children. Store at room temperature between 15 and 30 degrees C (59 and 85 degrees F). Protect from light. Throw away any unused medicine after the expiration date. NOTE: This sheet is a summary. It may not cover all possible information. If you have questions about this medicine, talk to your doctor, pharmacist, or   health care provider.  2018 Elsevier/Gold Standard (2007-12-18 22:10:20)  

## 2018-05-25 ENCOUNTER — Encounter: Payer: Medicaid Other | Admitting: Obstetrics and Gynecology

## 2018-05-30 ENCOUNTER — Encounter: Payer: Medicaid Other | Admitting: Obstetrics and Gynecology

## 2018-06-19 NOTE — Progress Notes (Signed)
Pt presents today for incision check. Pt is doing well.

## 2018-06-20 ENCOUNTER — Ambulatory Visit (INDEPENDENT_AMBULATORY_CARE_PROVIDER_SITE_OTHER): Payer: Medicaid Other | Admitting: Obstetrics and Gynecology

## 2018-06-20 ENCOUNTER — Encounter: Payer: Self-pay | Admitting: Obstetrics and Gynecology

## 2018-06-20 VITALS — BP 117/74 | HR 99 | Ht 64.0 in | Wt 240.0 lb

## 2018-06-20 DIAGNOSIS — Z9889 Other specified postprocedural states: Secondary | ICD-10-CM

## 2018-06-20 NOTE — Progress Notes (Signed)
HPI:      [name] is a 37 y.o. G2P1001 who LMP was No LMP recorded.  Subjective:   She presents today approximate 7 weeks from cesarean delivery and tubal ligation.  She is doing well.  She reports she has not had a period yet.  She is not breast-feeding.  She has no complaints.    Hx: The following portions of the patient's history were reviewed and updated as appropriate:             She  has a past medical history of Anemia (2019), Anginal pain ([place]) (2019), Anxiety, Bipolar disorder ([place]), Dyspnea, Dysrhythmia (2019), GERD (gastroesophageal reflux disease), Hypertension (2019), Migraines, Panic attacks, and Pica (2019). She does not have any pertinent problems on file. She  has a past surgical history that includes Cesarean section; Colposcopy (2006); and Cesarean section (Bilateral, 04/14/2018). Her family history includes Cancer in her maternal grandfather and paternal uncle; Heart attack in her paternal grandfather; Heart attack (age of onset: 53) in her mother; Seizures in her father. She  reports that she has been smoking cigarettes. She has never used smokeless tobacco. She reports that she has current or past drug history. Drug: Marijuana. She reports that she does not drink alcohol. She has a current medication list which includes the following prescription(s): alprazolam, diphenhydramine-pe-apap, lamotrigine, omeprazole, aspirin ec, flintstones complete, and oxycodone. She is allergic to aspirin; naproxen; sumatriptan succinate; and lactose intolerance (gi).       Review of Systems:  Review of Systems  Constitutional: Denied constitutional symptoms, night sweats, recent illness, fatigue, fever, insomnia and weight loss.  Eyes: Denied eye symptoms, eye pain, photophobia, vision change and visual disturbance.  Ears/Nose/Throat/Neck: Denied ear, nose, throat or neck symptoms, hearing loss, nasal discharge, sinus congestion and sore throat.  Cardiovascular: Denied  cardiovascular symptoms, arrhythmia, chest pain/pressure, edema, exercise intolerance, orthopnea and palpitations.  Respiratory: Denied pulmonary symptoms, asthma, pleuritic pain, productive sputum, cough, dyspnea and wheezing.  Gastrointestinal: Denied, gastro-esophageal reflux, melena, nausea and vomiting.  Genitourinary: Denied genitourinary symptoms including symptomatic vaginal discharge, pelvic relaxation issues, and urinary complaints.  Musculoskeletal: Denied musculoskeletal symptoms, stiffness, swelling, muscle weakness and myalgia.  Dermatologic: Denied dermatology symptoms, rash and scar.  Neurologic: Denied neurology symptoms, dizziness, headache, neck pain and syncope.  Psychiatric: Denied psychiatric symptoms, anxiety and depression.  Endocrine: Denied endocrine symptoms including hot flashes and night sweats.   Meds:   Current Outpatient Medications on File Prior to Visit  Medication Sig Dispense Refill  . ALPRAZolam (XANAX) 0.25 MG tablet Take 0.25 mg by mouth 3 (three) times daily.    . diphenhydrAMINE-PE-APAP 25-5-325 MG TABS Take 2 tablets by mouth daily as needed (for congestion).    Marland Kitchen lamoTRIgine (LAMICTAL) 100 MG tablet Take 300 mg by mouth daily.  0  . omeprazole (PRILOSEC) 20 MG capsule Take 20 mg by mouth daily.     Marland Kitchen aspirin EC 81 MG tablet Take 1 tablet (81 mg total) by mouth daily. Take after 12 weeks for prevention of preeclampssia later in pregnancy (Patient not taking: Reported on 06/20/2018) 300 tablet 2  . flintstones complete (FLINTSTONES) 60 MG chewable tablet Chew 1 tablet by mouth daily.    Marland Kitchen oxyCODONE (OXY IR/ROXICODONE) 5 MG immediate release tablet Take 1 tablet (5 mg total) by mouth every 4 (four) hours as needed for moderate pain. (Patient not taking: Reported on 06/20/2018) 15 tablet 0   No current facility-administered medications on file prior to visit.  Objective:     Vitals:   06/20/18 0813  BP: 117/74  Pulse: 99                Abdomen: Soft.  Non-tender.  No masses.  No HSM.  Incision/s: Intact.  Healing well.  No erythema.  No drainage.      Assessment:    G2P1001 Patient Active Problem List   Diagnosis Date Noted  . Delivery by cesarean section using transverse incision of lower segment of uterus 04/14/2018  . Carpal tunnel syndrome during pregnancy 03/28/2018  . Sinus tachycardia 03/08/2018  . Elevated blood pressure affecting pregnancy in third trimester, antepartum 03/08/2018  . Atypical chest pain 03/08/2018  . Shortness of breath 03/08/2018  . Obesity in pregnancy 03/01/2018  . Leucocytosis 02/08/2018  . B12 deficiency 02/01/2018  . Low vitamin B12 level 12/28/2017  . Goals of care, counseling/discussion 12/28/2017  . Iron deficiency anemia 11/16/2017  . History of anemia 11/09/2017  . Anemia of pregnancy in third trimester 11/09/2017  . H/O cesarean section complicating pregnancy 62/37/6283  . Marijuana abuse 11/09/2017  . History of pregnancy induced hypertension 11/09/2017  . Advanced maternal age in multigravida 11/09/2017  . Atypical squamous cell changes of undetermined significance (ASCUS) on cervical cytology with positive high risk human papilloma virus (HPV) 11/09/2017     1. Post-operative state     Patient doing well.   Plan:            1.  Patient may resume normal activities with exception of heavy lifting.  2.  Because of previous abnormal Pap will recommend annual examination with Pap in January followed by colposcopy if necessary. Orders No orders of the defined types were placed in this encounter.   No orders of the defined types were placed in this encounter.     F/U  Return in about 4 months (around 10/21/2018).  Finis Bud, M.D. 06/20/2018 9:00 AM

## 2018-06-21 ENCOUNTER — Inpatient Hospital Stay: Payer: Medicaid Other | Attending: Hematology and Oncology

## 2018-06-21 DIAGNOSIS — E538 Deficiency of other specified B group vitamins: Secondary | ICD-10-CM | POA: Insufficient documentation

## 2018-06-21 DIAGNOSIS — Z79899 Other long term (current) drug therapy: Secondary | ICD-10-CM | POA: Insufficient documentation

## 2018-06-21 DIAGNOSIS — D509 Iron deficiency anemia, unspecified: Secondary | ICD-10-CM | POA: Insufficient documentation

## 2018-06-29 ENCOUNTER — Ambulatory Visit: Payer: Medicaid Other | Admitting: Internal Medicine

## 2018-06-29 NOTE — Progress Notes (Deleted)
Follow-up Outpatient Visit Date: 06/29/2018  Primary Care Provider: [facility], [place] [facility] [place] [address]  Chief Complaint: ***  HPI:  Dominique Dyer is a 37 y.o. year-old female with history of sinus tachycardia, bipolar disorder, anxiety, and panic attacks, who presents for follow-up of tachycardia.  I met Ms. Danne Baxter in June.  She was found to have elevated heart rates beginning 2 months into her pregnancy.  This was exacerbated by concurrent UTI.  She also reported shortness of breath and bandlike discomfort around her chest at times.  Patient was referred to the ED to exclude pulmonary embolism at that time.  D-dimer was found to be elevated but CTA was deferred by the ED team.  She followed up with Christell Faith, PA, in early July, at which time she reported feeling about the same.  Echocardiogram performed before that visit was unrevealing.  No medication changes or additional testing were pursued.  She underwent C-section and tubal ligation in late July.  --------------------------------------------------------------------------------------------------  Cardiovascular History & Procedures: Cardiovascular Problems:  Tachycardia  Atypical chest pain  Risk Factors:  None  Cath/PCI:  None  CV Surgery:  None  EP Procedures and Devices:  None  Non-Invasive Evaluation(s):  Echocardiogram (03/17/2018): Normal LV size and wall thickness.  LVEF 55-60% with normal wall motion.  Grade 1 diastolic dysfunction.  Normal RV size and function.  Recent CV Pertinent Labs: Lab Results  Component Value Date   K 4.1 04/13/2018   K 3.1 (L) 05/10/2014   BUN 7 04/13/2018   BUN 14 05/10/2014   CREATININE 0.73 04/13/2018   CREATININE 1.10 05/10/2014    Past medical and surgical history were reviewed and updated in EPIC.  No outpatient medications have been marked as taking for the 06/29/18 encounter (Appointment) with Afsheen Antony, Harrell Gave, MD.    Allergies:  Aspirin; Naproxen; Sumatriptan succinate; and Lactose intolerance (gi)  Social History   Tobacco Use  . Smoking status: Current Every Day Smoker    Types: Cigarettes  . Smokeless tobacco: Never Used  Substance Use Topics  . Alcohol use: No  . Drug use: Not Currently    Types: Marijuana    Comment: last smoked 1 week ago    Family History  Problem Relation Age of Onset  . Cancer Paternal Uncle   . Cancer Maternal Grandfather   . Heart attack Mother 60  . Seizures Father   . Heart attack Paternal Grandfather     Review of Systems: A 12-system review of systems was performed and was negative except as noted in the HPI.  --------------------------------------------------------------------------------------------------  Physical Exam: There were no vitals taken for this visit.  General:  *** HEENT: No conjunctival pallor or scleral icterus. Moist mucous membranes.  OP clear. Neck: Supple without lymphadenopathy, thyromegaly, JVD, or HJR. No carotid bruit. Lungs: Normal work of breathing. Clear to auscultation bilaterally without wheezes or crackles. Heart: Regular rate and rhythm without murmurs, rubs, or gallops. Non-displaced PMI. Abd: Bowel sounds present. Soft, NT/ND without hepatosplenomegaly Ext: No lower extremity edema. Radial, PT, and DP pulses are 2+ bilaterally. Skin: Warm and dry without rash.  EKG:  ***  Lab Results  Component Value Date   WBC 12.3 (H) 05/03/2018   HGB 12.2 05/03/2018   HCT 39.3 05/03/2018   MCV 71.2 (L) 05/03/2018   PLT 478 (H) 05/03/2018    Lab Results  Component Value Date   NA 135 04/13/2018   K 4.1 04/13/2018   CL 105 04/13/2018  CO2 21 (L) 04/13/2018   BUN 7 04/13/2018   CREATININE 0.73 04/13/2018   GLUCOSE 83 04/13/2018   ALT 12 (L) 06/20/2017    No results found for: CHOL, HDL, LDLCALC, LDLDIRECT, TRIG,  CHOLHDL  --------------------------------------------------------------------------------------------------  ASSESSMENT AND PLAN: ***  Nelva Bush, MD 06/29/2018 6:58 AM

## 2018-06-30 ENCOUNTER — Encounter: Payer: Self-pay | Admitting: Urgent Care

## 2018-06-30 ENCOUNTER — Encounter: Payer: Self-pay | Admitting: Internal Medicine

## 2018-07-19 ENCOUNTER — Inpatient Hospital Stay: Payer: Medicaid Other

## 2018-07-19 VITALS — BP 117/79 | HR 74 | Temp 96.8°F | Resp 18

## 2018-07-19 DIAGNOSIS — D509 Iron deficiency anemia, unspecified: Secondary | ICD-10-CM | POA: Diagnosis present

## 2018-07-19 DIAGNOSIS — E538 Deficiency of other specified B group vitamins: Secondary | ICD-10-CM | POA: Diagnosis not present

## 2018-07-19 DIAGNOSIS — Z79899 Other long term (current) drug therapy: Secondary | ICD-10-CM | POA: Diagnosis not present

## 2018-07-19 MED ORDER — CYANOCOBALAMIN 1000 MCG/ML IJ SOLN
1000.0000 ug | Freq: Once | INTRAMUSCULAR | Status: AC
  Administered 2018-07-19: 1000 ug via INTRAMUSCULAR

## 2018-07-19 NOTE — Patient Instructions (Signed)
Cyanocobalamin, Vitamin B12 injection What is this medicine? CYANOCOBALAMIN (sye an oh koe BAL a min) is a man made form of vitamin B12. Vitamin B12 is used in the growth of healthy blood cells, nerve cells, and proteins in the body. It also helps with the metabolism of fats and carbohydrates. This medicine is used to treat people who can not absorb vitamin B12. This medicine may be used for other purposes; ask your health care provider or pharmacist if you have questions. COMMON BRAND NAME(S): B-12 Compliance Kit, B-12 Injection Kit, Cyomin, LA-12, Nutri-Twelve, Physicians EZ Use B-12, Primabalt What should I tell my health care provider before I take this medicine? They need to know if you have any of these conditions: -kidney disease -Leber's disease -megaloblastic anemia -an unusual or allergic reaction to cyanocobalamin, cobalt, other medicines, foods, dyes, or preservatives -pregnant or trying to get pregnant -breast-feeding How should I use this medicine? This medicine is injected into a muscle or deeply under the skin. It is usually given by a health care professional in a clinic or doctor's office. However, your doctor may teach you how to inject yourself. Follow all instructions. Talk to your pediatrician regarding the use of this medicine in children. Special care may be needed. Overdosage: If you think you have taken too much of this medicine contact a poison control center or emergency room at once. NOTE: This medicine is only for you. Do not share this medicine with others. What if I miss a dose? If you are given your dose at a clinic or doctor's office, call to reschedule your appointment. If you give your own injections and you miss a dose, take it as soon as you can. If it is almost time for your next dose, take only that dose. Do not take double or extra doses. What may interact with this medicine? -colchicine -heavy alcohol intake This list may not describe all possible  interactions. Give your health care provider a list of all the medicines, herbs, non-prescription drugs, or dietary supplements you use. Also tell them if you smoke, drink alcohol, or use illegal drugs. Some items may interact with your medicine. What should I watch for while using this medicine? Visit your doctor or health care professional regularly. You may need blood work done while you are taking this medicine. You may need to follow a special diet. Talk to your doctor. Limit your alcohol intake and avoid smoking to get the best benefit. What side effects may I notice from receiving this medicine? Side effects that you should report to your doctor or health care professional as soon as possible: -allergic reactions like skin rash, itching or hives, swelling of the face, lips, or tongue -blue tint to skin -chest tightness, pain -difficulty breathing, wheezing -dizziness -red, swollen painful area on the leg Side effects that usually do not require medical attention (report to your doctor or health care professional if they continue or are bothersome): -diarrhea -headache This list may not describe all possible side effects. Call your doctor for medical advice about side effects. You may report side effects to FDA at 1-800-FDA-1088. Where should I keep my medicine? Keep out of the reach of children. Store at room temperature between 15 and 30 degrees C (59 and 85 degrees F). Protect from light. Throw away any unused medicine after the expiration date. NOTE: This sheet is a summary. It may not cover all possible information. If you have questions about this medicine, talk to your doctor, pharmacist, or   health care provider.  2018 Elsevier/Gold Standard (2007-12-18 22:10:20)  

## 2018-08-01 ENCOUNTER — Inpatient Hospital Stay: Payer: Medicaid Other | Attending: Hematology and Oncology

## 2018-08-01 DIAGNOSIS — D509 Iron deficiency anemia, unspecified: Secondary | ICD-10-CM | POA: Diagnosis present

## 2018-08-01 LAB — CBC WITH DIFFERENTIAL/PLATELET
ABS IMMATURE GRANULOCYTES: 0.04 10*3/uL (ref 0.00–0.07)
Basophils Absolute: 0.1 10*3/uL (ref 0.0–0.1)
Basophils Relative: 1 %
EOS PCT: 2 %
Eosinophils Absolute: 0.2 10*3/uL (ref 0.0–0.5)
HEMATOCRIT: 36.4 % (ref 36.0–46.0)
HEMOGLOBIN: 10.9 g/dL — AB (ref 12.0–15.0)
Immature Granulocytes: 0 %
LYMPHS ABS: 3.3 10*3/uL (ref 0.7–4.0)
LYMPHS PCT: 29 %
MCH: 20.9 pg — ABNORMAL LOW (ref 26.0–34.0)
MCHC: 29.9 g/dL — ABNORMAL LOW (ref 30.0–36.0)
MCV: 69.9 fL — ABNORMAL LOW (ref 80.0–100.0)
MONOS PCT: 8 %
Monocytes Absolute: 0.9 10*3/uL (ref 0.1–1.0)
NEUTROS ABS: 6.8 10*3/uL (ref 1.7–7.7)
Neutrophils Relative %: 60 %
Platelets: 423 10*3/uL — ABNORMAL HIGH (ref 150–400)
RBC: 5.21 MIL/uL — AB (ref 3.87–5.11)
RDW: 15.5 % (ref 11.5–15.5)
WBC: 11.2 10*3/uL — ABNORMAL HIGH (ref 4.0–10.5)
nRBC: 0 % (ref 0.0–0.2)

## 2018-08-01 LAB — FERRITIN: Ferritin: 33 ng/mL (ref 11–307)

## 2018-08-02 ENCOUNTER — Other Ambulatory Visit: Payer: Self-pay

## 2018-08-02 ENCOUNTER — Inpatient Hospital Stay: Payer: Medicaid Other

## 2018-08-02 ENCOUNTER — Inpatient Hospital Stay (HOSPITAL_BASED_OUTPATIENT_CLINIC_OR_DEPARTMENT_OTHER): Payer: Medicaid Other | Admitting: Hematology and Oncology

## 2018-08-02 VITALS — BP 116/78 | HR 82 | Temp 97.2°F | Resp 18 | Wt 240.9 lb

## 2018-08-02 DIAGNOSIS — F17210 Nicotine dependence, cigarettes, uncomplicated: Secondary | ICD-10-CM

## 2018-08-02 DIAGNOSIS — E538 Deficiency of other specified B group vitamins: Secondary | ICD-10-CM

## 2018-08-02 DIAGNOSIS — D509 Iron deficiency anemia, unspecified: Secondary | ICD-10-CM | POA: Diagnosis not present

## 2018-08-02 DIAGNOSIS — Z79899 Other long term (current) drug therapy: Secondary | ICD-10-CM

## 2018-08-02 NOTE — Progress Notes (Signed)
[facility] day: 08/02/2018     Chief Complaint: Dominique Dyer is a 37 y.o. female with iron deficiency anemia and B12 deficiency who is seen for 3 month assessment.   HPI:  The patient was last seen in the hematology clinic on 05/03/2018.  At that time,  she remained fatigued. Exertional dyspnea had improved. She had not experienced any recurrent TACHYcardia until that day. She was not sleeping much due to having a newborn in the home. She was recovering well from recent C-section. She was following up with OB/GYN on a regular basis. She continued to have intermittent carpel tunnel symptoms. Exam revealed a healing surgical incision to her lower abdomen (C-section) with no evidence of infection.  WBC was 12,300 ([facility] 8600).  Hemoglobin 12.2, hematocrit 39.3, MCV 71.2, and platelets 478,000.  B12 was 486.  Folate was 9.3.  Ferritin was low at 26.  She has continued monthly B12 (05/03/2018, 05/24/2018, and 07/19/2018).  CBC on 08/01/2018 revealed a hematocrit of 36.4, hemoglobin 10.9, MCV 69.9, platelets 423,000, WBC 11,200 with an ANC of 6800.  Ferritin was 33.  During the interim, patient is doing better post-partum. Breathing difficulties and heartburn have improved. She still is not sleeping well due to varying sleep wake cycles of her children. She was recently started on doxepin, however she notes that this makes her "groggy" in the mornings. Patient has had lifelong issues with insomnia.   Energy has not really improved. She is unable to determine whether she is due to her lack of sleep, or due to her underlying iron and B12 deficiencies. Patient denies bleeding; no hematochezia, melena, or gross hematuria. Patient denies that she has experienced any B symptoms. She denies any interval infections.   Patient advises that she maintains an adequate appetite. She is eating well. Weight today is 240 lb 13.6 oz (109.2 kg), which compared to her last  visit to the clinic, represents a 9 pound increase. She is still taking "super fruit pre-natal vitamins".   Patient denies pain in the clinic today.   Past Medical History:  Diagnosis Date  . Anemia 2019   iron deficiency and b12 deficiency  . Anginal pain ([place]) 2019   with pregnancy. ekg fine. cardiology reviewed  . Anxiety   . Bipolar disorder ([place])   . Dyspnea    with pregnancy  . Dysrhythmia 2019   tachycardia with pregnancy and panic attacks  . GERD (gastroesophageal reflux disease)   . Hypertension 2019   with pregnancy  . Migraines    has not had as many recently.  at most, once a month  . Panic attacks   . Pica 2019   ice    Past Surgical History:  Procedure Laterality Date  . CESAREAN SECTION    . CESAREAN SECTION Bilateral 04/14/2018   Procedure: REPEAT CESAREAN SECTION WITH BTL;  Surgeon: [name], MD;  Location: [facility] ORS;  Service: Obstetrics;  Laterality: Bilateral;  Birth: 10:[birth year] Sex: Female Weight: 8 lbs 3 oz   . COLPOSCOPY  2006    Family History  Problem Relation Age of Onset  . Cancer Paternal Uncle   . Cancer Maternal Grandfather   . Heart attack Mother 38  . Seizures Father   . Heart attack Paternal Grandfather     Social History:  reports that she has been smoking cigarettes. She has never used smokeless tobacco. She reports that she has current or past drug history. Drug: Marijuana.  She reports that she does not drink alcohol.  Patient is originally from [place], [place]. She lives in [place].  Patient is unemployed. Patient denies known exposures to radiation on toxins.  The patient is alone today.   Allergies:  Allergies  Allergen Reactions  . Aspirin Nausea Only and Other (See Comments)    Makes stomach bleed and vomits blood  . Naproxen Nausea And Vomiting, Nausea Only and Other (See Comments)    Makes stomach bleed. Vomits blood  . Sumatriptan Succinate Other (See Comments)    Tingling over entire body. Painful tingling in  jaw/mouth  . Lactose Intolerance (Gi) Other (See Comments)    Gi upset Can not take whole milk but ice cream and yogurt okay    Current Medications: Current Outpatient Medications  Medication Sig Dispense Refill  . ALPRAZolam (XANAX) 0.25 MG tablet Take 0.25 mg by mouth 3 (three) times daily.    . diphenhydrAMINE-PE-APAP 25-5-325 MG TABS Take 2 tablets by mouth daily as needed (for congestion).    Marland Kitchen doxepin (SINEQUAN) 25 MG capsule Take 25 mg by mouth at bedtime.  1  . lamoTRIgine (LAMICTAL) 100 MG tablet Take 300 mg by mouth daily.  0  . omeprazole (PRILOSEC) 20 MG capsule Take 20 mg by mouth daily.     Marland Kitchen VRAYLAR capsule Take 1.5 mg by mouth daily.  0  . ibuprofen (ADVIL,MOTRIN) 800 MG tablet TAKE 1 TABLET BY MOUTH EVERY 6 TO 8 HOURS AS NEEDED FOR PAIN  0   No current facility-administered medications for this visit.     Review of Systems  Constitutional: Positive for malaise/fatigue. Negative for diaphoresis, fever and weight loss (up 9 pounds).  HENT: Negative.   Eyes: Negative.   Respiratory: Positive for shortness of breath (exertional; improved). Negative for cough, hemoptysis and sputum production.   Cardiovascular: Negative for chest pain, palpitations, orthopnea, leg swelling and PND.  Gastrointestinal: Positive for heartburn (improved). Negative for abdominal pain, blood in stool, constipation, diarrhea, melena, nausea and vomiting.  Genitourinary: Negative for dysuria, frequency, hematuria and urgency.  Musculoskeletal: Positive for joint pain (carpel tunnel symptoms). Negative for back pain, falls and myalgias.  Skin: Negative for itching and rash.  Neurological: Negative for dizziness, tremors, weakness and headaches.  Endo/Heme/Allergies: Does not bruise/bleed easily.  Psychiatric/Behavioral: Negative for depression, memory loss and suicidal ideas. The patient has insomnia. The patient is not nervous/anxious.   All other systems reviewed and are negative.    Performance status (ECOG): 1 - Symptomatic but completely ambulatory  Vital signs BP 116/78 (BP Location: Right Arm, Patient Position: Sitting)   Pulse 82   Temp (!) 97.2 F (36.2 C) (Tympanic)   Resp 18   Wt 240 lb 13.6 oz (109.2 kg)   BMI 41.34 kg/m   Physical Exam  Constitutional: She is oriented to person, place, and time and well-developed, well-nourished, and in no distress. No distress.  HENT:  Head: Normocephalic and atraumatic.  Mouth/Throat: Mucous membranes are normal. No oropharyngeal exudate.  Eyes: Pupils are equal, round, and reactive to light. Conjunctivae and EOM are normal. No scleral icterus.  Neck: Normal range of motion. Neck supple. No JVD present.  Cardiovascular: Normal rate, regular rhythm, normal heart sounds and intact distal pulses. Exam reveals no gallop and no friction rub.  No murmur heard. Pulmonary/Chest: Effort normal and breath sounds normal. No respiratory distress. She has no wheezes. She has no rales.  Abdominal: Soft. Bowel sounds are normal. She exhibits no distension and no mass.  There is no abdominal tenderness. There is no rebound and no guarding.  Musculoskeletal: Normal range of motion. She exhibits no edema or tenderness.  Lymphadenopathy:    She has no cervical adenopathy.  Neurological: She is alert and oriented to person, place, and time.  Skin: Skin is warm and dry. No rash noted. She is not diaphoretic. No erythema.  Psychiatric: Mood, affect and judgment normal.  Nursing note and vitals reviewed.   Appointment on 08/01/2018  Component Date Value Ref Range Status  . Ferritin 08/01/2018 33  11 - 307 ng/mL Final   Performed at [facility], [address] [place], [place] [address]  . WBC 08/01/2018 11.2* 4.0 - 10.5 K/uL Final  . RBC 08/01/2018 5.21* 3.87 - 5.11 MIL/uL Final  . Hemoglobin 08/01/2018 10.9* 12.0 - 15.0 g/dL Final  . HCT 08/01/2018 36.4  36.0 - 46.0 % Final  . MCV 08/01/2018 69.9* 80.0 - 100.0 fL Final   . MCH 08/01/2018 20.9* 26.0 - 34.0 pg Final  . MCHC 08/01/2018 29.9* 30.0 - 36.0 g/dL Final  . RDW 08/01/2018 15.5  11.5 - 15.5 % Final  . Platelets 08/01/2018 423* 150 - 400 K/uL Final  . nRBC 08/01/2018 0.0  0.0 - 0.2 % Final  . Neutrophils Relative % 08/01/2018 60  % Final  . Neutro Abs 08/01/2018 6.8  1.7 - 7.7 K/uL Final  . Lymphocytes Relative 08/01/2018 29  % Final  . Lymphs Abs 08/01/2018 3.3  0.7 - 4.0 K/uL Final  . Monocytes Relative 08/01/2018 8  % Final  . Monocytes Absolute 08/01/2018 0.9  0.1 - 1.0 K/uL Final  . Eosinophils Relative 08/01/2018 2  % Final  . Eosinophils Absolute 08/01/2018 0.2  0.0 - 0.5 K/uL Final  . Basophils Relative 08/01/2018 1  % Final  . Basophils Absolute 08/01/2018 0.1  0.0 - 0.1 K/uL Final  . Immature Granulocytes 08/01/2018 0  % Final  . Abs Immature Granulocytes 08/01/2018 0.04  0.00 - 0.07 K/uL Final   Performed at [facility], [address], [place], [place] [address]    Assessment:  Dominique Dyer is a 37 y.o. female  with iron deficiency anemia.  Diet appears good.  She has ice pica.  She denies any bleeding.  She was on oral iron.  CBC on 10/12/2017 revealed a hematocrit of 32.4, hemoglobin 9.9, and MCV 67.  Ferritin was 13 with an iron saturation of 15% and a TIBC of 440 on 11/09/2017.  Labs on 12/21/2017 revealed a hematocrit of 32.0, hemoglobin 10.2, and MCV 68.1.  Ferritin was 6.  Iron saturation 3% with a TIBC of 517.  Reticulocytes 1%.  B12 level was low at 195.  Folate normal at 13.9.  TSH was 0.893 (normal) and free T4 0.56 (0.61-1.12) on 12/28/2017.  BCR-ABL was negative on 01/25/2018.  Hemoglobin electrophoresis on 02/22/2018 revealed 3.5% Hgb A2 (1.8-3.2%), and Hgb A 96.5%.  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  She underwent planned cesarean section on 04/14/2018.  She received Venofer weekly x 4 (02/08/2018 - 03/01/2018).  Ferritin has been followed:  13 on 11/09/2017, 6 on  12/21/2017, 12 on 01/24/2018, 11 on 02/07/2018, 197 on 03/06/2018, 26 on 05/03/2018, and 33 on 08/01/2018.    She has B12 deficiency.  B12 was 195 on 12/21/2017 and 177 on 01/24/2018.  She was on oral B12.  She began B12 injections on 01/25/2018 (last 010/30/2019).  Folate was 6.3 on 02/22/2018  and 9.3 on 05/03/2018.  Symptomatically, she remains fatigued.  Exertional dyspnea has improved.  Patient continues to struggle with insomnia due to varying sleep-wake cycles of her children.  She was recently started on doxepin, however notes that it makes her feel "groggy".  Energy remains low.  Exam is grossly unremarkable.  WBC 11,200 ([place] 6800).  Hemoglobin 10.9, hematocrit 36.4, MCV 69.9, MCH 20.9, and platelets 423,000.  Ferritin remains low at 33 ng/mL.  Plan: 1. Review labs from 08/01/2018. 2. Iron deficiency anemia  Labs reviewed.  Hemoglobin trending down.   RBC indices microcytic and hypochromic.  Ferritin stable at 33 ng/mL.  Discuss resumption of oral iron, with a source of vitamin C, daily.   Patient encouraged to incorporate more iron rich foods into her diet.  Will continue routine lab monitoring, with additional intravenous iron as needed. 3. B12 deficiency  B12 level had improved to 486 pg/mL when last checked on 05/03/2018.  Patient continues on monthly parenteral B12 supplementation.  Last received B12 injection on 07/19/2018.  Next injection due on 08/16/2018. 4. RTC in 3 months for labs (CBC with differential, ferritin).. 5. RTC in 6 months for MD assessment, labs (CBC with diff, ferritin - day before), B12 injection, and +/- Venofer.    Honor Loh, NP  08/02/2018, 11:18 AM   I saw and evaluated the patient, participating in the key portions of the service and reviewing pertinent diagnostic studies and records.  I reviewed the nurse practitioner's note and agree with the findings and the plan.  The assessment and plan were discussed with the patient.  Several  questions were asked by the patient and answered.   Nolon Stalls, MD 08/02/2018,11:18 AM

## 2018-08-02 NOTE — Progress Notes (Signed)
Here for follow up. Stated energy level is " rock bottom, worst its been for a long time.  No sob walking from lobby "

## 2018-08-16 ENCOUNTER — Inpatient Hospital Stay: Payer: Medicaid Other

## 2018-08-21 ENCOUNTER — Inpatient Hospital Stay: Payer: Medicaid Other | Attending: Hematology and Oncology

## 2018-08-21 DIAGNOSIS — Z79899 Other long term (current) drug therapy: Secondary | ICD-10-CM | POA: Insufficient documentation

## 2018-08-21 DIAGNOSIS — E538 Deficiency of other specified B group vitamins: Secondary | ICD-10-CM | POA: Insufficient documentation

## 2018-08-21 DIAGNOSIS — D509 Iron deficiency anemia, unspecified: Secondary | ICD-10-CM

## 2018-08-21 MED ORDER — CYANOCOBALAMIN 1000 MCG/ML IJ SOLN
1000.0000 ug | Freq: Once | INTRAMUSCULAR | Status: AC
  Administered 2018-08-21: 1000 ug via INTRAMUSCULAR

## 2018-08-23 ENCOUNTER — Inpatient Hospital Stay: Payer: Medicaid Other

## 2018-09-14 ENCOUNTER — Inpatient Hospital Stay: Payer: Medicaid Other

## 2018-09-19 ENCOUNTER — Encounter: Payer: Self-pay | Admitting: Hematology and Oncology

## 2018-10-11 ENCOUNTER — Inpatient Hospital Stay: Payer: Medicaid Other | Attending: Hematology and Oncology

## 2018-10-11 VITALS — BP 109/74 | HR 69 | Temp 97.4°F | Resp 18

## 2018-10-11 DIAGNOSIS — D509 Iron deficiency anemia, unspecified: Secondary | ICD-10-CM

## 2018-10-11 DIAGNOSIS — E538 Deficiency of other specified B group vitamins: Secondary | ICD-10-CM | POA: Insufficient documentation

## 2018-10-11 DIAGNOSIS — Z79899 Other long term (current) drug therapy: Secondary | ICD-10-CM | POA: Diagnosis not present

## 2018-10-11 MED ORDER — CYANOCOBALAMIN 1000 MCG/ML IJ SOLN
1000.0000 ug | Freq: Once | INTRAMUSCULAR | Status: AC
  Administered 2018-10-11: 1000 ug via INTRAMUSCULAR
  Filled 2018-10-11: qty 1

## 2018-10-24 ENCOUNTER — Encounter: Payer: Self-pay | Admitting: Obstetrics and Gynecology

## 2018-10-24 ENCOUNTER — Other Ambulatory Visit (HOSPITAL_COMMUNITY)
Admission: RE | Admit: 2018-10-24 | Discharge: 2018-10-24 | Disposition: A | Discharge status: Home / Self Care | Payer: Medicaid Other | Source: Ambulatory Visit | Attending: Obstetrics and Gynecology | Admitting: Obstetrics and Gynecology

## 2018-10-24 ENCOUNTER — Ambulatory Visit (INDEPENDENT_AMBULATORY_CARE_PROVIDER_SITE_OTHER): Payer: Medicaid Other | Admitting: Obstetrics and Gynecology

## 2018-10-24 VITALS — BP 124/73 | HR 83 | Ht 64.0 in | Wt 250.6 lb

## 2018-10-24 DIAGNOSIS — Z0000 Encounter for general adult medical examination without abnormal findings: Secondary | ICD-10-CM | POA: Insufficient documentation

## 2018-10-24 DIAGNOSIS — N913 Primary oligomenorrhea: Secondary | ICD-10-CM | POA: Insufficient documentation

## 2018-10-24 NOTE — Progress Notes (Signed)
Patient comes in today for her annual physical. She is due for Pap due to abnormal in 09/2017. No others concerns today.

## 2018-10-24 NOTE — Progress Notes (Signed)
HPI:      [name] is a 38 y.o. G2P1001 who LMP was No LMP recorded. (Menstrual status: Other).  Subjective:   She presents today for her annual examination.  She states that she is having irregular cycles and has had them irregularly her whole life.  Her last period was in November.  She had a tubal ligation at the time of cesarean. She has no complaints. Of significant note her last Pap was abnormal.  She states that she previously had a colposcopy but no abnormalities were noted.    Hx: The following portions of the patient's history were reviewed and updated as appropriate:             She  has a past medical history of Anemia (2019), Anginal pain ([place]) (2019), Anxiety, Bipolar disorder ([place]), Dyspnea, Dysrhythmia (2019), GERD (gastroesophageal reflux disease), Hypertension (2019), Migraines, Panic attacks, and Pica (2019). She does not have any pertinent problems on file. She  has a past surgical history that includes Cesarean section; Colposcopy (2006); and Cesarean section (Bilateral, 04/14/2018). Her family history includes Cancer in her maternal grandfather and paternal uncle; Heart attack in her paternal grandfather; Heart attack (age of onset: 17) in her mother; Seizures in her father. She  reports that she has quit smoking. Her smoking use included cigarettes. She has never used smokeless tobacco. She reports previous drug use. Drug: Marijuana. She reports that she does not drink alcohol. She has a current medication list which includes the following prescription(s): alprazolam, diphenhydramine-pe-apap, doxepin, ibuprofen, lamotrigine, omeprazole, and vraylar. She is allergic to aspirin; naproxen; sumatriptan succinate; and lactose intolerance (gi).       Review of Systems:  Review of Systems  Constitutional: Denied constitutional symptoms, night sweats, recent illness, fatigue, fever, insomnia and weight loss.  Eyes: Denied eye symptoms, eye pain, photophobia, vision  change and visual disturbance.  Ears/Nose/Throat/Neck: Denied ear, nose, throat or neck symptoms, hearing loss, nasal discharge, sinus congestion and sore throat.  Cardiovascular: Denied cardiovascular symptoms, arrhythmia, chest pain/pressure, edema, exercise intolerance, orthopnea and palpitations.  Respiratory: Denied pulmonary symptoms, asthma, pleuritic pain, productive sputum, cough, dyspnea and wheezing.  Gastrointestinal: Denied, gastro-esophageal reflux, melena, nausea and vomiting.  Genitourinary: Denied genitourinary symptoms including symptomatic vaginal discharge, pelvic relaxation issues, and urinary complaints.  Musculoskeletal: Denied musculoskeletal symptoms, stiffness, swelling, muscle weakness and myalgia.  Dermatologic: Denied dermatology symptoms, rash and scar.  Neurologic: Denied neurology symptoms, dizziness, headache, neck pain and syncope.  Psychiatric: Denied psychiatric symptoms, anxiety and depression.  Endocrine: Denied endocrine symptoms including hot flashes and night sweats.   Meds:   Current Outpatient Medications on File Prior to Visit  Medication Sig Dispense Refill  . ALPRAZolam (XANAX) 0.25 MG tablet Take 0.25 mg by mouth 3 (three) times daily.    . diphenhydrAMINE-PE-APAP 25-5-325 MG TABS Take 2 tablets by mouth daily as needed (for congestion).    Marland Kitchen doxepin (SINEQUAN) 25 MG capsule Take 25 mg by mouth at bedtime.  1  . ibuprofen (ADVIL,MOTRIN) 800 MG tablet TAKE 1 TABLET BY MOUTH EVERY 6 TO 8 HOURS AS NEEDED FOR PAIN  0  . lamoTRIgine (LAMICTAL) 100 MG tablet Take 300 mg by mouth daily.  0  . omeprazole (PRILOSEC) 20 MG capsule Take 20 mg by mouth daily.     Marland Kitchen VRAYLAR capsule Take 1.5 mg by mouth daily.  0   No current facility-administered medications on file prior to visit.     Objective:     Vitals:  10/24/18 0902  BP: 124/73  Pulse: 83              Physical examination General NAD, Conversant  HEENT Atraumatic; Op clear with mmm.   Normo-cephalic. Pupils reactive. Anicteric sclerae  Thyroid/Neck Smooth without nodularity or enlargement. Normal ROM.  Neck Supple.  Skin No rashes, lesions or ulceration. Normal palpated skin turgor. No nodularity.  Breasts: No masses or discharge.  Symmetric.  No axillary adenopathy.  Lungs: Clear to auscultation.No rales or wheezes. Normal Respiratory effort, no retractions.  Heart: NSR.  No murmurs or rubs appreciated. No periferal edema  Abdomen: Soft.  Non-tender.  No masses.  No HSM. No hernia  Extremities: Moves all appropriately.  Normal ROM for age. No lymphadenopathy.  Neuro: Oriented to PPT.  Normal mood. Normal affect.     Pelvic:   Vulva: Normal appearance.  No lesions.  Vagina: No lesions or abnormalities noted.  Support: Normal pelvic support.  Urethra No masses tenderness or scarring.  Meatus Normal size without lesions or prolapse.  Cervix: Normal appearance.  No lesions.  Anus: Normal exam.  No lesions.  Perineum: Normal exam.  No lesions.        Bimanual   Uterus: Normal size.  Non-tender.  Mobile.  AV.  Adnexae: No masses.  Non-tender to palpation.  Cul-de-sac: Negative for abnormality.     Assessment:    G2P1001 Patient Active Problem List   Diagnosis Date Noted  . Delivery by cesarean section using transverse incision of lower segment of uterus 04/14/2018  . Carpal tunnel syndrome during pregnancy 03/28/2018  . Sinus tachycardia 03/08/2018  . Elevated blood pressure affecting pregnancy in third trimester, antepartum 03/08/2018  . Atypical chest pain 03/08/2018  . Shortness of breath 03/08/2018  . Obesity in pregnancy 03/01/2018  . Leucocytosis 02/08/2018  . B12 deficiency 02/01/2018  . Low vitamin B12 level 12/28/2017  . Goals of care, counseling/discussion 12/28/2017  . Iron deficiency anemia 11/16/2017  . History of anemia 11/09/2017  . Anemia of pregnancy in third trimester 11/09/2017  . H/O cesarean section complicating pregnancy 25/85/2778  .  Marijuana abuse 11/09/2017  . History of pregnancy induced hypertension 11/09/2017  . Advanced maternal age in multigravida 11/09/2017  . Atypical squamous cell changes of undetermined significance (ASCUS) on cervical cytology with positive high risk human papilloma virus (HPV) 11/09/2017     1. Encounter for annual physical exam   2. Primary oligomenorrhea        Plan:            1.  Basic Screening Recommendations The basic screening recommendations for asymptomatic women were discussed with the patient during her visit.  The age-appropriate recommendations were discussed with her and the rational for the tests reviewed.  When I am informed by the patient that another primary care physician has previously obtained the age-appropriate tests and they are up-to-date, only outstanding tests are ordered and referrals given as necessary.  Abnormal results of tests will be discussed with her when all of her results are completed. Pap performed 2.  Discussed irregular cycles and the long-term risk of hyperplasia.  Patient desires IUD.  She will call with her next menses. Orders No orders of the defined types were placed in this encounter.   No orders of the defined types were placed in this encounter.       F/U  Return in about 1 year (around 10/25/2019) for Annual Physical, She is to call at the start of next menses.  Finis Bud, M.D. 10/24/2018 9:44 AM

## 2018-10-24 NOTE — Addendum Note (Signed)
Addended by: Durwin Glaze on: 10/24/2018 10:00 AM   Modules accepted: Orders

## 2018-10-31 LAB — CYTOLOGY - PAP
Diagnosis: NEGATIVE
HPV 16/18/45 GENOTYPING: POSITIVE — AB
HPV: DETECTED — AB

## 2018-11-08 ENCOUNTER — Inpatient Hospital Stay: Payer: Medicaid Other | Attending: Hematology and Oncology

## 2018-11-08 ENCOUNTER — Inpatient Hospital Stay: Payer: Medicaid Other

## 2018-11-08 VITALS — BP 145/85 | HR 79 | Resp 20

## 2018-11-08 DIAGNOSIS — E538 Deficiency of other specified B group vitamins: Secondary | ICD-10-CM | POA: Insufficient documentation

## 2018-11-08 DIAGNOSIS — D509 Iron deficiency anemia, unspecified: Secondary | ICD-10-CM

## 2018-11-08 LAB — CBC WITH DIFFERENTIAL/PLATELET
Abs Immature Granulocytes: 0.04 10*3/uL (ref 0.00–0.07)
Basophils Absolute: 0.1 10*3/uL (ref 0.0–0.1)
Basophils Relative: 1 %
Eosinophils Absolute: 0.1 10*3/uL (ref 0.0–0.5)
Eosinophils Relative: 1 %
HCT: 36.1 % (ref 36.0–46.0)
Hemoglobin: 11.3 g/dL — ABNORMAL LOW (ref 12.0–15.0)
Immature Granulocytes: 0 %
Lymphocytes Relative: 28 %
Lymphs Abs: 3.7 10*3/uL (ref 0.7–4.0)
MCH: 21.5 pg — ABNORMAL LOW (ref 26.0–34.0)
MCHC: 31.3 g/dL (ref 30.0–36.0)
MCV: 68.8 fL — ABNORMAL LOW (ref 80.0–100.0)
Monocytes Absolute: 1 10*3/uL (ref 0.1–1.0)
Monocytes Relative: 7 %
Neutro Abs: 8.4 10*3/uL — ABNORMAL HIGH (ref 1.7–7.7)
Neutrophils Relative %: 63 %
Platelets: 433 10*3/uL — ABNORMAL HIGH (ref 150–400)
RBC: 5.25 MIL/uL — ABNORMAL HIGH (ref 3.87–5.11)
RDW: 16.2 % — ABNORMAL HIGH (ref 11.5–15.5)
WBC: 13.3 10*3/uL — ABNORMAL HIGH (ref 4.0–10.5)
nRBC: 0 % (ref 0.0–0.2)

## 2018-11-08 LAB — FERRITIN: Ferritin: 32 ng/mL (ref 11–307)

## 2018-11-08 MED ORDER — CYANOCOBALAMIN 1000 MCG/ML IJ SOLN
1000.0000 ug | Freq: Once | INTRAMUSCULAR | Status: AC
  Administered 2018-11-08: 1000 ug via INTRAMUSCULAR
  Filled 2018-11-08: qty 1

## 2018-11-08 NOTE — Patient Instructions (Signed)
Cyanocobalamin, Vitamin B12 injection What is this medicine? CYANOCOBALAMIN (sye an oh koe BAL a min) is a man made form of vitamin B12. Vitamin B12 is used in the growth of healthy blood cells, nerve cells, and proteins in the body. It also helps with the metabolism of fats and carbohydrates. This medicine is used to treat people who can not absorb vitamin B12. This medicine may be used for other purposes; ask your health care provider or pharmacist if you have questions. COMMON BRAND NAME(S): B-12 Compliance Kit, B-12 Injection Kit, Cyomin, LA-12, Nutri-Twelve, Physicians EZ Use B-12, Primabalt What should I tell my health care provider before I take this medicine? They need to know if you have any of these conditions: -kidney disease -Leber's disease -megaloblastic anemia -an unusual or allergic reaction to cyanocobalamin, cobalt, other medicines, foods, dyes, or preservatives -pregnant or trying to get pregnant -breast-feeding How should I use this medicine? This medicine is injected into a muscle or deeply under the skin. It is usually given by a health care professional in a clinic or doctor's office. However, your doctor may teach you how to inject yourself. Follow all instructions. Talk to your pediatrician regarding the use of this medicine in children. Special care may be needed. Overdosage: If you think you have taken too much of this medicine contact a poison control center or emergency room at once. NOTE: This medicine is only for you. Do not share this medicine with others. What if I miss a dose? If you are given your dose at a clinic or doctor's office, call to reschedule your appointment. If you give your own injections and you miss a dose, take it as soon as you can. If it is almost time for your next dose, take only that dose. Do not take double or extra doses. What may interact with this medicine? -colchicine -heavy alcohol intake This list may not describe all possible  interactions. Give your health care provider a list of all the medicines, herbs, non-prescription drugs, or dietary supplements you use. Also tell them if you smoke, drink alcohol, or use illegal drugs. Some items may interact with your medicine. What should I watch for while using this medicine? Visit your doctor or health care professional regularly. You may need blood work done while you are taking this medicine. You may need to follow a special diet. Talk to your doctor. Limit your alcohol intake and avoid smoking to get the best benefit. What side effects may I notice from receiving this medicine? Side effects that you should report to your doctor or health care professional as soon as possible: -allergic reactions like skin rash, itching or hives, swelling of the face, lips, or tongue -blue tint to skin -chest tightness, pain -difficulty breathing, wheezing -dizziness -red, swollen painful area on the leg Side effects that usually do not require medical attention (report to your doctor or health care professional if they continue or are bothersome): -diarrhea -headache This list may not describe all possible side effects. Call your doctor for medical advice about side effects. You may report side effects to FDA at 1-800-FDA-1088. Where should I keep my medicine? Keep out of the reach of children. Store at room temperature between 15 and 30 degrees C (59 and 85 degrees F). Protect from light. Throw away any unused medicine after the expiration date. NOTE: This sheet is a summary. It may not cover all possible information. If you have questions about this medicine, talk to your doctor, pharmacist, or   health care provider.  2019 Elsevier/Gold Standard (2007-12-18 22:10:20)  

## 2018-11-17 ENCOUNTER — Telehealth: Payer: Self-pay | Admitting: Obstetrics and Gynecology

## 2018-11-17 NOTE — Telephone Encounter (Signed)
Error

## 2018-11-21 ENCOUNTER — Telehealth: Payer: Self-pay | Admitting: Obstetrics and Gynecology

## 2018-11-21 NOTE — Telephone Encounter (Signed)
Spoke with patient and gave results. 

## 2018-11-21 NOTE — Telephone Encounter (Signed)
The patient returned the call from the nurse.  She is asking for her to call her back about her test results, please advise, thanks.

## 2018-12-06 ENCOUNTER — Inpatient Hospital Stay: Payer: Medicaid Other | Attending: Hematology and Oncology

## 2019-01-03 ENCOUNTER — Inpatient Hospital Stay: Payer: Medicaid Other

## 2019-01-25 ENCOUNTER — Other Ambulatory Visit: Payer: Self-pay | Admitting: Hematology and Oncology

## 2019-01-30 ENCOUNTER — Other Ambulatory Visit: Payer: Medicaid Other

## 2019-01-31 ENCOUNTER — Ambulatory Visit: Payer: Medicaid Other

## 2019-01-31 ENCOUNTER — Inpatient Hospital Stay: Payer: Medicaid Other

## 2019-01-31 ENCOUNTER — Ambulatory Visit: Payer: Medicaid Other | Admitting: Hematology and Oncology

## 2019-02-01 ENCOUNTER — Inpatient Hospital Stay: Payer: Medicaid Other | Attending: Hematology and Oncology

## 2019-02-01 ENCOUNTER — Inpatient Hospital Stay: Payer: Medicaid Other

## 2019-02-01 ENCOUNTER — Other Ambulatory Visit: Payer: Self-pay

## 2019-02-01 VITALS — BP 112/78 | HR 85 | Temp 97.7°F | Resp 18

## 2019-02-01 DIAGNOSIS — Z79899 Other long term (current) drug therapy: Secondary | ICD-10-CM | POA: Diagnosis not present

## 2019-02-01 DIAGNOSIS — Z809 Family history of malignant neoplasm, unspecified: Secondary | ICD-10-CM | POA: Insufficient documentation

## 2019-02-01 DIAGNOSIS — K219 Gastro-esophageal reflux disease without esophagitis: Secondary | ICD-10-CM | POA: Diagnosis not present

## 2019-02-01 DIAGNOSIS — F319 Bipolar disorder, unspecified: Secondary | ICD-10-CM | POA: Diagnosis not present

## 2019-02-01 DIAGNOSIS — D509 Iron deficiency anemia, unspecified: Secondary | ICD-10-CM | POA: Diagnosis present

## 2019-02-01 DIAGNOSIS — G4700 Insomnia, unspecified: Secondary | ICD-10-CM | POA: Insufficient documentation

## 2019-02-01 DIAGNOSIS — R5383 Other fatigue: Secondary | ICD-10-CM | POA: Insufficient documentation

## 2019-02-01 DIAGNOSIS — Z87891 Personal history of nicotine dependence: Secondary | ICD-10-CM | POA: Insufficient documentation

## 2019-02-01 DIAGNOSIS — E538 Deficiency of other specified B group vitamins: Secondary | ICD-10-CM | POA: Insufficient documentation

## 2019-02-01 LAB — CBC WITH DIFFERENTIAL/PLATELET
Abs Immature Granulocytes: 0.02 10*3/uL (ref 0.00–0.07)
Basophils Absolute: 0.1 10*3/uL (ref 0.0–0.1)
Basophils Relative: 0 %
Eosinophils Absolute: 0.1 10*3/uL (ref 0.0–0.5)
Eosinophils Relative: 1 %
HCT: 33.6 % — ABNORMAL LOW (ref 36.0–46.0)
Hemoglobin: 10.4 g/dL — ABNORMAL LOW (ref 12.0–15.0)
Immature Granulocytes: 0 %
Lymphocytes Relative: 41 %
Lymphs Abs: 4.8 10*3/uL — ABNORMAL HIGH (ref 0.7–4.0)
MCH: 21.5 pg — ABNORMAL LOW (ref 26.0–34.0)
MCHC: 31 g/dL (ref 30.0–36.0)
MCV: 69.6 fL — ABNORMAL LOW (ref 80.0–100.0)
Monocytes Absolute: 0.8 10*3/uL (ref 0.1–1.0)
Monocytes Relative: 7 %
Neutro Abs: 5.9 10*3/uL (ref 1.7–7.7)
Neutrophils Relative %: 51 %
Platelets: 379 10*3/uL (ref 150–400)
RBC: 4.83 MIL/uL (ref 3.87–5.11)
RDW: 15.9 % — ABNORMAL HIGH (ref 11.5–15.5)
WBC: 11.7 10*3/uL — ABNORMAL HIGH (ref 4.0–10.5)
nRBC: 0 % (ref 0.0–0.2)

## 2019-02-01 LAB — FERRITIN: Ferritin: 26 ng/mL (ref 11–307)

## 2019-02-01 MED ORDER — CYANOCOBALAMIN 1000 MCG/ML IJ SOLN
1000.0000 ug | Freq: Once | INTRAMUSCULAR | Status: AC
  Administered 2019-02-01: 1000 ug via INTRAMUSCULAR

## 2019-02-06 ENCOUNTER — Inpatient Hospital Stay: Payer: Medicaid Other | Admitting: Hematology and Oncology

## 2019-02-06 NOTE — Progress Notes (Signed)
[facility]  [address], [address] [place], [place] [address] Phone: [phone]  Fax: [phone]   Clinic Day:  02/07/2019  Referring physician: Valera Castle, *  Chief Complaint: Dominique Dyer is a 38 y.o. female with iron deficiency anemia and B12 deficiency who is seen for  6 month assessment.  HPI: The patient was last seen in the hematology clinic on 08/02/2018. At that time, she remained fatigued.  Exertional dyspnea had improved. She continued to struggle with insomnia due to varying sleep-wake cycles of her children. She had recently started doxepin, however it made her feel "groggy".  Energy remained low.  Exam was grossly unremarkable. Hemoglobin was 10.9, hematocrit 36.4. Ferritin was 33.  She saw her gynecologist, Dr. [name], on 10/24/2018. Pap smear was abnormal.  35-month follow-up was scheduled.   She received B12 on 08/21/2018, 10/11/2018, 11/08/2018, and 02/01/2019.  CBCs followed: 11/08/2018: WBC 13,300 (ANC 8,400), hemoglobin 11.3, hematocrit 36.1, platelets 433,000.  Ferritin 32.  02/01/2019: WBC 11,700 (ANC 5,900), hemoglobin 10.4, hematocrit 33.6, platelets 379,000.  Ferritin 26.   During the interim, she feels "fine."  She notes constant fatigue.  She denies shortness of breath. She has difficulty sleeping and wakes up at 2:30-3am every night. Doxepin has not provided improvement. She has noticed mild improvement in her fatigue following her B12 injection.   She has occasional heartburn. Carpal tunnel symptoms still present.    Past Medical History:  Diagnosis Date  . Anemia 2019   iron deficiency and b12 deficiency  . Anginal pain ([place]) 2019   with pregnancy. ekg fine. cardiology reviewed  . Anxiety   . Bipolar disorder ([place])   . Dyspnea    with pregnancy  . Dysrhythmia 2019   tachycardia with pregnancy and panic attacks  . GERD (gastroesophageal reflux disease)   . Hypertension 2019   with pregnancy   . Migraines    has not had as many recently.  at most, once a month  . Panic attacks   . Pica 2019   ice    Past Surgical History:  Procedure Laterality Date  . CESAREAN SECTION    . CESAREAN SECTION Bilateral 04/14/2018   Procedure: REPEAT CESAREAN SECTION WITH BTL;  Surgeon: [name], MD;  Location: [facility] ORS;  Service: Obstetrics;  Laterality: Bilateral;  Birth: 10[birth year] Sex: Female Weight: 8 lbs 3 oz   . COLPOSCOPY  2006    Family History  Problem Relation Age of Onset  . Cancer Paternal Uncle   . Cancer Maternal Grandfather   . Heart attack Mother 58  . Seizures Father   . Heart attack Paternal Grandfather     Social History:  reports that she has quit smoking. Her smoking use included cigarettes. She has never used smokeless tobacco. She reports previous drug use. Drug: Marijuana. She reports that she does not drink alcohol. Patient is originally from [place], [place]. She lives in [place].  Patient is unemployed. Patient denies known exposures to radiation on toxins.  The patient is alone today.   Allergies:  Allergies  Allergen Reactions  . Aspirin Nausea Only and Other (See Comments)    Makes stomach bleed and vomits blood  . Naproxen Nausea And Vomiting, Nausea Only and Other (See Comments)    Makes stomach bleed. Vomits blood  . Sumatriptan Succinate Other (See Comments)    Tingling over entire body. Painful tingling in jaw/mouth  . Lactose Intolerance (Gi) Other (See Comments)  Gi upset Can not take whole milk but ice cream and yogurt okay    Current Medications: Current Outpatient Medications  Medication Sig Dispense Refill  . ALPRAZolam (XANAX) 0.25 MG tablet Take 0.25 mg by mouth 3 (three) times daily.    Marland Kitchen doxepin (SINEQUAN) 25 MG capsule Take 25 mg by mouth at bedtime.  1  . lamoTRIgine (LAMICTAL) 100 MG tablet Take 300 mg by mouth daily.  0  . omeprazole (PRILOSEC) 20 MG capsule Take 20 mg by mouth daily.     Marland Kitchen VRAYLAR capsule Take 1.5 mg  by mouth daily.  0  . diphenhydrAMINE-PE-APAP 25-5-325 MG TABS Take 2 tablets by mouth daily as needed (for congestion).    Marland Kitchen ibuprofen (ADVIL,MOTRIN) 800 MG tablet TAKE 1 TABLET BY MOUTH EVERY 6 TO 8 HOURS AS NEEDED FOR PAIN  0   No current facility-administered medications for this visit.     Review of Systems  Constitutional: Positive for malaise/fatigue. Negative for chills, diaphoresis, fever and weight loss (up 6 pounds).       Feels tired.  HENT: Negative.  Negative for congestion, ear pain, nosebleeds, sinus pain and sore throat.   Eyes: Negative.  Negative for blurred vision, double vision and pain.  Respiratory: Negative.  Negative for cough, hemoptysis, sputum production and shortness of breath.   Cardiovascular: Negative.  Negative for chest pain, palpitations, orthopnea, leg swelling and PND.  Gastrointestinal: Positive for heartburn (improved). Negative for abdominal pain, blood in stool, constipation, diarrhea, melena, nausea and vomiting.  Genitourinary: Negative for dysuria, frequency, hematuria and urgency.  Musculoskeletal: Negative for back pain, falls, joint pain and myalgias.  Skin: Negative.  Negative for itching and rash.  Neurological: Negative for dizziness, tremors, focal weakness, weakness and headaches.       Carpal tunnel.  Endo/Heme/Allergies: Does not bruise/bleed easily.  Psychiatric/Behavioral: Negative for depression, memory loss and suicidal ideas. The patient has insomnia. The patient is not nervous/anxious.   All other systems reviewed and are negative.  Performance status (ECOG):  1  Physical Exam  Constitutional: She is oriented to person, place, and time. She appears well-developed and well-nourished. No distress.  HENT:  Head: Normocephalic and atraumatic.  Mouth/Throat: Oropharynx is clear and moist. No oropharyngeal exudate.  Dark hair.  Wearing a mask.  Eyes: Pupils are equal, round, and reactive to light. Conjunctivae and EOM are normal.  No scleral icterus.  Neck: Normal range of motion. Neck supple. No JVD present.  Cardiovascular: Normal rate, regular rhythm and normal heart sounds.  No murmur heard. Pulmonary/Chest: Effort normal and breath sounds normal. No respiratory distress. She has no wheezes. She has no rales.  Abdominal: Soft. Bowel sounds are normal. She exhibits no distension and no mass. There is no abdominal tenderness. There is no rebound and no guarding.  Musculoskeletal: Normal range of motion.        General: No edema.  Lymphadenopathy:    She has no cervical adenopathy.    She has no axillary adenopathy.       Right: No supraclavicular adenopathy present.       Left: No supraclavicular adenopathy present.  Neurological: She is alert and oriented to person, place, and time.  Skin: Skin is warm and dry. No rash noted. She is not diaphoretic. No erythema. No pallor.  Warts on thumbs.  Psychiatric: She has a normal mood and affect. Her behavior is normal. Judgment and thought content normal.  Nursing note and vitals reviewed.   No visits with  results within 3 Day(s) from this visit.  Latest known visit with results is:  Appointment on 02/01/2019  Component Date Value Ref Range Status  . Ferritin 02/01/2019 26  11 - 307 ng/mL Final   Performed at [facility], [address] [place], [place] [address]  . WBC 02/01/2019 11.7* 4.0 - 10.5 K/uL Final  . RBC 02/01/2019 4.83  3.87 - 5.11 MIL/uL Final  . Hemoglobin 02/01/2019 10.4* 12.0 - 15.0 g/dL Final  . HCT 02/01/2019 33.6* 36.0 - 46.0 % Final  . MCV 02/01/2019 69.6* 80.0 - 100.0 fL Final  . MCH 02/01/2019 21.5* 26.0 - 34.0 pg Final  . MCHC 02/01/2019 31.0  30.0 - 36.0 g/dL Final  . RDW 02/01/2019 15.9* 11.5 - 15.5 % Final  . Platelets 02/01/2019 379  150 - 400 K/uL Final  . nRBC 02/01/2019 0.0  0.0 - 0.2 % Final  . Neutrophils Relative % 02/01/2019 51  % Final  . Neutro Abs 02/01/2019 5.9  1.7 - 7.7 K/uL Final  . Lymphocytes Relative  02/01/2019 41  % Final  . Lymphs Abs 02/01/2019 4.8* 0.7 - 4.0 K/uL Final  . Monocytes Relative 02/01/2019 7  % Final  . Monocytes Absolute 02/01/2019 0.8  0.1 - 1.0 K/uL Final  . Eosinophils Relative 02/01/2019 1  % Final  . Eosinophils Absolute 02/01/2019 0.1  0.0 - 0.5 K/uL Final  . Basophils Relative 02/01/2019 0  % Final  . Basophils Absolute 02/01/2019 0.1  0.0 - 0.1 K/uL Final  . Immature Granulocytes 02/01/2019 0  % Final  . Abs Immature Granulocytes 02/01/2019 0.02  0.00 - 0.07 K/uL Final   Performed at [facility], [address] [place], [place] [address]    Assessment:  Dominique Dyer is a 38 y.o. female with iron deficiency anemia.  Diet appears good.  She has ice pica.  She denies any bleeding.  She was on oral iron.  CBC on 10/12/2017 revealed a hematocrit of 32.4, hemoglobin 9.9, and MCV 67.  Ferritin was 13 with an iron saturation of 15% and a TIBC of 440 on 11/09/2017.  Labs on 12/21/2017 revealed a hematocrit of 32.0, hemoglobin 10.2, and MCV 68.1.  Ferritin was 6.  Iron saturation 3% with a TIBC of 517.  Reticulocytes 1%.  B12 level was low at 195.  Folate normal at 13.9.  TSH was 0.893 (normal) and free T4 0.56 (0.61-1.12) on 12/28/2017.  BCR-ABL was negative on 01/25/2018.  Hemoglobin electrophoresis on 02/22/2018 revealed 3.5% Hgb A2 (1.8-3.2%), and Hgb A 96.5%.  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  She underwent planned cesarean section on 04/14/2018.  She received Venofer weekly x 4 (02/08/2018 - 03/01/2018).  Ferritin has been followed:  13 on 11/09/2017, 6 on 12/21/2017, 12 on 01/24/2018, 11 on 02/07/2018, 197 on 03/06/2018, 26 on 05/03/2018, 33 on 08/01/2018, 32 on 11/08/2018, and 26 on 02/01/2019.  She has B12 deficiency.  B12 was 195 on 12/21/2017 and 177 on 01/24/2018.  She was on oral B12.  She began B12 injections on 01/25/2018 (last 02/01/2019).  Folate was 6.3 on 02/22/2018 and 9.3 on 05/03/2018.   Symptomatically, she is fatigued.    Plan: 1. Review labs from 02/01/2019. 2. Iron deficiency anemia Hemoglobin is 10.4.  MCV is 69.6. Ferritin is 26.   Ferritin goal is 100.  Review plan for IV iron if ferritin < 30 or symptomatic.  Patient consents to IV iron.  Urine pregnancy  test as patient is premenopausal. Venofer today and weekly x 2. 3. B12 deficiency Patient receives B12 monthly (last 02/01/2019). Schedule monthly B12 x 6. 4.   RTC in 3 months for MD assessment, labs (CBC with diff, ferritin-day before), and +/- Venofer.  I discussed the assessment and treatment plan with the patient.  The patient was provided an opportunity to ask questions and all were answered.  The patient agreed with the plan and demonstrated an understanding of the instructions.  The patient was advised to call back if the symptoms worsen or if the condition fails to improve as anticipated.  I provided 15 minutes of face-to-face time during this this encounter and > 50% was spent counseling as documented under my assessment and plan.    Lequita Asal, MD, PhD    02/07/2019, 10:33 AM  I, Molly Dorshimer, am acting as Education administrator for [employer]. Mike Gip, MD, PhD.  I, Melissa C. Mike Gip, MD, have reviewed the above documentation for accuracy and completeness, and I agree with the above.

## 2019-02-07 ENCOUNTER — Other Ambulatory Visit: Payer: Self-pay

## 2019-02-07 ENCOUNTER — Inpatient Hospital Stay (HOSPITAL_BASED_OUTPATIENT_CLINIC_OR_DEPARTMENT_OTHER): Payer: Medicaid Other | Admitting: Hematology and Oncology

## 2019-02-07 ENCOUNTER — Encounter: Payer: Self-pay | Admitting: Hematology and Oncology

## 2019-02-07 ENCOUNTER — Inpatient Hospital Stay: Payer: Medicaid Other

## 2019-02-07 VITALS — BP 113/78 | HR 85 | Temp 97.2°F | Resp 18 | Ht 64.0 in | Wt 256.6 lb

## 2019-02-07 VITALS — BP 115/78 | HR 79 | Resp 18

## 2019-02-07 DIAGNOSIS — Z79899 Other long term (current) drug therapy: Secondary | ICD-10-CM

## 2019-02-07 DIAGNOSIS — Z87891 Personal history of nicotine dependence: Secondary | ICD-10-CM

## 2019-02-07 DIAGNOSIS — K219 Gastro-esophageal reflux disease without esophagitis: Secondary | ICD-10-CM

## 2019-02-07 DIAGNOSIS — D509 Iron deficiency anemia, unspecified: Secondary | ICD-10-CM

## 2019-02-07 DIAGNOSIS — Z809 Family history of malignant neoplasm, unspecified: Secondary | ICD-10-CM

## 2019-02-07 DIAGNOSIS — R5383 Other fatigue: Secondary | ICD-10-CM | POA: Diagnosis not present

## 2019-02-07 DIAGNOSIS — F319 Bipolar disorder, unspecified: Secondary | ICD-10-CM

## 2019-02-07 DIAGNOSIS — E538 Deficiency of other specified B group vitamins: Secondary | ICD-10-CM | POA: Diagnosis not present

## 2019-02-07 DIAGNOSIS — G4700 Insomnia, unspecified: Secondary | ICD-10-CM

## 2019-02-07 LAB — PREGNANCY, URINE: Preg Test, Ur: NEGATIVE

## 2019-02-07 MED ORDER — IRON SUCROSE 20 MG/ML IV SOLN
200.0000 mg | Freq: Once | INTRAVENOUS | Status: AC
  Administered 2019-02-07: 11:00:00 200 mg via INTRAVENOUS
  Filled 2019-02-07: qty 10

## 2019-02-07 MED ORDER — SODIUM CHLORIDE 0.9 % IV SOLN
Freq: Once | INTRAVENOUS | Status: AC
  Administered 2019-02-07: 11:00:00 via INTRAVENOUS
  Filled 2019-02-07: qty 250

## 2019-02-07 MED ORDER — SODIUM CHLORIDE 0.9 % IV SOLN: 200.0000 mg | Freq: Once | INTRAVENOUS | Status: DC

## 2019-02-07 NOTE — Patient Instructions (Signed)
Iron Sucrose injection What is this medicine? IRON SUCROSE (AHY ern SOO krohs) is an iron complex. Iron is used to make healthy red blood cells, which carry oxygen and nutrients throughout the body. This medicine is used to treat iron deficiency anemia in people with chronic kidney disease. This medicine may be used for other purposes; ask your health care provider or pharmacist if you have questions. COMMON BRAND NAME(S): Venofer What should I tell my health care provider before I take this medicine? They need to know if you have any of these conditions: -anemia not caused by low iron levels -heart disease -high levels of iron in the blood -kidney disease -liver disease -an unusual or allergic reaction to iron, other medicines, foods, dyes, or preservatives -pregnant or trying to get pregnant -breast-feeding How should I use this medicine? This medicine is for infusion into a vein. It is given by a health care professional in a hospital or clinic setting. Talk to your pediatrician regarding the use of this medicine in children. While this drug may be prescribed for children as young as 2 years for selected conditions, precautions do apply. Overdosage: If you think you have taken too much of this medicine contact a poison control center or emergency room at once. NOTE: This medicine is only for you. Do not share this medicine with others. What if I miss a dose? It is important not to miss your dose. Call your doctor or health care professional if you are unable to keep an appointment. What may interact with this medicine? Do not take this medicine with any of the following medications: -deferoxamine -dimercaprol -other iron products This medicine may also interact with the following medications: -chloramphenicol -deferasirox This list may not describe all possible interactions. Give your health care provider a list of all the medicines, herbs, non-prescription drugs, or dietary  supplements you use. Also tell them if you smoke, drink alcohol, or use illegal drugs. Some items may interact with your medicine. What should I watch for while using this medicine? Visit your doctor or healthcare professional regularly. Tell your doctor or healthcare professional if your symptoms do not start to get better or if they get worse. You may need blood work done while you are taking this medicine. You may need to follow a special diet. Talk to your doctor. Foods that contain iron include: whole grains/cereals, dried fruits, beans, or peas, leafy green vegetables, and organ meats (liver, kidney). What side effects may I notice from receiving this medicine? Side effects that you should report to your doctor or health care professional as soon as possible: -allergic reactions like skin rash, itching or hives, swelling of the face, lips, or tongue -breathing problems -changes in blood pressure -cough -fast, irregular heartbeat -feeling faint or lightheaded, falls -fever or chills -flushing, sweating, or hot feelings -joint or muscle aches/pains -seizures -swelling of the ankles or feet -unusually weak or tired Side effects that usually do not require medical attention (report to your doctor or health care professional if they continue or are bothersome): -diarrhea -feeling achy -headache -irritation at site where injected -nausea, vomiting -stomach upset -tiredness This list may not describe all possible side effects. Call your doctor for medical advice about side effects. You may report side effects to FDA at 1-800-FDA-1088. Where should I keep my medicine? This drug is given in a hospital or clinic and will not be stored at home. NOTE: This sheet is a summary. It may not cover all possible information. If   you have questions about this medicine, talk to your doctor, pharmacist, or health care provider.  2019 Elsevier/Gold Standard (2011-06-17 17:14:35)  

## 2019-02-07 NOTE — Progress Notes (Signed)
No new changes noted today 

## 2019-02-13 ENCOUNTER — Inpatient Hospital Stay: Payer: Medicaid Other

## 2019-02-20 ENCOUNTER — Inpatient Hospital Stay: Payer: Medicaid Other

## 2019-02-21 ENCOUNTER — Other Ambulatory Visit: Payer: Self-pay

## 2019-02-21 ENCOUNTER — Inpatient Hospital Stay: Payer: Medicaid Other | Attending: Hematology and Oncology

## 2019-02-21 VITALS — BP 102/62 | HR 78 | Resp 18

## 2019-02-21 DIAGNOSIS — D509 Iron deficiency anemia, unspecified: Secondary | ICD-10-CM | POA: Diagnosis present

## 2019-02-21 DIAGNOSIS — Z79899 Other long term (current) drug therapy: Secondary | ICD-10-CM | POA: Diagnosis not present

## 2019-02-21 MED ORDER — SODIUM CHLORIDE 0.9 % IV SOLN
Freq: Once | INTRAVENOUS | Status: AC
  Administered 2019-02-21: 11:00:00 via INTRAVENOUS
  Filled 2019-02-21: qty 250

## 2019-02-21 MED ORDER — SODIUM CHLORIDE 0.9 % IV SOLN: 200.0000 mg | Freq: Once | INTRAVENOUS | Status: DC

## 2019-02-21 MED ORDER — IRON SUCROSE 20 MG/ML IV SOLN
200.0000 mg | Freq: Once | INTRAVENOUS | Status: AC
  Administered 2019-02-21: 200 mg via INTRAVENOUS
  Filled 2019-02-21: qty 10

## 2019-02-22 ENCOUNTER — Inpatient Hospital Stay: Payer: Medicaid Other

## 2019-02-28 ENCOUNTER — Inpatient Hospital Stay: Payer: Medicaid Other | Attending: Hematology and Oncology

## 2019-02-28 ENCOUNTER — Other Ambulatory Visit: Payer: Self-pay

## 2019-02-28 VITALS — BP 110/74 | HR 75 | Temp 98.4°F | Resp 17

## 2019-02-28 DIAGNOSIS — Z79899 Other long term (current) drug therapy: Secondary | ICD-10-CM | POA: Diagnosis not present

## 2019-02-28 DIAGNOSIS — E538 Deficiency of other specified B group vitamins: Secondary | ICD-10-CM | POA: Insufficient documentation

## 2019-02-28 DIAGNOSIS — D509 Iron deficiency anemia, unspecified: Secondary | ICD-10-CM | POA: Insufficient documentation

## 2019-02-28 MED ORDER — CYANOCOBALAMIN 1000 MCG/ML IJ SOLN
1000.0000 ug | Freq: Once | INTRAMUSCULAR | Status: AC
  Administered 2019-02-28: 1000 ug via INTRAMUSCULAR
  Filled 2019-02-28: qty 1

## 2019-02-28 MED ORDER — SODIUM CHLORIDE 0.9 % IV SOLN
Freq: Once | INTRAVENOUS | Status: AC
  Administered 2019-02-28: 11:00:00 via INTRAVENOUS
  Filled 2019-02-28: qty 250

## 2019-02-28 MED ORDER — IRON SUCROSE 20 MG/ML IV SOLN
200.0000 mg | Freq: Once | INTRAVENOUS | Status: AC
  Administered 2019-02-28: 200 mg via INTRAVENOUS
  Filled 2019-02-28: qty 10

## 2019-02-28 NOTE — Patient Instructions (Signed)
Iron Sucrose injection What is this medicine? IRON SUCROSE (AHY ern SOO krohs) is an iron complex. Iron is used to make healthy red blood cells, which carry oxygen and nutrients throughout the body. This medicine is used to treat iron deficiency anemia in people with chronic kidney disease. This medicine may be used for other purposes; ask your health care provider or pharmacist if you have questions. COMMON BRAND NAME(S): Venofer What should I tell my health care provider before I take this medicine? They need to know if you have any of these conditions: -anemia not caused by low iron levels -heart disease -high levels of iron in the blood -kidney disease -liver disease -an unusual or allergic reaction to iron, other medicines, foods, dyes, or preservatives -pregnant or trying to get pregnant -breast-feeding How should I use this medicine? This medicine is for infusion into a vein. It is given by a health care professional in a hospital or clinic setting. Talk to your pediatrician regarding the use of this medicine in children. While this drug may be prescribed for children as young as 2 years for selected conditions, precautions do apply. Overdosage: If you think you have taken too much of this medicine contact a poison control center or emergency room at once. NOTE: This medicine is only for you. Do not share this medicine with others. What if I miss a dose? It is important not to miss your dose. Call your doctor or health care professional if you are unable to keep an appointment. What may interact with this medicine? Do not take this medicine with any of the following medications: -deferoxamine -dimercaprol -other iron products This medicine may also interact with the following medications: -chloramphenicol -deferasirox This list may not describe all possible interactions. Give your health care provider a list of all the medicines, herbs, non-prescription drugs, or dietary  supplements you use. Also tell them if you smoke, drink alcohol, or use illegal drugs. Some items may interact with your medicine. What should I watch for while using this medicine? Visit your doctor or healthcare professional regularly. Tell your doctor or healthcare professional if your symptoms do not start to get better or if they get worse. You may need blood work done while you are taking this medicine. You may need to follow a special diet. Talk to your doctor. Foods that contain iron include: whole grains/cereals, dried fruits, beans, or peas, leafy green vegetables, and organ meats (liver, kidney). What side effects may I notice from receiving this medicine? Side effects that you should report to your doctor or health care professional as soon as possible: -allergic reactions like skin rash, itching or hives, swelling of the face, lips, or tongue -breathing problems -changes in blood pressure -cough -fast, irregular heartbeat -feeling faint or lightheaded, falls -fever or chills -flushing, sweating, or hot feelings -joint or muscle aches/pains -seizures -swelling of the ankles or feet -unusually weak or tired Side effects that usually do not require medical attention (report to your doctor or health care professional if they continue or are bothersome): -diarrhea -feeling achy -headache -irritation at site where injected -nausea, vomiting -stomach upset -tiredness This list may not describe all possible side effects. Call your doctor for medical advice about side effects. You may report side effects to FDA at 1-800-FDA-1088. Where should I keep my medicine? This drug is given in a hospital or clinic and will not be stored at home. NOTE: This sheet is a summary. It may not cover all possible information. If   you have questions about this medicine, talk to your doctor, pharmacist, or health care provider.  2019 Elsevier/Gold Standard (2011-06-17 17:14:35)  

## 2019-03-01 ENCOUNTER — Inpatient Hospital Stay: Payer: Medicaid Other

## 2019-03-27 ENCOUNTER — Ambulatory Visit: Payer: Self-pay | Admitting: Obstetrics and Gynecology

## 2019-03-29 ENCOUNTER — Inpatient Hospital Stay: Payer: Medicaid Other

## 2019-04-11 ENCOUNTER — Inpatient Hospital Stay: Payer: Medicaid Other | Attending: Hematology and Oncology

## 2019-04-11 ENCOUNTER — Other Ambulatory Visit: Payer: Self-pay

## 2019-04-11 VITALS — BP 120/66 | HR 90 | Temp 98.0°F | Resp 18

## 2019-04-11 DIAGNOSIS — D509 Iron deficiency anemia, unspecified: Secondary | ICD-10-CM | POA: Insufficient documentation

## 2019-04-11 DIAGNOSIS — E538 Deficiency of other specified B group vitamins: Secondary | ICD-10-CM | POA: Insufficient documentation

## 2019-04-11 MED ORDER — CYANOCOBALAMIN 1000 MCG/ML IJ SOLN
1000.0000 ug | Freq: Once | INTRAMUSCULAR | Status: AC
  Administered 2019-04-11: 1000 ug via INTRAMUSCULAR

## 2019-04-19 DIAGNOSIS — M5481 Occipital neuralgia: Secondary | ICD-10-CM | POA: Insufficient documentation

## 2019-04-26 ENCOUNTER — Ambulatory Visit: Payer: Medicaid Other

## 2019-05-09 ENCOUNTER — Inpatient Hospital Stay: Payer: Medicaid Other | Attending: Hematology and Oncology

## 2019-05-09 ENCOUNTER — Other Ambulatory Visit: Payer: Self-pay

## 2019-05-09 DIAGNOSIS — E538 Deficiency of other specified B group vitamins: Secondary | ICD-10-CM | POA: Insufficient documentation

## 2019-05-09 DIAGNOSIS — D509 Iron deficiency anemia, unspecified: Secondary | ICD-10-CM

## 2019-05-09 MED ORDER — CYANOCOBALAMIN 1000 MCG/ML IJ SOLN
1000.0000 ug | Freq: Once | INTRAMUSCULAR | Status: AC
  Administered 2019-05-09: 14:00:00 1000 ug via INTRAMUSCULAR

## 2019-05-23 ENCOUNTER — Other Ambulatory Visit: Payer: Medicaid Other

## 2019-05-24 ENCOUNTER — Ambulatory Visit: Payer: Medicaid Other | Admitting: Hematology and Oncology

## 2019-05-24 ENCOUNTER — Ambulatory Visit: Payer: Medicaid Other

## 2019-06-05 ENCOUNTER — Other Ambulatory Visit: Payer: Medicaid Other

## 2019-06-06 ENCOUNTER — Ambulatory Visit: Payer: Medicaid Other | Admitting: Hematology and Oncology

## 2019-06-06 ENCOUNTER — Inpatient Hospital Stay: Payer: Medicaid Other | Attending: Hematology and Oncology

## 2019-06-06 ENCOUNTER — Other Ambulatory Visit: Payer: Self-pay

## 2019-06-06 ENCOUNTER — Ambulatory Visit: Payer: Medicaid Other

## 2019-06-06 DIAGNOSIS — I10 Essential (primary) hypertension: Secondary | ICD-10-CM | POA: Diagnosis not present

## 2019-06-06 DIAGNOSIS — D509 Iron deficiency anemia, unspecified: Secondary | ICD-10-CM | POA: Insufficient documentation

## 2019-06-06 DIAGNOSIS — Z87891 Personal history of nicotine dependence: Secondary | ICD-10-CM | POA: Diagnosis not present

## 2019-06-06 DIAGNOSIS — G4700 Insomnia, unspecified: Secondary | ICD-10-CM | POA: Diagnosis not present

## 2019-06-06 DIAGNOSIS — F319 Bipolar disorder, unspecified: Secondary | ICD-10-CM | POA: Diagnosis not present

## 2019-06-06 DIAGNOSIS — Z79899 Other long term (current) drug therapy: Secondary | ICD-10-CM | POA: Insufficient documentation

## 2019-06-06 DIAGNOSIS — R12 Heartburn: Secondary | ICD-10-CM | POA: Insufficient documentation

## 2019-06-06 DIAGNOSIS — E538 Deficiency of other specified B group vitamins: Secondary | ICD-10-CM | POA: Diagnosis not present

## 2019-06-06 DIAGNOSIS — Z9851 Tubal ligation status: Secondary | ICD-10-CM | POA: Insufficient documentation

## 2019-06-06 LAB — CBC WITH DIFFERENTIAL/PLATELET
Abs Immature Granulocytes: 0.03 10*3/uL (ref 0.00–0.07)
Basophils Absolute: 0.1 10*3/uL (ref 0.0–0.1)
Basophils Relative: 1 %
Eosinophils Absolute: 0.2 10*3/uL (ref 0.0–0.5)
Eosinophils Relative: 1 %
HCT: 38.2 % (ref 36.0–46.0)
Hemoglobin: 11.7 g/dL — ABNORMAL LOW (ref 12.0–15.0)
Immature Granulocytes: 0 %
Lymphocytes Relative: 24 %
Lymphs Abs: 2.7 10*3/uL (ref 0.7–4.0)
MCH: 21.4 pg — ABNORMAL LOW (ref 26.0–34.0)
MCHC: 30.6 g/dL (ref 30.0–36.0)
MCV: 70 fL — ABNORMAL LOW (ref 80.0–100.0)
Monocytes Absolute: 0.8 10*3/uL (ref 0.1–1.0)
Monocytes Relative: 7 %
Neutro Abs: 7.4 10*3/uL (ref 1.7–7.7)
Neutrophils Relative %: 67 %
Platelets: 368 10*3/uL (ref 150–400)
RBC: 5.46 MIL/uL — ABNORMAL HIGH (ref 3.87–5.11)
RDW: 15.2 % (ref 11.5–15.5)
WBC: 11.1 10*3/uL — ABNORMAL HIGH (ref 4.0–10.5)
nRBC: 0 % (ref 0.0–0.2)

## 2019-06-06 LAB — FERRITIN: Ferritin: 55 ng/mL (ref 11–307)

## 2019-06-11 ENCOUNTER — Inpatient Hospital Stay (HOSPITAL_BASED_OUTPATIENT_CLINIC_OR_DEPARTMENT_OTHER): Payer: Medicaid Other | Admitting: Nurse Practitioner

## 2019-06-11 ENCOUNTER — Encounter: Payer: Self-pay | Admitting: Nurse Practitioner

## 2019-06-11 ENCOUNTER — Inpatient Hospital Stay: Payer: Medicaid Other

## 2019-06-11 ENCOUNTER — Other Ambulatory Visit: Payer: Self-pay

## 2019-06-11 VITALS — HR 1 | Temp 96.1°F | Resp 18 | Ht 64.0 in | Wt 235.2 lb

## 2019-06-11 DIAGNOSIS — D509 Iron deficiency anemia, unspecified: Secondary | ICD-10-CM

## 2019-06-11 DIAGNOSIS — E538 Deficiency of other specified B group vitamins: Secondary | ICD-10-CM

## 2019-06-11 MED ORDER — CYANOCOBALAMIN 1000 MCG/ML IJ SOLN
1000.0000 ug | Freq: Once | INTRAMUSCULAR | Status: AC
  Administered 2019-06-11: 1000 ug via INTRAMUSCULAR
  Filled 2019-06-11: qty 1

## 2019-06-11 NOTE — Progress Notes (Signed)
No new changes noted today 

## 2019-06-11 NOTE — Progress Notes (Signed)
[facility]  [address], [address] [place],  [address] Phone: [phone]  Fax: [phone]   Clinic Day:  06/11/2019  Referring physician: Valera Castle, *  Chief Complaint: Dominique Dyer is a 38 y.o. female with iron deficiency anemia and B12 deficiency who returns to clinic for 30-month assessment.    HPI: Patient last seen in clinic by Dr. Mike Gip on 02/07/2019.  At that time, she reported continued fatigue and difficulty sleeping refractory to doxepin.  Fatigue was somewhat improved following injections of B12.  Occasional heartburn.  Chronic carpal tunnel symptoms.  Exam was grossly unremarkable.  Hemoglobin was 10.4, hematocrit 33.6, ferritin 26.  In the interim, she received 3 doses of Venofer (02/07/2019, 02/21/2019, 02/28/2019).  She is received monthly B12 injections (02/28/2019, 04/11/2019, 05/09/2019)  Today, she reports continued fatigue and says she is chasing multiple children at home.  Continues to battle insomnia.  She continues to have heavy menstrual.'s.  She had an abnormal Pap smear on 10/24/2018 that was reportedly and ILM and positive for HPV 16 (high risk).  She has previously had abnormal Pap smears.  History of bilateral tubal ligation (04/14/2018).  She has tolerated iron infusions well in the past denies significant side effects.   Past Medical History:  Diagnosis Date  . Anemia 2019   iron deficiency and b12 deficiency  . Anginal pain ([place]) 2019   with pregnancy. ekg fine. cardiology reviewed  . Anxiety   . Bipolar disorder ([place])   . Dyspnea    with pregnancy  . Dysrhythmia 2019   tachycardia with pregnancy and panic attacks  . GERD (gastroesophageal reflux disease)   . Hypertension 2019   with pregnancy  . Migraines    has not had as many recently.  at most, once a month  . Panic attacks   . Pica 2019   ice    Past Surgical History:  Procedure Laterality Date  . CESAREAN SECTION    . CESAREAN SECTION  Bilateral 04/14/2018   Procedure: REPEAT CESAREAN SECTION WITH BTL;  Surgeon: [name], MD;  Location: [facility] ORS;  Service: Obstetrics;  Laterality: Bilateral;  Birth: 10:45 Sex: Female Weight: 8 lbs 3 oz   . COLPOSCOPY  2006    Family History  Problem Relation Age of Onset  . Cancer Paternal Uncle   . Cancer Maternal Grandfather   . Heart attack Mother 60  . Seizures Father   . Heart attack Paternal Grandfather     Social History:  reports that she has quit smoking. Her smoking use included cigarettes. She has never used smokeless tobacco. She reports previous drug use. Drug: Marijuana. She reports that she does not drink alcohol. Patient is originally from [place], [place]. She lives in [place].  Patient is unemployed. Patient denies known exposures to radiation on toxins.  The patient is alone today.   Allergies:  Allergies  Allergen Reactions  . Aspirin Nausea Only and Other (See Comments)    Makes stomach bleed and vomits blood  . Naproxen Nausea And Vomiting, Nausea Only and Other (See Comments)    Makes stomach bleed. Vomits blood  . Sumatriptan Succinate Other (See Comments)    Tingling over entire body. Painful tingling in jaw/mouth  . Lactose Intolerance (Gi) Other (See Comments)    Gi upset Can not take whole milk but ice cream and yogurt okay    Current Medications: Current Outpatient Medications  Medication Sig Dispense Refill  . ALPRAZolam (XANAX) 0.25  MG tablet Take 0.25 mg by mouth 3 (three) times daily.    Marland Kitchen doxepin (SINEQUAN) 25 MG capsule Take 25 mg by mouth at bedtime.  1  . lamoTRIgine (LAMICTAL) 200 MG tablet Take 400 mg by mouth daily.    Marland Kitchen lurasidone (LATUDA) 40 MG TABS tablet Take 40 mg by mouth daily with breakfast.    . omeprazole (PRILOSEC) 20 MG capsule Take 20 mg by mouth daily.     . diphenhydrAMINE-PE-APAP 25-5-325 MG TABS Take 2 tablets by mouth daily as needed (for congestion).     No current facility-administered medications for  this visit.     Review of Systems  Constitutional: Positive for malaise/fatigue. Negative for chills, diaphoresis, fever and weight loss (up 6 pounds).       20 lb weight loss- intentional  HENT: Negative.  Negative for congestion, ear pain, nosebleeds, sinus pain and sore throat.   Eyes: Negative.  Negative for blurred vision, double vision and pain.  Respiratory: Negative.  Negative for cough, hemoptysis, sputum production and shortness of breath.   Cardiovascular: Negative.  Negative for chest pain, palpitations, orthopnea, leg swelling and PND.  Gastrointestinal: Negative for abdominal pain, blood in stool, constipation, diarrhea, heartburn (improved), melena, nausea and vomiting.  Genitourinary: Negative for dysuria, frequency, hematuria and urgency.  Musculoskeletal: Negative for back pain, falls, joint pain and myalgias.  Skin: Negative.  Negative for itching and rash.  Neurological: Negative for dizziness, tremors, focal weakness, weakness and headaches.       Carpal tunnel.  Endo/Heme/Allergies: Does not bruise/bleed easily.  Psychiatric/Behavioral: Negative for depression, memory loss and suicidal ideas. The patient has insomnia. The patient is not nervous/anxious.   All other systems reviewed and are negative.  Performance status (ECOG):  1  Physical Exam  Constitutional: She is oriented to person, place, and time. She appears well-developed and well-nourished. No distress.  Wearing mask.  Unaccompanied.  HENT:  Head: Normocephalic and atraumatic.  Mouth/Throat: Oropharynx is clear and moist. No oropharyngeal exudate.  Dark hair.  Wearing a mask.  Eyes: Pupils are equal, round, and reactive to light. Conjunctivae and EOM are normal. No scleral icterus.  Neck: Normal range of motion. Neck supple. No JVD present.  Cardiovascular: Normal rate, regular rhythm and normal heart sounds.  No murmur heard. Pulmonary/Chest: Effort normal and breath sounds normal. No respiratory  distress. She has no wheezes. She has no rales.  Abdominal: Soft. Bowel sounds are normal. She exhibits no distension and no mass. There is no abdominal tenderness. There is no rebound and no guarding.  Musculoskeletal: Normal range of motion.        General: No edema.  Lymphadenopathy:    She has no cervical adenopathy.    She has no axillary adenopathy.       Right: No supraclavicular adenopathy present.       Left: No supraclavicular adenopathy present.  Neurological: She is alert and oriented to person, place, and time.  Skin: Skin is warm and dry. No rash noted. She is not diaphoretic. No erythema. No pallor.  Warts on thumbs per patient 'improved'  Psychiatric: She has a normal mood and affect. Her behavior is normal. Judgment and thought content normal.  Nursing note and vitals reviewed.   No visits with results within 3 Day(s) from this visit.  Latest known visit with results is:  Appointment on 06/06/2019  Component Date Value Ref Range Status  . Ferritin 06/06/2019 55  11 - 307 ng/mL Final   Performed  at [facility], [address], [place], [place] [address]  . WBC 06/06/2019 11.1* 4.0 - 10.5 K/uL Final  . RBC 06/06/2019 5.46* 3.87 - 5.11 MIL/uL Final  . Hemoglobin 06/06/2019 11.7* 12.0 - 15.0 g/dL Final  . HCT 06/06/2019 38.2  36.0 - 46.0 % Final  . MCV 06/06/2019 70.0* 80.0 - 100.0 fL Final  . MCH 06/06/2019 21.4* 26.0 - 34.0 pg Final  . MCHC 06/06/2019 30.6  30.0 - 36.0 g/dL Final  . RDW 06/06/2019 15.2  11.5 - 15.5 % Final  . Platelets 06/06/2019 368  150 - 400 K/uL Final  . nRBC 06/06/2019 0.0  0.0 - 0.2 % Final  . Neutrophils Relative % 06/06/2019 67  % Final  . Neutro Abs 06/06/2019 7.4  1.7 - 7.7 K/uL Final  . Lymphocytes Relative 06/06/2019 24  % Final  . Lymphs Abs 06/06/2019 2.7  0.7 - 4.0 K/uL Final  . Monocytes Relative 06/06/2019 7  % Final  . Monocytes Absolute 06/06/2019 0.8  0.1 - 1.0 K/uL Final  . Eosinophils Relative 06/06/2019 1  % Final   . Eosinophils Absolute 06/06/2019 0.2  0.0 - 0.5 K/uL Final  . Basophils Relative 06/06/2019 1  % Final  . Basophils Absolute 06/06/2019 0.1  0.0 - 0.1 K/uL Final  . Immature Granulocytes 06/06/2019 0  % Final  . Abs Immature Granulocytes 06/06/2019 0.03  0.00 - 0.07 K/uL Final   Performed at [facility], [address] [place], [place] [address]    Assessment:  MEOSHIA HAWKEN is a 38 y.o. female with iron deficiency anemia who returns to clinic for repeat evaluation.  Diet appears good.  She has ice pica.  She denies any bleeding.  She was on oral iron.  CBC on 10/12/2017 revealed a hematocrit of 32.4, hemoglobin 9.9, and MCV 67.  Ferritin was 13 with an iron saturation of 15% and a TIBC of 440 on 11/09/2017.  Labs on 12/21/2017 revealed a hematocrit of 32.0, hemoglobin 10.2, and MCV 68.1.  Ferritin was 6.  Iron saturation 3% with a TIBC of 517.  Reticulocytes 1%.  B12 level was low at 195.  Folate normal at 13.9.  TSH was 0.893 (normal) and free T4 0.56 (0.61-1.12) on 12/28/2017.  BCR-ABL was negative on 01/25/2018.  Hemoglobin electrophoresis on 02/22/2018 revealed 3.5% Hgb A2 (1.8-3.2%), and Hgb A 96.5%.  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  She underwent planned cesarean section with bilateral tubal ligation on 04/14/2018.  She received Venofer weekly x 4 (02/08/2018 - 03/01/2018) and x 3 (02/07/2019-02/28/2019)  Ferritin has been followed:  13 on 11/09/2017, 6 on 12/21/2017, 12 on 01/24/2018, 11 on 02/07/2018, 197 on 03/06/2018, 26 on 05/03/2018, 33 on 08/01/2018, 32 on 11/08/2018, 26 on 02/01/2019, and 55 on 06/06/2019.   She has B12 deficiency.  B12 was 195 on 12/21/2017 and 177 on 01/24/2018.  She was on oral B12.  She began B12 injections on 01/25/2018 (last 05/09/2019).  Folate was 6.3 on 02/22/2018 and 9.3 on 05/03/2018.  Symptomatically, she continues to be fatigued though somewhat improved.   Plan: 1. Reviewed labs from  06/06/2019 independently and discussed in detail with patient.  2. Iron deficiency anemia Hemoglobin is 11.7.  MCV is 70.0. Ferritin is 55 Ferritin goal is 100.  Again reviewed that we will plan for IV iron if ferritin < 30 or symptomatic.  History of BTL but plan for urine pregnancy test prior to any  iron transfusions.  Hold venofer today given improvement in ferritin and improvement in symptoms.  3. B12 deficiency Patient receives B12 monthly (last 05/09/2019). Continue B12 today and monthly x 6.  4.   Disposition:   RTC in 3 months for labs (cbc with diff, ferritin) day to week prior to MD assessment, +/- venofer  and b12.   Continue monthly b12 injections  I discussed the assessment and treatment plan with the patient.  The patient was provided an opportunity to ask questions and all were answered.  The patient agreed with the plan and demonstrated an understanding of the instructions.  The patient was advised to call back if the symptoms worsen or if the condition fails to improve as anticipated.  [name], DNP, AGNP-C [place] [facility] [place] (work cell) [phone] (office)  CC: Dr. Mike Gip

## 2019-06-11 NOTE — Patient Instructions (Signed)

## 2019-07-06 ENCOUNTER — Other Ambulatory Visit: Payer: Self-pay

## 2019-07-09 ENCOUNTER — Other Ambulatory Visit: Payer: Self-pay

## 2019-07-09 ENCOUNTER — Inpatient Hospital Stay: Payer: Medicaid Other | Attending: Hematology and Oncology

## 2019-07-09 VITALS — BP 113/78 | HR 92 | Temp 96.6°F

## 2019-07-09 DIAGNOSIS — E538 Deficiency of other specified B group vitamins: Secondary | ICD-10-CM | POA: Diagnosis present

## 2019-07-09 DIAGNOSIS — D509 Iron deficiency anemia, unspecified: Secondary | ICD-10-CM

## 2019-07-09 MED ORDER — CYANOCOBALAMIN 1000 MCG/ML IJ SOLN
1000.0000 ug | Freq: Once | INTRAMUSCULAR | Status: AC
  Administered 2019-07-09: 1000 ug via INTRAMUSCULAR

## 2019-08-03 ENCOUNTER — Other Ambulatory Visit: Payer: Self-pay

## 2019-08-06 ENCOUNTER — Other Ambulatory Visit: Payer: Self-pay

## 2019-08-06 ENCOUNTER — Inpatient Hospital Stay: Payer: Medicaid Other | Attending: Hematology and Oncology

## 2019-08-06 VITALS — BP 131/87 | HR 91 | Temp 98.4°F | Resp 16

## 2019-08-06 DIAGNOSIS — D509 Iron deficiency anemia, unspecified: Secondary | ICD-10-CM

## 2019-08-06 DIAGNOSIS — E538 Deficiency of other specified B group vitamins: Secondary | ICD-10-CM | POA: Diagnosis not present

## 2019-08-06 MED ORDER — CYANOCOBALAMIN 1000 MCG/ML IJ SOLN
1000.0000 ug | Freq: Once | INTRAMUSCULAR | Status: AC
  Administered 2019-08-06: 1000 ug via INTRAMUSCULAR

## 2019-08-06 MED ORDER — CYANOCOBALAMIN 1000 MCG/ML IJ SOLN
INTRAMUSCULAR | Status: AC
  Filled 2019-08-06: qty 1

## 2019-08-06 NOTE — Patient Instructions (Signed)

## 2019-09-10 ENCOUNTER — Inpatient Hospital Stay: Payer: Medicaid Other

## 2019-09-10 ENCOUNTER — Inpatient Hospital Stay: Payer: Medicaid Other | Attending: Hematology and Oncology

## 2019-09-10 ENCOUNTER — Other Ambulatory Visit: Payer: Self-pay

## 2019-09-10 VITALS — Temp 98.4°F | Resp 16

## 2019-09-10 DIAGNOSIS — E538 Deficiency of other specified B group vitamins: Secondary | ICD-10-CM | POA: Insufficient documentation

## 2019-09-10 DIAGNOSIS — D509 Iron deficiency anemia, unspecified: Secondary | ICD-10-CM | POA: Diagnosis not present

## 2019-09-10 LAB — CBC WITH DIFFERENTIAL/PLATELET
Abs Immature Granulocytes: 0.03 10*3/uL (ref 0.00–0.07)
Basophils Absolute: 0.1 10*3/uL (ref 0.0–0.1)
Basophils Relative: 1 %
Eosinophils Absolute: 0.1 10*3/uL (ref 0.0–0.5)
Eosinophils Relative: 2 %
HCT: 35.6 % — ABNORMAL LOW (ref 36.0–46.0)
Hemoglobin: 10.8 g/dL — ABNORMAL LOW (ref 12.0–15.0)
Immature Granulocytes: 0 %
Lymphocytes Relative: 32 %
Lymphs Abs: 3 10*3/uL (ref 0.7–4.0)
MCH: 21.4 pg — ABNORMAL LOW (ref 26.0–34.0)
MCHC: 30.3 g/dL (ref 30.0–36.0)
MCV: 70.6 fL — ABNORMAL LOW (ref 80.0–100.0)
Monocytes Absolute: 0.8 10*3/uL (ref 0.1–1.0)
Monocytes Relative: 8 %
Neutro Abs: 5.5 10*3/uL (ref 1.7–7.7)
Neutrophils Relative %: 57 %
Platelets: 366 10*3/uL (ref 150–400)
RBC: 5.04 MIL/uL (ref 3.87–5.11)
RDW: 15.8 % — ABNORMAL HIGH (ref 11.5–15.5)
WBC: 9.5 10*3/uL (ref 4.0–10.5)
nRBC: 0 % (ref 0.0–0.2)

## 2019-09-10 LAB — FERRITIN: Ferritin: 53 ng/mL (ref 11–307)

## 2019-09-10 MED ORDER — CYANOCOBALAMIN 1000 MCG/ML IJ SOLN
1000.0000 ug | Freq: Once | INTRAMUSCULAR | Status: AC
  Administered 2019-09-10: 1000 ug via INTRAMUSCULAR

## 2019-09-10 NOTE — Patient Instructions (Signed)

## 2019-09-15 NOTE — Progress Notes (Signed)
[name]  [address] [place], [place] [address] Phone: [phone]  Fax: [phone]   Telemedicine Office Visit:  09/17/2019  Referring physician: Valera Castle, *  I connected with Dominique Dyer on 09/17/19 at 2:44 PM by videoconferencing and verified that I was speaking with the correct person using 2 identifiers.  The patient was in her car.  I discussed the limitations, risk, security and privacy concerns of performing an evaluation and management service by videoconferencing and the availability of in person appointments.  I also discussed with the patient that there may be a patient responsible charge related to this service.  The patient expressed understanding and agreed to proceed.   Chief Complaint: Dominique Dyer is a 38 y.o. female with iron deficiency anemia and B12 deficiency who is seen for 3 month assessment   HPI: The patient was last seen in the hematology clinic by [name], NP on 06/11/2019. At that time, she continued to be fatigued though somewhat improved.  Hematocrit was 38.2, hemoglobin 11.7, and MCV 70.0.  Ferritin was 55.  Labs on 09/10/2019 revealed a hematocrit 35.6, hemoglobin 10.8, MCV 70.6, platelets 366,000, WBC 9500, ANC 5500. Ferritin was 53.  She continued to receive monthly B12 injections (07/09/2019 - 09/10/2019).  During the interim, she has felt the same. She is chronically fatigued. She has trouble sleeping, she would like a referral to have sleep study to rule out sleep apnea.    Past Medical History:  Diagnosis Date  . Anemia 2019   iron deficiency and b12 deficiency  . Anginal pain ([place]) 2019   with pregnancy. ekg fine. cardiology reviewed  . Anxiety   . Bipolar disorder ([place])   . Dyspnea    with pregnancy  . Dysrhythmia 2019   tachycardia with pregnancy and panic attacks  . GERD (gastroesophageal reflux disease)   . Hypertension 2019   with pregnancy  . Migraines    has  not had as many recently.  at most, once a month  . Panic attacks   . Pica 2019   ice    Past Surgical History:  Procedure Laterality Date  . CESAREAN SECTION    . CESAREAN SECTION Bilateral 04/14/2018   Procedure: REPEAT CESAREAN SECTION WITH BTL;  Surgeon: [name], MD;  Location: [facility] ORS;  Service: Obstetrics;  Laterality: Bilateral;  Birth: 10:45 Sex: Female Weight: 8 lbs 3 oz   . COLPOSCOPY  2006    Family History  Problem Relation Age of Onset  . Cancer Paternal Uncle   . Cancer Maternal Grandfather   . Heart attack Mother 67  . Seizures Father   . Heart attack Paternal Grandfather     Social History:  reports that she has quit smoking. Her smoking use included cigarettes. She has never used smokeless tobacco. She reports previous drug use. Drug: Marijuana. She reports that she does not drink alcohol. Patient is originally from [place], [place]. She lives in [place]. Patient is unemployed. Patient denies known exposures to radiation on toxins.  The patient is alone today.  Participants in the patient's visit and their role in the encounter included the patient, Dominique Dyer, scribe, and Dominique Dyer, CMA, today.  The intake visit was provided by Dominique Dyer, scribe, Dominique Dyer, CMA.   Allergies:  Allergies  Allergen Reactions  . Aspirin Nausea Only and Other (See Comments)    Makes stomach bleed and vomits blood  . Naproxen Nausea And Vomiting, Nausea  Only and Other (See Comments)    Makes stomach bleed. Vomits blood  . Sumatriptan Succinate Other (See Comments)    Tingling over entire body. Painful tingling in jaw/mouth  . Lactose Intolerance (Gi) Other (See Comments)    Gi upset Can not take whole milk but ice cream and yogurt okay    Current Medications: Current Outpatient Medications  Medication Sig Dispense Refill  . ALPRAZolam (XANAX) 0.25 MG tablet Take 0.25 mg by mouth 3 (three) times daily.    Dominique Dyer doxepin (SINEQUAN) 25 MG capsule  Take 25 mg by mouth at bedtime.  1  . lamoTRIgine (LAMICTAL) 200 MG tablet Take 400 mg by mouth daily.    Dominique Dyer lurasidone (LATUDA) 40 MG TABS tablet Take 40 mg by mouth daily with breakfast.    . omeprazole (PRILOSEC) 20 MG capsule Take 20 mg by mouth daily.     . diphenhydrAMINE-PE-APAP 25-5-325 MG TABS Take 2 tablets by mouth daily as needed (for congestion).     No current facility-administered medications for this visit.    Review of Systems  Constitutional: Positive for malaise/fatigue. Negative for chills, diaphoresis, fever and weight loss (stable, fluctuates 3-4 lbs).       She has felt fatigued forever.  20 lb weight loss- intentional.  HENT: Negative.  Negative for congestion, ear pain, nosebleeds, sinus pain and sore throat.   Eyes: Negative.  Negative for blurred vision, double vision and pain.  Respiratory: Negative.  Negative for cough, hemoptysis, sputum production and shortness of breath.   Cardiovascular: Negative.  Negative for chest pain, palpitations, orthopnea, leg swelling and PND.  Gastrointestinal: Negative for abdominal pain, blood in stool, constipation, diarrhea, heartburn (improved), melena, nausea and vomiting.  Genitourinary: Negative.  Negative for dysuria, frequency, hematuria and urgency.  Musculoskeletal: Negative.  Negative for back pain, falls, joint pain and myalgias.  Skin: Negative.  Negative for itching and rash.  Neurological: Negative for dizziness, tremors, sensory change, speech change, focal weakness, weakness and headaches.       Carpal tunnel.  Endo/Heme/Allergies: Negative.  Does not bruise/bleed easily.  Psychiatric/Behavioral: Negative for depression, memory loss and suicidal ideas. The patient has insomnia. The patient is not nervous/anxious.   All other systems reviewed and are negative.  Performance status (ECOG): 1  Vitals unknown if currently breastfeeding.   Physical Exam  Constitutional: She appears well-developed and  well-nourished. No distress.  HENT:  Head: Normocephalic and atraumatic.  Brown hair pulled back.  Eyes: Conjunctivae are normal. No scleral icterus.  Neurological: She is alert. She has normal reflexes.  Skin: She is not diaphoretic.  Psychiatric: She has a normal mood and affect. Her behavior is normal. Judgment and thought content normal.  Nursing note reviewed.   No visits with results within 3 Day(s) from this visit.  Latest known visit with results is:  Appointment on 09/10/2019  Component Date Value Ref Range Status  . Ferritin 09/10/2019 53  11 - 307 ng/mL Final   Performed at [facility], [address] [place], [place] [address]  . WBC 09/10/2019 9.5  4.0 - 10.5 K/uL Final  . RBC 09/10/2019 5.04  3.87 - 5.11 MIL/uL Final  . Hemoglobin 09/10/2019 10.8* 12.0 - 15.0 g/dL Final  . HCT 09/10/2019 35.6* 36.0 - 46.0 % Final  . MCV 09/10/2019 70.6* 80.0 - 100.0 fL Final  . MCH 09/10/2019 21.4* 26.0 - 34.0 pg Final  . MCHC 09/10/2019 30.3  30.0 - 36.0 g/dL Final  . RDW 09/10/2019 15.8*  11.5 - 15.5 % Final  . Platelets 09/10/2019 366  150 - 400 K/uL Final  . nRBC 09/10/2019 0.0  0.0 - 0.2 % Final  . Neutrophils Relative % 09/10/2019 57  % Final  . Neutro Abs 09/10/2019 5.5  1.7 - 7.7 K/uL Final  . Lymphocytes Relative 09/10/2019 32  % Final  . Lymphs Abs 09/10/2019 3.0  0.7 - 4.0 K/uL Final  . Monocytes Relative 09/10/2019 8  % Final  . Monocytes Absolute 09/10/2019 0.8  0.1 - 1.0 K/uL Final  . Eosinophils Relative 09/10/2019 2  % Final  . Eosinophils Absolute 09/10/2019 0.1  0.0 - 0.5 K/uL Final  . Basophils Relative 09/10/2019 1  % Final  . Basophils Absolute 09/10/2019 0.1  0.0 - 0.1 K/uL Final  . Immature Granulocytes 09/10/2019 0  % Final  . Abs Immature Granulocytes 09/10/2019 0.03  0.00 - 0.07 K/uL Final   Performed at [facility], [address], [place], [place] [address]    Assessment:  CHARMION HEHR is a 38 y.o. female withiron  deficiency anemia who returns to clinic for repeat evaluation. Dietappears good. She has ice pica. She denies any bleeding. She was on oral iron.  CBC on 01/23/2019revealed a hematocrit of 32.4, hemoglobin 9.9, and MCV 67. Ferritin was 13 with an iron saturation of 15% and a TIBC of 440 on 11/09/2017.  Labs on 04/03/2019revealed a hematocrit of 32.0, hemoglobin 10.2, and MCV 68.1. Ferritin was 6. Iron saturation 3% with a TIBC of 517. Reticulocytes 1%. B12level was low at 195. Folate normal at 13.9. TSH was 0.893 (normal) and free T4 0.56 (0.61-1.12) on 12/28/2017. BCR-ABL was negative on 01/25/2018.  Hemoglobin electrophoresison 02/22/2018 revealed 3.5% Hgb A2 (1.8-3.2%), and Hgb A 96.5%. 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  She underwent planned cesarean sectionwith bilateral tubal ligation on 04/14/2018.  She received Venoferweekly x 4 (02/08/2018 - 03/01/2018) and x 3 (02/07/2019 - 02/28/2019)  Ferritinhas been followed: 13 on 11/09/2017, 6 on 12/21/2017, 12 on 01/24/2018, 11 on 02/07/2018, 197 on 03/06/2018, 26 on 05/03/2018, 33 on 08/01/2018, 32 on 11/08/2018, 26 on 02/01/2019, 55 on 06/06/2019, and 53 on 09/10/2019.   She has B12 deficiency. B12 was 195 on 12/21/2017 and 177 on 01/24/2018. She was on oral B12. She began B12 injectionson 01/25/2018 (last 09/10/2019). Folate was 6.3 on 02/22/2018 and 9.3 on 05/03/2018.  Symptomatically, she remains fatigued.  She denies any bleeding.  Plan: 1.   Review labs from 09/10/2019. 2.   Iron deficiency anemia Hemoglobin is 10.8.  MCV is 70.6. Ferritin is  53 Ferritin goal is 100.  Review plan for IV iron if ferritin < 30 or symptomatic.  History of BTL with plan for urine pregnancy test prior to any iron transfusions.  No Venofer today.  3.   B12 deficiency Continue B12 monthly x 6 (last 09/10/2019). Continue B12 today and monthly x 6 (due 10/08/2019).  4.   Disposition              RTC in 3 months for labs (cbc with diff, ferritin) day to week prior to MD assessment, +/- Venofer and B12.              Continue monthly B12 injections. 5.   RTC on 10/08/2019 for labs (CBC, sed rate, iron studies). 6.   RTC in 3 months (on a day of B12) for labs (CBC, ferritin) and B12 7.   RTC in 6 months (on  a day of B12) for MD assessment, labs (CBC with diff, ferritin, iron studies-day before), B12, and +/- Venofer.  I discussed the assessment and treatment plan with the patient.  The patient was provided an opportunity to ask questions and all were answered.  The patient agreed with the plan and demonstrated an understanding of the instructions.  The patient was advised to call back if the symptoms worsen or if the condition fails to improve as anticipated.  I provided 16 minutes (2:44 PM - 2:59 PM) of face-to-face time during this this encounter and > 50% was spent counseling as documented under my assessment and plan.    Lequita Asal, MD, PhD    09/17/2019, 2:59 PM  I, Dominique Dyer, am acting as a scribe for Lequita Asal, MD.  I, Ackerly Mike Gip, MD, have reviewed the above documentation for accuracy and completeness, and I agree with the above.

## 2019-09-17 ENCOUNTER — Inpatient Hospital Stay (HOSPITAL_BASED_OUTPATIENT_CLINIC_OR_DEPARTMENT_OTHER): Payer: Medicaid Other | Admitting: Hematology and Oncology

## 2019-09-17 ENCOUNTER — Encounter: Payer: Self-pay | Admitting: Hematology and Oncology

## 2019-09-17 ENCOUNTER — Inpatient Hospital Stay: Payer: Medicaid Other

## 2019-09-17 ENCOUNTER — Other Ambulatory Visit: Payer: Self-pay

## 2019-09-17 DIAGNOSIS — E538 Deficiency of other specified B group vitamins: Secondary | ICD-10-CM | POA: Diagnosis not present

## 2019-09-17 DIAGNOSIS — D509 Iron deficiency anemia, unspecified: Secondary | ICD-10-CM | POA: Diagnosis not present

## 2019-09-17 NOTE — Progress Notes (Signed)
No new changes noted today. The patient Name and DOB has been verified by phone today. 

## 2019-10-05 ENCOUNTER — Other Ambulatory Visit: Payer: Self-pay

## 2019-10-06 IMAGING — CR DG CHEST 2V
2 series · 2 of 2 positions shown · non-contrast
Comparison: Chest radiograph 02/23/2014.

CLINICAL DATA: 36-year-old female with shortness of breath and
chest pain.

EXAM:
CHEST - 2 VIEW

[chest pa]
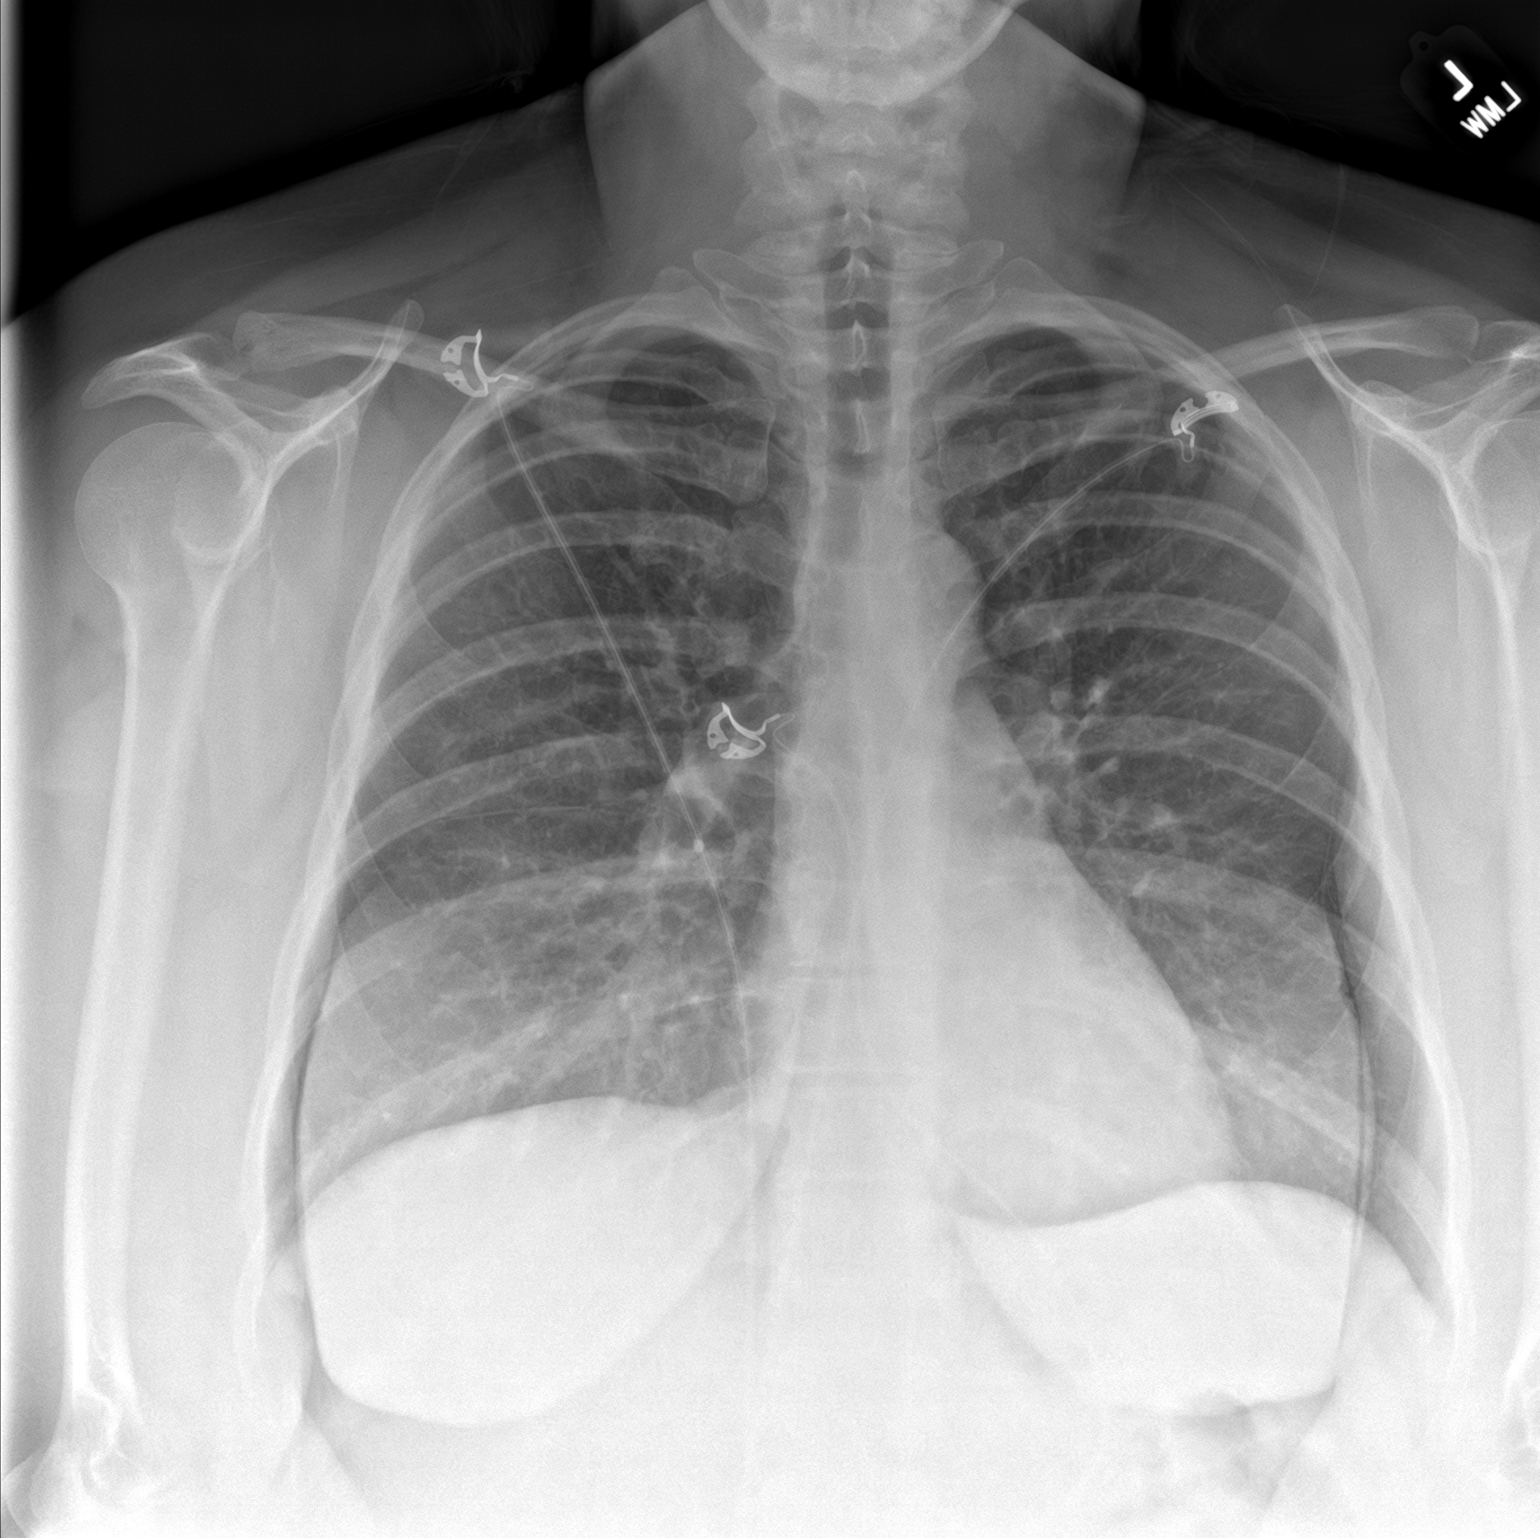

[chest lat]
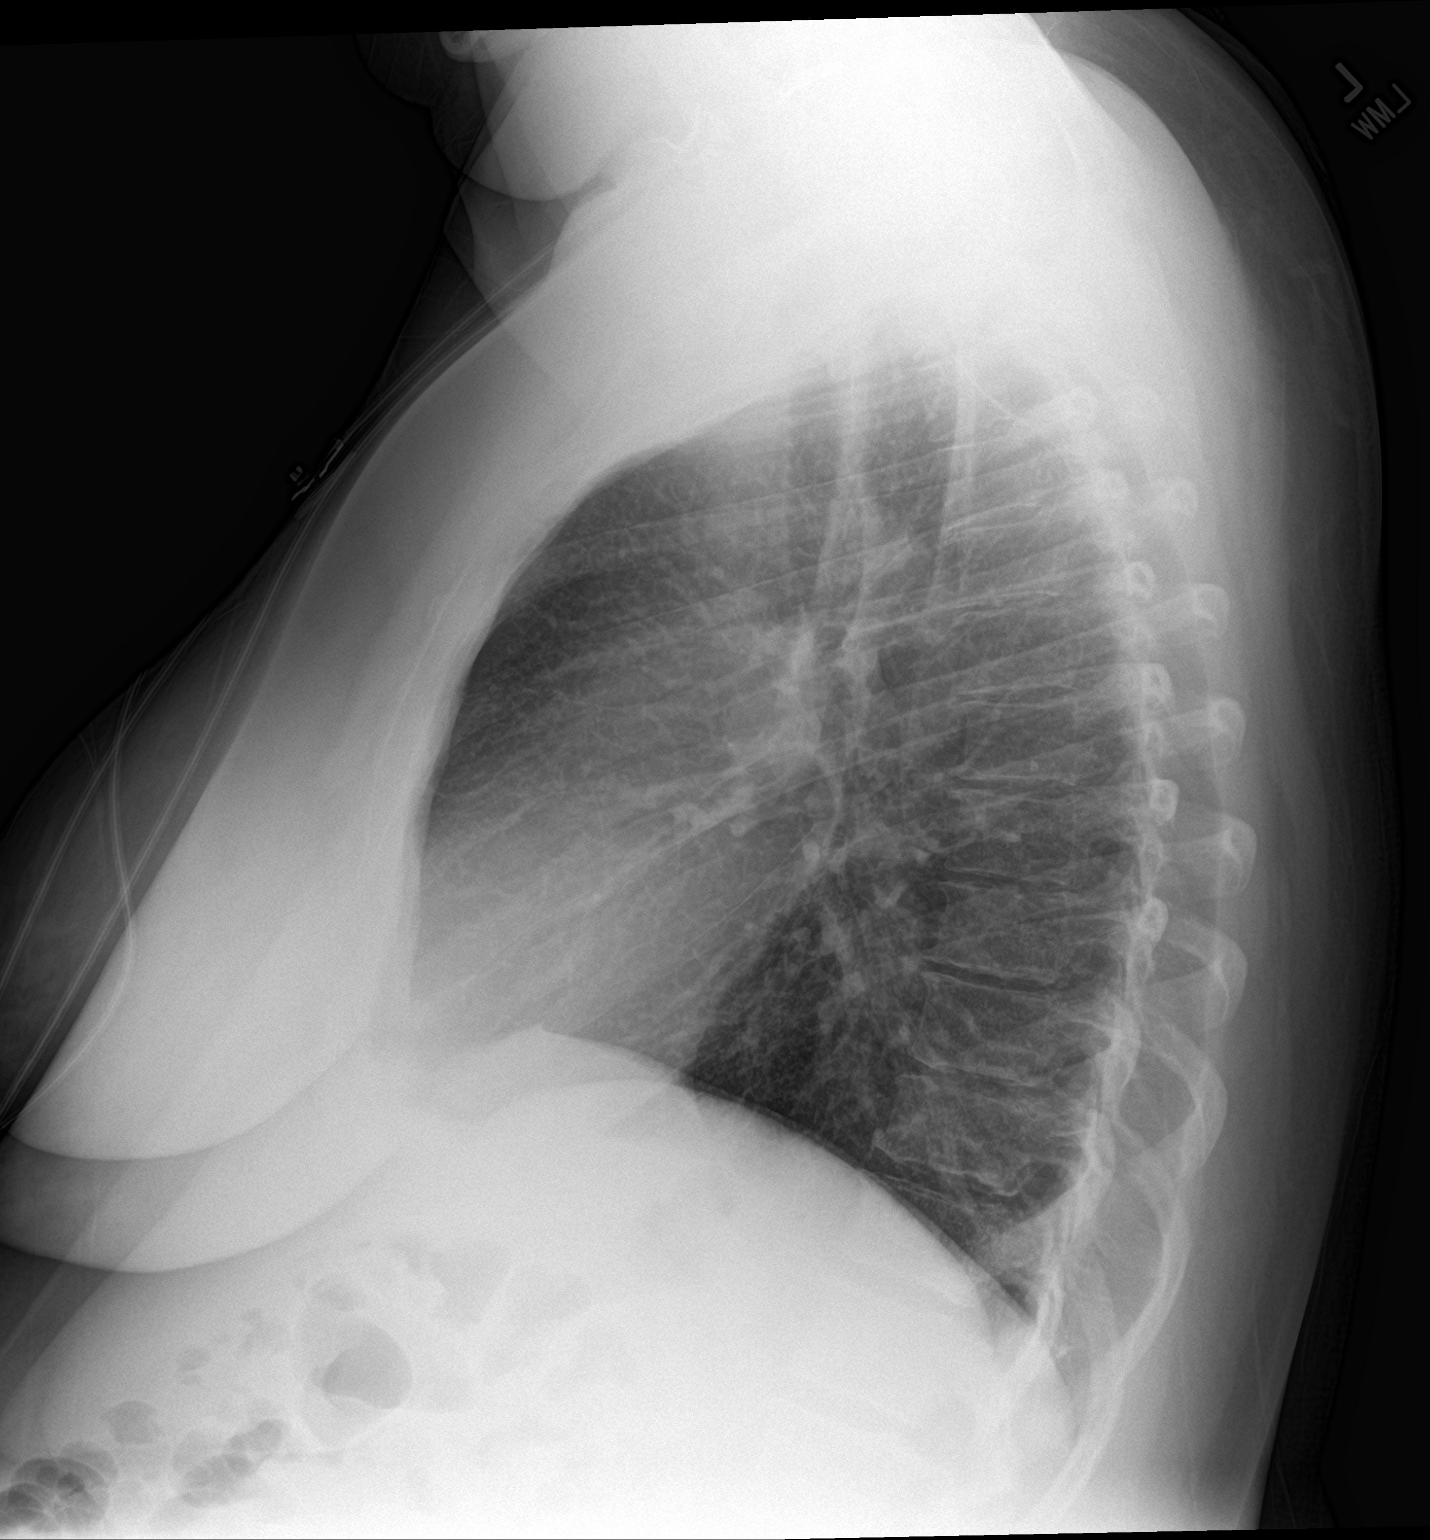

[2 of 2 positions shown; findings below may reference images not displayed]

FINDINGS: Skin fold artifact along each lateral lower chest wall on the PA
view. Lung volumes are stable and within normal limits. Mediastinal
contours remain normal. Visualized tracheal air column is within
normal limits. The lungs are clear. No pneumothorax or pleural
effusion. No acute osseous abnormality identified. Negative visible
bowel gas pattern.
IMPRESSION: Negative.  No acute cardiopulmonary abnormality.

## 2019-10-08 ENCOUNTER — Inpatient Hospital Stay: Payer: Medicaid Other | Attending: Hematology and Oncology

## 2019-10-08 ENCOUNTER — Inpatient Hospital Stay: Payer: Medicaid Other

## 2019-10-08 ENCOUNTER — Telehealth: Payer: Self-pay

## 2019-10-08 ENCOUNTER — Other Ambulatory Visit: Payer: Self-pay

## 2019-10-08 VITALS — BP 115/73 | HR 74 | Temp 97.0°F | Resp 17

## 2019-10-08 DIAGNOSIS — D509 Iron deficiency anemia, unspecified: Secondary | ICD-10-CM | POA: Insufficient documentation

## 2019-10-08 DIAGNOSIS — Z87891 Personal history of nicotine dependence: Secondary | ICD-10-CM | POA: Insufficient documentation

## 2019-10-08 DIAGNOSIS — Z79899 Other long term (current) drug therapy: Secondary | ICD-10-CM | POA: Insufficient documentation

## 2019-10-08 DIAGNOSIS — E538 Deficiency of other specified B group vitamins: Secondary | ICD-10-CM | POA: Insufficient documentation

## 2019-10-08 LAB — CBC
HCT: 35.9 % — ABNORMAL LOW (ref 36.0–46.0)
Hemoglobin: 11 g/dL — ABNORMAL LOW (ref 12.0–15.0)
MCH: 21.8 pg — ABNORMAL LOW (ref 26.0–34.0)
MCHC: 30.6 g/dL (ref 30.0–36.0)
MCV: 71.2 fL — ABNORMAL LOW (ref 80.0–100.0)
Platelets: 377 10*3/uL (ref 150–400)
RBC: 5.04 MIL/uL (ref 3.87–5.11)
RDW: 15.4 % (ref 11.5–15.5)
WBC: 10.6 10*3/uL — ABNORMAL HIGH (ref 4.0–10.5)
nRBC: 0 % (ref 0.0–0.2)

## 2019-10-08 LAB — SEDIMENTATION RATE: Sed Rate: 13 mm/hr (ref 0–20)

## 2019-10-08 LAB — FERRITIN: Ferritin: 51 ng/mL (ref 11–307)

## 2019-10-08 MED ORDER — CYANOCOBALAMIN 1000 MCG/ML IJ SOLN
1000.0000 ug | Freq: Once | INTRAMUSCULAR | Status: AC
  Administered 2019-10-08: 1000 ug via INTRAMUSCULAR

## 2019-10-08 NOTE — Telephone Encounter (Signed)
-----   Message from Lequita Asal, MD sent at 10/04/2019  4:32 PM EST ----- Regarding: RE: sleep study Contact: [phone]  Please call patient.  They are not doing sleep studies at this time due to COVID-19.  They might be able to do a home overnight oximetry.  M ----- Message ----- From: Secundino Ginger Sent: 10/04/2019   4:02 PM EST To: Lequita Asal, MD Subject: sleep study                                    Pateint called and said you talked to her about a sleep study. She wanted to know if you talked to her PCP yet about it?

## 2019-10-08 NOTE — Telephone Encounter (Signed)
Attempted to contact patient to inform her that Sleep Studies are not being performed at this time due to [place]. VM left requesting callback.

## 2019-11-05 ENCOUNTER — Inpatient Hospital Stay: Payer: Medicaid Other

## 2019-11-09 ENCOUNTER — Other Ambulatory Visit: Payer: Self-pay

## 2019-11-12 ENCOUNTER — Other Ambulatory Visit: Payer: Self-pay

## 2019-11-12 ENCOUNTER — Inpatient Hospital Stay: Payer: Medicaid Other | Attending: Hematology and Oncology

## 2019-11-12 VITALS — BP 125/88 | HR 78 | Temp 97.1°F | Resp 16

## 2019-11-12 DIAGNOSIS — E538 Deficiency of other specified B group vitamins: Secondary | ICD-10-CM | POA: Insufficient documentation

## 2019-11-12 DIAGNOSIS — D509 Iron deficiency anemia, unspecified: Secondary | ICD-10-CM

## 2019-11-12 MED ORDER — CYANOCOBALAMIN 1000 MCG/ML IJ SOLN
1000.0000 ug | Freq: Once | INTRAMUSCULAR | Status: AC
  Administered 2019-11-12: 14:00:00 1000 ug via INTRAMUSCULAR

## 2019-11-12 NOTE — Patient Instructions (Signed)

## 2019-12-03 ENCOUNTER — Inpatient Hospital Stay: Payer: Medicaid Other

## 2019-12-03 ENCOUNTER — Other Ambulatory Visit: Payer: Medicaid Other

## 2019-12-10 ENCOUNTER — Inpatient Hospital Stay: Payer: Medicaid Other

## 2019-12-10 ENCOUNTER — Inpatient Hospital Stay: Payer: Medicaid Other | Attending: Hematology and Oncology

## 2019-12-10 ENCOUNTER — Other Ambulatory Visit: Payer: Self-pay

## 2019-12-10 DIAGNOSIS — Z79899 Other long term (current) drug therapy: Secondary | ICD-10-CM | POA: Insufficient documentation

## 2019-12-10 DIAGNOSIS — Z87891 Personal history of nicotine dependence: Secondary | ICD-10-CM | POA: Insufficient documentation

## 2019-12-10 DIAGNOSIS — E538 Deficiency of other specified B group vitamins: Secondary | ICD-10-CM | POA: Insufficient documentation

## 2019-12-10 DIAGNOSIS — D509 Iron deficiency anemia, unspecified: Secondary | ICD-10-CM

## 2019-12-10 LAB — CBC
HCT: 36.2 % (ref 36.0–46.0)
Hemoglobin: 10.9 g/dL — ABNORMAL LOW (ref 12.0–15.0)
MCH: 21.4 pg — ABNORMAL LOW (ref 26.0–34.0)
MCHC: 30.1 g/dL (ref 30.0–36.0)
MCV: 71 fL — ABNORMAL LOW (ref 80.0–100.0)
Platelets: 381 10*3/uL (ref 150–400)
RBC: 5.1 MIL/uL (ref 3.87–5.11)
RDW: 15.1 % (ref 11.5–15.5)
WBC: 11 10*3/uL — ABNORMAL HIGH (ref 4.0–10.5)
nRBC: 0 % (ref 0.0–0.2)

## 2019-12-10 LAB — FERRITIN: Ferritin: 38 ng/mL (ref 11–307)

## 2019-12-10 MED ORDER — CYANOCOBALAMIN 1000 MCG/ML IJ SOLN
1000.0000 ug | Freq: Once | INTRAMUSCULAR | Status: AC
  Administered 2019-12-10: 1000 ug via INTRAMUSCULAR

## 2019-12-10 NOTE — Patient Instructions (Signed)

## 2019-12-11 ENCOUNTER — Telehealth: Payer: Self-pay

## 2019-12-11 NOTE — Telephone Encounter (Signed)
-----   Message from Lequita Asal, MD sent at 12/11/2019  5:53 AM EDT ----- Regarding: Please call patient  Ferritin has drifted down to 38.  Hemoglobin is 10.9.  Ask if any symptoms of iron deficiency.  M ----- Message ----- From: Buel Ream, [facility] [place] Sent: 12/10/2019   2:23 PM EDT To: Lequita Asal, MD

## 2019-12-11 NOTE — Telephone Encounter (Signed)
Informed patient of Ferritin and HGB levels. Patient states she has been more exhausted than usual and is interested in IV Iron. Informed Dr. Mike Gip.

## 2019-12-13 ENCOUNTER — Other Ambulatory Visit: Payer: Self-pay

## 2019-12-13 DIAGNOSIS — D509 Iron deficiency anemia, unspecified: Secondary | ICD-10-CM

## 2019-12-17 ENCOUNTER — Inpatient Hospital Stay: Payer: Medicaid Other

## 2019-12-17 VITALS — BP 119/70 | HR 86 | Temp 97.4°F | Resp 18

## 2019-12-17 DIAGNOSIS — D509 Iron deficiency anemia, unspecified: Secondary | ICD-10-CM

## 2019-12-17 DIAGNOSIS — E538 Deficiency of other specified B group vitamins: Secondary | ICD-10-CM | POA: Diagnosis not present

## 2019-12-17 LAB — PREGNANCY, URINE: Preg Test, Ur: NEGATIVE

## 2019-12-17 MED ORDER — SODIUM CHLORIDE 0.9 % IV SOLN
Freq: Once | INTRAVENOUS | Status: AC
  Filled 2019-12-17: qty 250

## 2019-12-17 MED ORDER — IRON SUCROSE 20 MG/ML IV SOLN
200.0000 mg | Freq: Once | INTRAVENOUS | Status: AC
  Administered 2019-12-17: 200 mg via INTRAVENOUS

## 2019-12-17 NOTE — Patient Instructions (Signed)

## 2019-12-24 ENCOUNTER — Inpatient Hospital Stay: Payer: Medicaid Other | Attending: Hematology and Oncology

## 2019-12-24 ENCOUNTER — Inpatient Hospital Stay: Payer: Medicaid Other

## 2019-12-24 ENCOUNTER — Other Ambulatory Visit: Payer: Self-pay

## 2019-12-24 VITALS — BP 111/71 | HR 84 | Temp 96.5°F | Resp 18

## 2019-12-24 DIAGNOSIS — D509 Iron deficiency anemia, unspecified: Secondary | ICD-10-CM | POA: Diagnosis not present

## 2019-12-24 DIAGNOSIS — E538 Deficiency of other specified B group vitamins: Secondary | ICD-10-CM | POA: Insufficient documentation

## 2019-12-24 LAB — PREGNANCY, URINE: Preg Test, Ur: NEGATIVE

## 2019-12-24 MED ORDER — IRON SUCROSE 20 MG/ML IV SOLN
200.0000 mg | Freq: Once | INTRAVENOUS | Status: AC
  Administered 2019-12-24: 15:00:00 200 mg via INTRAVENOUS

## 2019-12-24 MED ORDER — SODIUM CHLORIDE 0.9 % IV SOLN
Freq: Once | INTRAVENOUS | Status: AC
  Filled 2019-12-24: qty 250

## 2019-12-24 NOTE — Patient Instructions (Signed)

## 2019-12-31 ENCOUNTER — Inpatient Hospital Stay: Payer: Medicaid Other

## 2020-01-07 ENCOUNTER — Other Ambulatory Visit: Payer: Self-pay

## 2020-01-07 ENCOUNTER — Inpatient Hospital Stay: Payer: Medicaid Other

## 2020-01-07 VITALS — BP 126/82 | HR 87 | Temp 97.0°F | Resp 18

## 2020-01-07 DIAGNOSIS — D509 Iron deficiency anemia, unspecified: Secondary | ICD-10-CM | POA: Diagnosis not present

## 2020-01-07 MED ORDER — CYANOCOBALAMIN 1000 MCG/ML IJ SOLN
1000.0000 ug | Freq: Once | INTRAMUSCULAR | Status: AC
  Administered 2020-01-07: 14:00:00 1000 ug via INTRAMUSCULAR

## 2020-01-07 NOTE — Patient Instructions (Signed)

## 2020-01-28 ENCOUNTER — Inpatient Hospital Stay: Payer: Medicaid Other

## 2020-02-04 ENCOUNTER — Other Ambulatory Visit: Payer: Self-pay

## 2020-02-04 ENCOUNTER — Inpatient Hospital Stay: Payer: Medicaid Other | Attending: Hematology and Oncology

## 2020-02-04 VITALS — BP 115/77 | HR 86 | Resp 18

## 2020-02-04 DIAGNOSIS — E538 Deficiency of other specified B group vitamins: Secondary | ICD-10-CM | POA: Diagnosis present

## 2020-02-04 DIAGNOSIS — D509 Iron deficiency anemia, unspecified: Secondary | ICD-10-CM

## 2020-02-04 MED ORDER — CYANOCOBALAMIN 1000 MCG/ML IJ SOLN
1000.0000 ug | Freq: Once | INTRAMUSCULAR | Status: AC
  Administered 2020-02-04: 1000 ug via INTRAMUSCULAR
  Filled 2020-02-04: qty 1

## 2020-02-04 MED ORDER — IRON SUCROSE 20 MG/ML IV SOLN: 200.0000 mg | Freq: Once | INTRAVENOUS | Status: DC

## 2020-02-22 ENCOUNTER — Other Ambulatory Visit: Payer: Medicaid Other

## 2020-02-25 ENCOUNTER — Ambulatory Visit: Payer: Medicaid Other | Admitting: Hematology and Oncology

## 2020-02-25 ENCOUNTER — Inpatient Hospital Stay: Payer: Medicaid Other

## 2020-02-25 ENCOUNTER — Ambulatory Visit: Payer: Medicaid Other

## 2020-02-25 ENCOUNTER — Inpatient Hospital Stay: Payer: Medicaid Other | Attending: Hematology and Oncology

## 2020-02-25 ENCOUNTER — Other Ambulatory Visit: Payer: Self-pay

## 2020-02-25 DIAGNOSIS — Z79899 Other long term (current) drug therapy: Secondary | ICD-10-CM | POA: Insufficient documentation

## 2020-02-25 DIAGNOSIS — E538 Deficiency of other specified B group vitamins: Secondary | ICD-10-CM | POA: Diagnosis present

## 2020-02-25 DIAGNOSIS — D509 Iron deficiency anemia, unspecified: Secondary | ICD-10-CM | POA: Diagnosis present

## 2020-02-25 LAB — FERRITIN: Ferritin: 76 ng/mL (ref 11–307)

## 2020-02-25 LAB — CBC WITH DIFFERENTIAL/PLATELET
Abs Immature Granulocytes: 0.04 10*3/uL (ref 0.00–0.07)
Basophils Absolute: 0 10*3/uL (ref 0.0–0.1)
Basophils Relative: 0 %
Eosinophils Absolute: 0.2 10*3/uL (ref 0.0–0.5)
Eosinophils Relative: 2 %
HCT: 36.7 % (ref 36.0–46.0)
Hemoglobin: 11.2 g/dL — ABNORMAL LOW (ref 12.0–15.0)
Immature Granulocytes: 0 %
Lymphocytes Relative: 27 %
Lymphs Abs: 3 10*3/uL (ref 0.7–4.0)
MCH: 21.7 pg — ABNORMAL LOW (ref 26.0–34.0)
MCHC: 30.5 g/dL (ref 30.0–36.0)
MCV: 71.1 fL — ABNORMAL LOW (ref 80.0–100.0)
Monocytes Absolute: 0.7 10*3/uL (ref 0.1–1.0)
Monocytes Relative: 6 %
Neutro Abs: 7.2 10*3/uL (ref 1.7–7.7)
Neutrophils Relative %: 65 %
Platelets: 353 10*3/uL (ref 150–400)
RBC: 5.16 MIL/uL — ABNORMAL HIGH (ref 3.87–5.11)
RDW: 15.7 % — ABNORMAL HIGH (ref 11.5–15.5)
WBC: 11 10*3/uL — ABNORMAL HIGH (ref 4.0–10.5)
nRBC: 0 % (ref 0.0–0.2)

## 2020-02-25 LAB — IRON AND TIBC
Iron: 37 ug/dL (ref 28–170)
Saturation Ratios: 11 % (ref 10.4–31.8)
TIBC: 337 ug/dL (ref 250–450)
UIBC: 300 ug/dL

## 2020-02-28 ENCOUNTER — Other Ambulatory Visit: Payer: Medicaid Other

## 2020-02-29 ENCOUNTER — Other Ambulatory Visit: Payer: Medicaid Other

## 2020-02-29 NOTE — Progress Notes (Signed)
[facility]  [address], [address] [place], [place] [address] Phone: [phone]  Fax: [phone]   Telemedicine Office Visit:  03/03/2020  Referring physician: Valera Castle, *  I connected with Dominique Dyer on 03/03/2020 at 1:22 PM by videoconferencing and verified that I was speaking with the correct person using 2 identifiers.  The patient was at home.  I discussed the limitations, risk, security and privacy concerns of performing an evaluation and management service by videoconferencing and the availability of in person appointments.  I also discussed with the patient that there may be a patient responsible charge related to this service.  The patient expressed understanding and agreed to proceed.   Chief Complaint: Dominique Dyer is a 39 y.o. female with iron deficiency anemia and B12 deficiency who is seen for 6 month assessment.  HPI: The patient was last seen in the hematology clinic for a telemedicine on 09/17/2019. At that time, she remained fatigued. She denied any bleeding. Hematocrit was 35.6, hemoglobin 10.8, MCV 70.6, platelets 366,000, WBC 9,500. Ferritin was 53. Patient did not receive Venofer.   She received Venofer on 12/17/2019 and 12/24/2019. She receives monthly B12 injections (10/08/2019 - 02/04/2020).   Patient was seen by Dr. Manuella Ghazi on 10/15/2019.   Labs followed: 10/08/2019: Hematocrit 35.9, hemoglobin 11.0, MCV 71.2, platelets 377,000, WBC 10,600. Ferritin was 51. Sed rate 13. 12/10/2019: Hematocrit 36.2, hemoglobin 10.9, MCV 71.0, platelets 381,000, WBC 11,000. Ferritin was 38. 02/25/2020: Hematocrit 36.7, hemoglobin 11.2, MCV 71.1, platelets 353,000, WBC 11,000. Ferritin was 76.  Iron saturation 11% with a TIBC of 337.   Symptomatically, she felt sick from what she ate the night before. She has been getting her B12 injections. She reports having carpal tunnel and peripheral neuropathy. She feels tired at times which she  attributes to work. Her weight is stable.    Past Medical History:  Diagnosis Date  . Anemia 2019   iron deficiency and b12 deficiency  . Anginal pain ([place]) 2019   with pregnancy. ekg fine. cardiology reviewed  . Anxiety   . Bipolar disorder ([place])   . Dyspnea    with pregnancy  . Dysrhythmia 2019   tachycardia with pregnancy and panic attacks  . GERD (gastroesophageal reflux disease)   . Hypertension 2019   with pregnancy  . Migraines    has not had as many recently.  at most, once a month  . Panic attacks   . Pica 2019   ice    Past Surgical History:  Procedure Laterality Date  . CESAREAN SECTION    . CESAREAN SECTION Bilateral 04/14/2018   Procedure: REPEAT CESAREAN SECTION WITH BTL;  Surgeon: [name], MD;  Location: [facility] ORS;  Service: Obstetrics;  Laterality: Bilateral;  Birth: 10:[birth year] Sex: Female Weight: 8 lbs 3 oz   . COLPOSCOPY  2006  . TUBAL LIGATION  2019    Family History  Problem Relation Age of Onset  . Cancer Paternal Uncle   . Cancer Maternal Grandfather   . Heart attack Mother 55  . Seizures Father   . Heart attack Paternal Grandfather     Social History:  reports that she has quit smoking. Her smoking use included cigarettes. She has never used smokeless tobacco. She reports previous drug use. Drug: Marijuana. She reports that she does not drink alcohol. Patient is originally from [place], [place]. She lives in [place]. Patient is unemployed. Patient denies known exposures to radiation on toxins. The patient is  alone today.  Participants in the patient's visit and their role in the encounter included the patient and Novella Olive, RN, today.  The intake visit was provided by Novella Olive, RN.    Allergies:  Allergies  Allergen Reactions  . Aspirin Nausea Only and Other (See Comments)    Makes stomach bleed and vomits blood  . Naproxen Nausea And Vomiting, Nausea Only and Other (See Comments)    Makes stomach bleed. Vomits blood    . Sumatriptan Succinate Other (See Comments)    Tingling over entire body. Painful tingling in jaw/mouth  . Lactose Intolerance (Gi) Other (See Comments)    Gi upset Can not take whole milk but ice cream and yogurt okay    Current Medications: Current Outpatient Medications  Medication Sig Dispense Refill  . ALPRAZolam (XANAX) 0.25 MG tablet Take 0.25 mg by mouth 3 (three) times daily.    . diphenhydrAMINE-PE-APAP 25-5-325 MG TABS Take 2 tablets by mouth daily as needed (for congestion).    Marland Kitchen doxepin (SINEQUAN) 25 MG capsule Take 25 mg by mouth at bedtime.  1  . lamoTRIgine (LAMICTAL) 200 MG tablet Take 400 mg by mouth daily.    Marland Kitchen lurasidone (LATUDA) 40 MG TABS tablet Take 40 mg by mouth daily with breakfast.    . omeprazole (PRILOSEC) 20 MG capsule Take 20 mg by mouth daily.      No current facility-administered medications for this visit.    Review of Systems  Constitutional: Positive for malaise/fatigue. Negative for chills, diaphoresis, fever and weight loss (stable).       Overall, feels fine.  Today, feeling sick from what she ate the night before.  HENT: Negative.  Negative for congestion, ear pain, nosebleeds, sinus pain and sore throat.   Eyes: Negative.  Negative for blurred vision, double vision and pain.  Respiratory: Negative.  Negative for cough, hemoptysis, sputum production and shortness of breath.   Cardiovascular: Negative.  Negative for chest pain, palpitations, orthopnea, leg swelling and PND.  Gastrointestinal: Negative for abdominal pain, blood in stool, constipation, diarrhea, heartburn, melena, nausea and vomiting.       Upset stomach, food related.  Genitourinary: Negative.  Negative for dysuria, frequency, hematuria and urgency.  Musculoskeletal: Negative.  Negative for back pain, falls, joint pain and myalgias.       Carpal tunnel.  Skin: Negative.  Negative for itching and rash.  Neurological: Positive for sensory change (peripheral neruopathy). Negative  for dizziness, tremors, speech change, focal weakness, weakness and headaches.       Carpal tunnel.  Endo/Heme/Allergies: Negative.  Does not bruise/bleed easily.  Psychiatric/Behavioral: Negative for depression, memory loss and suicidal ideas. The patient is not nervous/anxious and does not have insomnia.   All other systems reviewed and are negative.  Performance status (ECOG): 1  Physical Exam Nursing note reviewed.  Constitutional:      General: She is not in acute distress.    Appearance: She is well-developed. She is not diaphoretic.  HENT:     Head: Normocephalic and atraumatic.  Eyes:     General: No scleral icterus.    Extraocular Movements: Extraocular movements intact.     Pupils: Pupils are equal, round, and reactive to light.  Neurological:     Mental Status: She is alert and oriented to person, place, and time. Mental status is at baseline.  Psychiatric:        Mood and Affect: Mood normal.        Behavior: Behavior normal.  Thought Content: Thought content normal.        Judgment: Judgment normal.    No visits with results within 3 Day(s) from this visit.  Latest known visit with results is:  Appointment on 02/25/2020  Component Date Value Ref Range Status  . Iron 02/25/2020 37  28 - 170 ug/dL Final  . TIBC 02/25/2020 337  250 - 450 ug/dL Final  . Saturation Ratios 02/25/2020 11  10.4 - 31.8 % Final  . UIBC 02/25/2020 300  ug/dL Final   Performed at [facility], [address] [place], [place] [address]  . Ferritin 02/25/2020 76  11 - 307 ng/mL Final   Performed at [facility], [address] [place], [place] [address]  . WBC 02/25/2020 11.0* 4.0 - 10.5 K/uL Final  . RBC 02/25/2020 5.16* 3.87 - 5.11 MIL/uL Final  . Hemoglobin 02/25/2020 11.2* 12.0 - 15.0 g/dL Final  . HCT 02/25/2020 36.7  36 - 46 % Final  . MCV 02/25/2020 71.1* 80.0 - 100.0 fL Final  . MCH 02/25/2020 21.7* 26.0 - 34.0 pg Final  . MCHC 02/25/2020 30.5  30.0 -  36.0 g/dL Final  . RDW 02/25/2020 15.7* 11.5 - 15.5 % Final  . Platelets 02/25/2020 353  150 - 400 K/uL Final  . nRBC 02/25/2020 0.0  0.0 - 0.2 % Final  . Neutrophils Relative % 02/25/2020 65  % Final  . Neutro Abs 02/25/2020 7.2  1.7 - 7.7 K/uL Final  . Lymphocytes Relative 02/25/2020 27  % Final  . Lymphs Abs 02/25/2020 3.0  0.7 - 4.0 K/uL Final  . Monocytes Relative 02/25/2020 6  % Final  . Monocytes Absolute 02/25/2020 0.7  0 - 1 K/uL Final  . Eosinophils Relative 02/25/2020 2  % Final  . Eosinophils Absolute 02/25/2020 0.2  0 - 0 K/uL Final  . Basophils Relative 02/25/2020 0  % Final  . Basophils Absolute 02/25/2020 0.0  0 - 0 K/uL Final  . Immature Granulocytes 02/25/2020 0  % Final  . Abs Immature Granulocytes 02/25/2020 0.04  0.00 - 0.07 K/uL Final   Performed at [facility], [address] [place], [place] [address]    Assessment:  Dominique Dyer is a 39 y.o. female withiron deficiency anemia who returns to clinic for repeat evaluation. Dietappears good. She has ice pica. She denies any bleeding. She was on oral iron.  CBC on 01/23/2019revealed a hematocrit of 32.4, hemoglobin 9.9, and MCV 67. Ferritin was 13 with an iron saturation of 15% and a TIBC of 440 on 11/09/2017.  Labs on 04/03/2019revealed a hematocrit of 32.0, hemoglobin 10.2, and MCV 68.1. Ferritin was 6. Iron saturation 3% with a TIBC of 517. Reticulocytes 1%. B12level was low at 195. Folate normal at 13.9. TSH was 0.893 (normal) and free T4 0.56 (0.61-1.12) on 12/28/2017. BCR-ABL was negative on 01/25/2018.  Hemoglobin electrophoresison 02/22/2018 revealed 3.5% Hgb A2 (1.8-3.2%), and Hgb A 96.5%. 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  She underwent planned cesarean sectionwith bilateral tubal ligation on 04/14/2018.  She received Venoferweekly x 4 (02/08/2018 - 03/01/2018), x 3 (02/07/2019 - 02/28/2019), and x 2 (01/07/2020 and  02/04/2020).  Ferritinhas been followed: 13 on 11/09/2017, 6 on 12/21/2017, 12 on 01/24/2018, 11 on 02/07/2018, 197 on 03/06/2018, 26 on 05/03/2018, 33 on 08/01/2018, 32 on 11/08/2018, 26 on 02/01/2019, 55 on 06/06/2019, 53 on 09/10/2019, and 55 on 06/06/2019.   She has B12 deficiency. B12 was 195 on 12/21/2017  and 177 on 01/24/2018. She was on oral B12. She began B12 injectionson 01/25/2018 (last 02/04/2020). Folate was 6.3 on 02/22/2018 and 9.3 on 05/03/2018.  Symptomatically, she feels tired at work sometimes.  She notes some GI upset from what she ate the night before.  Plan: 1.   Review labs from 02/25/2020. 2.   Iron deficiency anemia Hematocrit 36.7.  Hemoglobin 11.2.  MCV 71.1 106/03/2020. Ferritin 76 with an iron saturation of 11% and a TIBC of 337. Ferritin goal remains at 100.  Review plan for IV iron if ferritin < 30 or symptomatic.  History of BTL with plan for urine pregnancy test prior to any iron transfusions.  No Venofer needed.  3.   B12 deficiency Please schedule B12 and monthly x 6 (last 02/04/2020). Check folate annually. 4.   RTC in 3 months (on day B12) for labs (CBC, ferritin, folate) and B12. 5.   RTC in 6 months (on day B12) for MD assessment, labs (CBC with diff, ferritin, iron studies-day before), B12 and +/- Venofer.  I discussed the assessment and treatment plan with the patient.  The patient was provided an opportunity to ask questions and all were answered.  The patient agreed with the plan and demonstrated an understanding of the instructions.  The patient was advised to call back if the symptoms worsen or if the condition fails to improve as anticipated.   Lequita Asal, MD, PhD    03/03/2020, 1:22 PM  I, Selena Batten, am acting as a scribe for Lequita Asal, MD.  I, Doctor Phillips Mike Gip, MD, have reviewed the above documentation for accuracy and completeness, and I agree with the above.

## 2020-03-03 ENCOUNTER — Encounter: Payer: Self-pay | Admitting: Hematology and Oncology

## 2020-03-03 ENCOUNTER — Inpatient Hospital Stay (HOSPITAL_BASED_OUTPATIENT_CLINIC_OR_DEPARTMENT_OTHER): Payer: Medicaid Other | Admitting: Hematology and Oncology

## 2020-03-03 ENCOUNTER — Other Ambulatory Visit: Payer: Self-pay

## 2020-03-03 ENCOUNTER — Ambulatory Visit: Payer: Medicaid Other

## 2020-03-03 DIAGNOSIS — E538 Deficiency of other specified B group vitamins: Secondary | ICD-10-CM | POA: Diagnosis not present

## 2020-03-03 DIAGNOSIS — Z79899 Other long term (current) drug therapy: Secondary | ICD-10-CM

## 2020-03-03 DIAGNOSIS — D509 Iron deficiency anemia, unspecified: Secondary | ICD-10-CM

## 2020-03-10 ENCOUNTER — Other Ambulatory Visit: Payer: Self-pay

## 2020-03-10 ENCOUNTER — Inpatient Hospital Stay: Payer: Medicaid Other

## 2020-03-10 DIAGNOSIS — D509 Iron deficiency anemia, unspecified: Secondary | ICD-10-CM

## 2020-03-10 DIAGNOSIS — E538 Deficiency of other specified B group vitamins: Secondary | ICD-10-CM | POA: Diagnosis not present

## 2020-03-10 MED ORDER — CYANOCOBALAMIN 1000 MCG/ML IJ SOLN
1000.0000 ug | Freq: Once | INTRAMUSCULAR | Status: AC
  Administered 2020-03-10: 1000 ug via INTRAMUSCULAR

## 2020-03-10 MED ORDER — IRON SUCROSE 20 MG/ML IV SOLN: 200.0000 mg | Freq: Once | INTRAVENOUS | Status: DC

## 2020-04-02 ENCOUNTER — Encounter: Payer: Medicaid Other | Admitting: Obstetrics and Gynecology

## 2020-04-07 ENCOUNTER — Inpatient Hospital Stay: Payer: Medicaid Other | Attending: Hematology and Oncology

## 2020-04-07 ENCOUNTER — Other Ambulatory Visit: Payer: Self-pay

## 2020-04-07 DIAGNOSIS — D509 Iron deficiency anemia, unspecified: Secondary | ICD-10-CM | POA: Diagnosis not present

## 2020-04-07 DIAGNOSIS — E538 Deficiency of other specified B group vitamins: Secondary | ICD-10-CM | POA: Diagnosis not present

## 2020-04-07 MED ORDER — CYANOCOBALAMIN 1000 MCG/ML IJ SOLN
INTRAMUSCULAR | Status: AC
  Filled 2020-04-07: qty 1

## 2020-04-07 MED ORDER — CYANOCOBALAMIN 1000 MCG/ML IJ SOLN
1000.0000 ug | Freq: Once | INTRAMUSCULAR | Status: AC
  Administered 2020-04-07: 1000 ug via INTRAMUSCULAR

## 2020-04-11 ENCOUNTER — Emergency Department: Payer: Medicaid Other

## 2020-04-11 ENCOUNTER — Encounter: Payer: Self-pay | Admitting: Emergency Medicine

## 2020-04-11 ENCOUNTER — Emergency Department
Admission: EM | Admit: 2020-04-11 | Discharge: 2020-04-11 | Disposition: A | Discharge status: Home / Self Care | Payer: Medicaid Other | Attending: Emergency Medicine | Admitting: Emergency Medicine

## 2020-04-11 ENCOUNTER — Other Ambulatory Visit: Payer: Self-pay

## 2020-04-11 DIAGNOSIS — R519 Headache, unspecified: Secondary | ICD-10-CM | POA: Diagnosis not present

## 2020-04-11 DIAGNOSIS — R109 Unspecified abdominal pain: Secondary | ICD-10-CM | POA: Diagnosis present

## 2020-04-11 DIAGNOSIS — R1031 Right lower quadrant pain: Secondary | ICD-10-CM | POA: Insufficient documentation

## 2020-04-11 DIAGNOSIS — R112 Nausea with vomiting, unspecified: Secondary | ICD-10-CM | POA: Insufficient documentation

## 2020-04-11 DIAGNOSIS — R102 Pelvic and perineal pain: Secondary | ICD-10-CM

## 2020-04-11 DIAGNOSIS — Z87891 Personal history of nicotine dependence: Secondary | ICD-10-CM | POA: Insufficient documentation

## 2020-04-11 DIAGNOSIS — I10 Essential (primary) hypertension: Secondary | ICD-10-CM | POA: Insufficient documentation

## 2020-04-11 DIAGNOSIS — Z79899 Other long term (current) drug therapy: Secondary | ICD-10-CM | POA: Insufficient documentation

## 2020-04-11 LAB — URINALYSIS, COMPLETE (UACMP) WITH MICROSCOPIC
Bacteria, UA: NONE SEEN
Bilirubin Urine: NEGATIVE
Glucose, UA: NEGATIVE mg/dL
Ketones, ur: NEGATIVE mg/dL
Leukocytes,Ua: NEGATIVE
Nitrite: NEGATIVE
Protein, ur: NEGATIVE mg/dL
Specific Gravity, Urine: 1.012 (ref 1.005–1.030)
pH: 6 (ref 5.0–8.0)

## 2020-04-11 LAB — POC URINE PREG, ED: Preg Test, Ur: NEGATIVE

## 2020-04-11 MED ORDER — TAMSULOSIN HCL 0.4 MG PO CAPS: 0.4000 mg | ORAL_CAPSULE | Freq: Every day | ORAL | 0 refills | Status: DC

## 2020-04-11 MED ORDER — CEPHALEXIN 500 MG PO CAPS
500.0000 mg | ORAL_CAPSULE | Freq: Three times a day (TID) | ORAL | 0 refills | Status: DC
Start: 2020-04-11 — End: 2020-05-19

## 2020-04-11 MED ORDER — KETOROLAC TROMETHAMINE 30 MG/ML IJ SOLN
30.0000 mg | Freq: Once | INTRAMUSCULAR | Status: AC
  Administered 2020-04-11: 30 mg via INTRAMUSCULAR
  Filled 2020-04-11: qty 1

## 2020-04-11 MED ORDER — HYDROCODONE-ACETAMINOPHEN 5-325 MG PO TABS: 1.0000 | ORAL_TABLET | Freq: Four times a day (QID) | ORAL | 0 refills | Status: DC | PRN

## 2020-04-11 NOTE — Discharge Instructions (Signed)
Follow-up with your regular doctor if not improving in 2 to 3 days. Return emergency department if worsening. Take your medications as prescribed.

## 2020-04-11 NOTE — ED Provider Notes (Signed)
[facility] Emergency Department Provider Note  ____________________________________________   First MD Initiated Contact with Patient 04/11/20 1242     (approximate)  I have reviewed the triage vital signs and the nursing notes.   HISTORY  Chief Complaint Urinary Frequency and Back Pain    HPI Dominique Dyer is a 39 y.o. female presents emergency department complaining of flank pain and a headache started 2 days ago.  States she noticed some blood on toilet paper when she urinated.  Some nausea/vomiting due to the pain.  States they considered that she had a kidney stone while back.  Denies fever or chills.    Past Medical History:  Diagnosis Date  . Anemia 2019   iron deficiency and b12 deficiency  . Anginal pain ([place]) 2019   with pregnancy. ekg fine. cardiology reviewed  . Anxiety   . Bipolar disorder ([place])   . Dyspnea    with pregnancy  . Dysrhythmia 2019   tachycardia with pregnancy and panic attacks  . GERD (gastroesophageal reflux disease)   . Hypertension 2019   with pregnancy  . Migraines    has not had as many recently.  at most, once a month  . Panic attacks   . Pica 2019   ice    Patient Active Problem List   Diagnosis Date Noted  . Delivery by cesarean section using transverse incision of lower segment of uterus 04/14/2018  . Carpal tunnel syndrome during pregnancy 03/28/2018  . Sinus tachycardia 03/08/2018  . Elevated blood pressure affecting pregnancy in third trimester, antepartum 03/08/2018  . Atypical chest pain 03/08/2018  . Shortness of breath 03/08/2018  . Obesity in pregnancy 03/01/2018  . Leucocytosis 02/08/2018  . B12 deficiency 02/01/2018  . Low vitamin B12 level 12/28/2017  . Goals of care, counseling/discussion 12/28/2017  . Iron deficiency anemia 11/16/2017  . History of anemia 11/09/2017  . Anemia of pregnancy in third trimester 11/09/2017  . H/O cesarean section complicating pregnancy 43/32/9518  .  Marijuana abuse 11/09/2017  . History of pregnancy induced hypertension 11/09/2017  . Advanced maternal age in multigravida 11/09/2017  . Atypical squamous cell changes of undetermined significance (ASCUS) on cervical cytology with positive high risk human papilloma virus (HPV) 11/09/2017    Past Surgical History:  Procedure Laterality Date  . CESAREAN SECTION    . CESAREAN SECTION Bilateral 04/14/2018   Procedure: REPEAT CESAREAN SECTION WITH BTL;  Surgeon: [name], MD;  Location: [facility] ORS;  Service: Obstetrics;  Laterality: Bilateral;  Birth: 10:45 Sex: Female Weight: 8 lbs 3 oz   . COLPOSCOPY  2006  . TUBAL LIGATION  2019    Prior to Admission medications   Medication Sig Start Date End Date Taking? Authorizing Provider  ALPRAZolam (XANAX) 0.25 MG tablet Take 0.25 mg by mouth 3 (three) times daily. 05/21/18   [provider]  cephALEXin (KEFLEX) 500 MG capsule Take 1 capsule (500 mg total) by mouth 3 (three) times daily. 04/11/20   Caryn Section Linden Dolin, PA-C  diphenhydrAMINE-PE-APAP 25-5-325 MG TABS Take 2 tablets by mouth daily as needed (for congestion).    [provider]  doxepin (SINEQUAN) 25 MG capsule Take 25 mg by mouth at bedtime. 07/18/18   [provider]  HYDROcodone-acetaminophen (NORCO/VICODIN) 5-325 MG tablet Take 1 tablet by mouth every 6 (six) hours as needed for moderate pain. 04/11/20   Pace Lamadrid, Linden Dolin, PA-C  lamoTRIgine (LAMICTAL) 200 MG tablet Take 400 mg by mouth daily.  [provider]  lurasidone (LATUDA) 40 MG TABS tablet Take 40 mg by mouth daily with breakfast.    [provider]  omeprazole (PRILOSEC) 20 MG capsule Take 20 mg by mouth daily.     [provider]  tamsulosin (FLOMAX) 0.4 MG CAPS capsule Take 1 capsule (0.4 mg total) by mouth daily. 04/11/20   Versie Starks, PA-C    Allergies Aspirin, Naproxen, Sumatriptan succinate, and Lactose intolerance (gi)  Family History  Problem Relation  Age of Onset  . Cancer Paternal Uncle   . Cancer Maternal Grandfather   . Heart attack Mother 4  . Seizures Father   . Heart attack Paternal Grandfather     Social History Social History   Tobacco Use  . Smoking status: Former Smoker    Types: Cigarettes  . Smokeless tobacco: Never Used  Vaping Use  . Vaping Use: Never used  Substance Use Topics  . Alcohol use: No  . Drug use: Not Currently    Types: Marijuana    Comment: last smoked 1 week ago    Review of Systems  Constitutional: No fever/chills Eyes: No visual changes. ENT: No sore throat. Respiratory: Denies cough Cardiovascular: Denies chest pain Gastrointestinal: Positive for flank pain Genitourinary: Negative for dysuria. Musculoskeletal: Negative for back pain. Skin: Negative for rash. Psychiatric: no mood changes,     ____________________________________________   PHYSICAL EXAM:  VITAL SIGNS: ED Triage Vitals  Enc Vitals Group     BP 04/11/20 1203 (!) 149/86     Pulse Rate 04/11/20 1203 65     Resp 04/11/20 1203 18     Temp 04/11/20 1203 98.4 F (36.9 C)     Temp Source 04/11/20 1203 Oral     SpO2 04/11/20 1203 99 %     Weight 04/11/20 1132 (!) 231 lb (104.8 kg)     Height 04/11/20 1132 5\' 4"  (1.626 m)     Head Circumference --      Peak Flow --      Pain Score 04/11/20 1132 7     Pain Loc --      Pain Edu? --      Excl. in [place]? --     Constitutional: Alert and oriented. Well appearing and in no acute distress. Eyes: Conjunctivae are normal.  Head: Atraumatic. Nose: No congestion/rhinnorhea. Mouth/Throat: Mucous membranes are moist.   Neck:  supple no lymphadenopathy noted Cardiovascular: Normal rate, regular rhythm. Heart sounds are normal Respiratory: Normal respiratory effort.  No retractions, lungs c t a  Abd: soft tender in the right lower quadrant, bs normal all 4 quad, positive for right-sided CVA tenderness GU: deferred Musculoskeletal: FROM all extremities, warm and well  perfused Neurologic:  Normal speech and language.  Skin:  Skin is warm, dry and intact. No rash noted. Psychiatric: Mood and affect are normal. Speech and behavior are normal.  ____________________________________________   LABS (all labs ordered are listed, but only abnormal results are displayed)  Labs Reviewed  URINALYSIS, COMPLETE (UACMP) WITH MICROSCOPIC - Abnormal; Notable for the following components:      Result Value   Color, Urine YELLOW (*)    APPearance CLEAR (*)    Hgb urine dipstick MODERATE (*)    All other components within normal limits  POC URINE PREG, ED   ____________________________________________   ____________________________________________  RADIOLOGY  CT renal stone study is negative except for migration of tubal ligation clips Ultrasound pelvis is negative ____________________________________________   PROCEDURES  Procedure(s) performed:  No  Procedures    ____________________________________________   INITIAL IMPRESSION / ASSESSMENT AND PLAN / ED COURSE  Pertinent labs & imaging results that were available during my care of the patient were reviewed by me and considered in my medical decision making (see chart for details).   Patient is a 39 year old female presents emergency department with right-sided flank pain and dysuria.  See HPI  Physical exam shows patient to appear well.  Abdomen is tender in the right lower quadrant.  DDx: Acute appendicitis, acute kidney stone, acute ovarian cyst  CT ruled out appendicitis and kidney stone.  No ovarian cyst on ultrasound.  Explained the findings to the patient.  Her urine has turned pink since she has been here in the ED.  Do feel that she may have a kidney stone that is just not showing up on CT.  We will treat her as a kidney stone and also cover her for infection.  Explained to her that she may want to follow-up with her regular GYN doctor about the clips and the migration from the right  side to the left side.  Do not feel there is any concern although if she is worried she should talk to him.  Also she may want to discuss whether or not her bladder has dropped with her GYN doctor.  This could cause some of the urinary frequency that she has.  She states she understands.  She was discharged stable condition with a prescription for Keflex, Flomax, and Vicodin.      As part of my medical decision making, I reviewed the following data within the [place] notes reviewed and incorporated, Labs reviewed , Old chart reviewed, Radiograph reviewed , Notes from prior ED visits and  Controlled Substance Database  ____________________________________________   FINAL CLINICAL IMPRESSION(S) / ED DIAGNOSES  Final diagnoses:  RLQ abdominal pain  Flank pain      NEW MEDICATIONS STARTED DURING THIS VISIT:  Discharge Medication List as of 04/11/2020  4:51 PM    START taking these medications   Details  cephALEXin (KEFLEX) 500 MG capsule Take 1 capsule (500 mg total) by mouth 3 (three) times daily., Starting Fri 04/11/2020, Normal    HYDROcodone-acetaminophen (NORCO/VICODIN) 5-325 MG tablet Take 1 tablet by mouth every 6 (six) hours as needed for moderate pain., Starting Fri 04/11/2020, Normal    tamsulosin (FLOMAX) 0.4 MG CAPS capsule Take 1 capsule (0.4 mg total) by mouth daily., Starting Fri 04/11/2020, Normal         Note:  This document was prepared using Dragon voice recognition software and may include unintentional dictation errors.    Versie Starks, PA-C 04/11/20 1718    Carrie Mew, MD 04/12/20 [phone]

## 2020-04-11 NOTE — ED Triage Notes (Signed)
Pt reports urinary urgency, frequency and now with back pain to right side for several days.

## 2020-04-11 NOTE — ED Notes (Signed)
See triage note  Presents with lower back pain  Stats she had a migraine couple of days ago  Then developed lower back pain with n/v  States pain is worse on the right at the flank area

## 2020-04-11 NOTE — ED Notes (Signed)
Back from U/S   Awaiting results  Has been up to bathroom freq

## 2020-04-14 ENCOUNTER — Telehealth: Payer: Self-pay | Admitting: Obstetrics and Gynecology

## 2020-04-14 NOTE — Telephone Encounter (Signed)
Pt called in and stated that she went to the ED on the 23rd, the pt stated that she was told to call and let Dr. Amalia Hailey know that they did a CT scan on her. The pt stated that her tubal ligation on the left has moved to the right. Its been causing her a lot of pain. The pt also stated that she is peeing blood.  The pt is requesting a call back. Please advise

## 2020-04-14 NOTE — Telephone Encounter (Signed)
Please advise 

## 2020-04-15 NOTE — Telephone Encounter (Signed)
LM for patient to return call to schedule appointment.

## 2020-04-22 ENCOUNTER — Ambulatory Visit (INDEPENDENT_AMBULATORY_CARE_PROVIDER_SITE_OTHER): Payer: Medicaid Other | Admitting: Obstetrics and Gynecology

## 2020-04-22 ENCOUNTER — Encounter: Payer: Self-pay | Admitting: Obstetrics and Gynecology

## 2020-04-22 VITALS — BP 105/70 | HR 111 | Ht 64.0 in | Wt 240.1 lb

## 2020-04-22 DIAGNOSIS — R1031 Right lower quadrant pain: Secondary | ICD-10-CM | POA: Diagnosis not present

## 2020-04-22 DIAGNOSIS — K439 Ventral hernia without obstruction or gangrene: Secondary | ICD-10-CM | POA: Diagnosis not present

## 2020-04-22 NOTE — Progress Notes (Signed)
HPI:      [name] is a 39 y.o. G2P1001 who LMP was Patient's last menstrual period was 04/05/2020 (exact date).  Subjective:   She presents today after being seen in the emergency department for right lower quadrant pain that is worse with exertion, heavy lifting, getting in and out of the car. Patient recently was in the emergency department and was found to have a normal CT and normal ultrasound with the exception of the possibility of " migration" of one of her Filshie clips.  She has now read about this on the Internet and has some concern regarding this possible migration especially if it relates to her pain.    Hx: The following portions of the patient's history were reviewed and updated as appropriate:             She  has a past medical history of Anemia (2019), Anginal pain ([place]) (2019), Anxiety, Bipolar disorder ([place]), Dyspnea, Dysrhythmia (2019), GERD (gastroesophageal reflux disease), Hypertension (2019), Migraines, Panic attacks, and Pica (2019). She does not have any pertinent problems on file. She  has a past surgical history that includes Cesarean section; Colposcopy (2006); Cesarean section (Bilateral, 04/14/2018); and Tubal ligation (2019). Her family history includes Cancer in her maternal grandfather and paternal uncle; Heart attack in her paternal grandfather; Heart attack (age of onset: 67) in her mother; Seizures in her father. She  reports that she has quit smoking. Her smoking use included cigarettes. She has never used smokeless tobacco. She reports previous drug use. Drug: Marijuana. She reports that she does not drink alcohol. She has a current medication list which includes the following prescription(s): alprazolam, cephalexin, diphenhydramine-pe-apap, doxepin, lamotrigine, lurasidone, omeprazole, and tamsulosin. She is allergic to aspirin, naproxen, sumatriptan succinate, and lactose intolerance (gi).       Review of Systems:  Review of  Systems  Constitutional: Denied constitutional symptoms, night sweats, recent illness, fatigue, fever, insomnia and weight loss.  Eyes: Denied eye symptoms, eye pain, photophobia, vision change and visual disturbance.  Ears/Nose/Throat/Neck: Denied ear, nose, throat or neck symptoms, hearing loss, nasal discharge, sinus congestion and sore throat.  Cardiovascular: Denied cardiovascular symptoms, arrhythmia, chest pain/pressure, edema, exercise intolerance, orthopnea and palpitations.  Respiratory: Denied pulmonary symptoms, asthma, pleuritic pain, productive sputum, cough, dyspnea and wheezing.  Gastrointestinal: Denied, gastro-esophageal reflux, melena, nausea and vomiting.  Genitourinary: Denied genitourinary symptoms including symptomatic vaginal discharge, pelvic relaxation issues, and urinary complaints.  Musculoskeletal: Denied musculoskeletal symptoms, stiffness, swelling, muscle weakness and myalgia.  Dermatologic: Denied dermatology symptoms, rash and scar.  Neurologic: Denied neurology symptoms, dizziness, headache, neck pain and syncope.  Psychiatric: Denied psychiatric symptoms, anxiety and depression.  Endocrine: Denied endocrine symptoms including hot flashes and night sweats.   Meds:   Current Outpatient Medications on File Prior to Visit  Medication Sig Dispense Refill  . ALPRAZolam (XANAX) 0.25 MG tablet Take 0.25 mg by mouth 3 (three) times daily.    . cephALEXin (KEFLEX) 500 MG capsule Take 1 capsule (500 mg total) by mouth 3 (three) times daily. 21 capsule 0  . diphenhydrAMINE-PE-APAP 25-5-325 MG TABS Take 2 tablets by mouth daily as needed (for congestion).    Marland Kitchen doxepin (SINEQUAN) 25 MG capsule Take 25 mg by mouth at bedtime.  1  . lamoTRIgine (LAMICTAL) 200 MG tablet Take 400 mg by mouth daily.    Marland Kitchen lurasidone (LATUDA) 40 MG TABS tablet Take 40 mg by mouth daily with breakfast.    . omeprazole (PRILOSEC) 20 MG capsule Take  20 mg by mouth daily.     . tamsulosin  (FLOMAX) 0.4 MG CAPS capsule Take 1 capsule (0.4 mg total) by mouth daily. 30 capsule 0   No current facility-administered medications on file prior to visit.    Objective:     Vitals:   04/22/20 1504  BP: 105/70  Pulse: (!) 111              Abdominal examination reveals pain in the right lower quadrant.  Pain is of the abdominal wall only not related to abdominal contents.  Small bulge noted with Valsalva at the area of pain.  Assessment:    G2P1001 Patient Active Problem List   Diagnosis Date Noted  . Delivery by cesarean section using transverse incision of lower segment of uterus 04/14/2018  . Carpal tunnel syndrome during pregnancy 03/28/2018  . Sinus tachycardia 03/08/2018  . Elevated blood pressure affecting pregnancy in third trimester, antepartum 03/08/2018  . Atypical chest pain 03/08/2018  . Shortness of breath 03/08/2018  . Obesity in pregnancy 03/01/2018  . Leucocytosis 02/08/2018  . B12 deficiency 02/01/2018  . Low vitamin B12 level 12/28/2017  . Goals of care, counseling/discussion 12/28/2017  . Iron deficiency anemia 11/16/2017  . History of anemia 11/09/2017  . Anemia of pregnancy in third trimester 11/09/2017  . H/O cesarean section complicating pregnancy 56/97/9480  . Marijuana abuse 11/09/2017  . History of pregnancy induced hypertension 11/09/2017  . Advanced maternal age in multigravida 11/09/2017  . Atypical squamous cell changes of undetermined significance (ASCUS) on cervical cytology with positive high risk human papilloma virus (HPV) 11/09/2017     1. Abdominal wall pain in right lower quadrant   2. Abdominal wall hernia     Pain likely secondary to small abdominal wall hernia.  Patient not at risk for bowel herniation/strangulation at this time.  Possible migration of Filshie clip.  We have discussed this in some detail.  It is very unlikely that the clip is causing any problems for her related to her pain.  It is unlikely the clip will cause  any future issues.   Plan:            1.  Expectant management of abdominal wall hernia.  Patient to consider surgical referral/intervention should her pain limit her activities.  2.  We have discussed Filshie clip migration and I have offered to perform further work-up including possible laparoscopy with removal of this clip if she so desires.  She has declined this option at this time.   Orders No orders of the defined types were placed in this encounter.   No orders of the defined types were placed in this encounter.     F/U  Return for Annual Physical. I spent 31 minutes involved in the care of this patient preparing to see the patient by obtaining and reviewing her medical history (including labs, imaging tests and prior procedures), documenting clinical information in the electronic health record (EHR), counseling and coordinating care plans, writing and sending prescriptions, ordering tests or procedures and directly communicating with the patient by discussing pertinent items from her history and physical exam as well as detailing my assessment and plan as noted above so that she has an informed understanding.  All of her questions were answered.  Finis Bud, M.D. 04/22/2020 3:32 PM

## 2020-05-05 ENCOUNTER — Inpatient Hospital Stay: Payer: Medicaid Other | Attending: Hematology and Oncology

## 2020-05-05 DIAGNOSIS — G4700 Insomnia, unspecified: Secondary | ICD-10-CM | POA: Insufficient documentation

## 2020-05-14 ENCOUNTER — Encounter: Payer: Medicaid Other | Admitting: Obstetrics and Gynecology

## 2020-05-15 ENCOUNTER — Ambulatory Visit: Payer: Self-pay | Admitting: General Surgery

## 2020-05-19 ENCOUNTER — Encounter: Payer: Self-pay | Admitting: Surgery

## 2020-05-19 ENCOUNTER — Other Ambulatory Visit: Payer: Self-pay

## 2020-05-19 ENCOUNTER — Ambulatory Visit (INDEPENDENT_AMBULATORY_CARE_PROVIDER_SITE_OTHER): Payer: Medicaid Other | Admitting: Surgery

## 2020-05-19 VITALS — BP 117/79 | HR 90 | Temp 99.0°F | Resp 14 | Ht 64.0 in | Wt 229.0 lb

## 2020-05-19 DIAGNOSIS — K429 Umbilical hernia without obstruction or gangrene: Secondary | ICD-10-CM | POA: Diagnosis not present

## 2020-05-19 DIAGNOSIS — R87619 Unspecified abnormal cytological findings in specimens from cervix uteri: Secondary | ICD-10-CM | POA: Insufficient documentation

## 2020-05-19 DIAGNOSIS — F410 Panic disorder [episodic paroxysmal anxiety] without agoraphobia: Secondary | ICD-10-CM | POA: Insufficient documentation

## 2020-05-19 DIAGNOSIS — F319 Bipolar disorder, unspecified: Secondary | ICD-10-CM | POA: Insufficient documentation

## 2020-05-19 DIAGNOSIS — I10 Essential (primary) hypertension: Secondary | ICD-10-CM | POA: Insufficient documentation

## 2020-05-19 DIAGNOSIS — D171 Benign lipomatous neoplasm of skin and subcutaneous tissue of trunk: Secondary | ICD-10-CM | POA: Diagnosis not present

## 2020-05-19 DIAGNOSIS — K219 Gastro-esophageal reflux disease without esophagitis: Secondary | ICD-10-CM | POA: Insufficient documentation

## 2020-05-19 DIAGNOSIS — F419 Anxiety disorder, unspecified: Secondary | ICD-10-CM | POA: Insufficient documentation

## 2020-05-19 NOTE — Progress Notes (Signed)
05/19/2020  Reason for Visit:  Umbilical hernia, right lower quadrant abdominal pain.  Referring Provider:  Johny Drilling, MD  History of Present Illness: Dominique Dyer is a 39 y.o. female presenting for evaluation of chronic umbilical hernia associated with right lower quadrant abdominal pain.  Patient presented to the emergency room on 04/11/2020 with right lower quadrant abdominal pain and her initial suspicion was that she could be having acute appendicitis.  She had a CT scan in the emergency department which did not reveal any intra-abdominal pathology except for a small umbilical hernia.  CT scan did show potentially that her previous right tubal ligation clip may have migrated towards the left side.  On pelvic ultrasound, her right ovary was not visualized.  She was seen by Dr. Amalia Hailey as an outpatient and it was determined that this issue with the clip was not resulting in her right lower quadrant abdominal pain.  She was also diagnosed in emergency department with a possible UTI and was given antibiotics for that.  Today, she reports that she still having pain in the right lower quadrant although is not as severe as it has been when she went to the emergency room.  She reports that it is about a 3 out of 10.  She reports that when the pain was more severe, she was having nausea and vomiting as well because of the pain.  She has not had much appetite because of that either and has lost about 3 pounds.  In the right lower quadrant, she does not feel a bulging mass or defect, but she feels a small ball of tissue.  The pain does not radiate towards her left that is mostly localized to the right lower quadrant.  Notes that the pain is more significant when she is sitting up she feels that she squeezes that area more and eases up when she is lying down.  Patient has had 2 C-sections and a tubal ligation that was done during her last C-section.  Past Medical History: Past Medical History:   Diagnosis Date  . Anemia 2019   iron deficiency and b12 deficiency  . Anginal pain ([place]) 2019   with pregnancy. ekg fine. cardiology reviewed  . Anxiety   . Bipolar disorder ([place])   . Dyspnea    with pregnancy  . Dysrhythmia 2019   tachycardia with pregnancy and panic attacks  . GERD (gastroesophageal reflux disease)   . Hypertension 2019   with pregnancy  . Migraines    has not had as many recently.  at most, once a month  . Panic attacks   . Pica 2019   ice     Past Surgical History: Past Surgical History:  Procedure Laterality Date  . CESAREAN SECTION    . CESAREAN SECTION Bilateral 04/14/2018   Procedure: REPEAT CESAREAN SECTION WITH BTL;  Surgeon: [name], MD;  Location: [facility] ORS;  Service: Obstetrics;  Laterality: Bilateral;  Birth: 10:45 Sex: Female Weight: 8 lbs 3 oz   . COLPOSCOPY  2006  . TUBAL LIGATION  2019    Home Medications: Prior to Admission medications   Medication Sig Start Date End Date Taking? Authorizing Provider  ALPRAZolam (XANAX) 0.25 MG tablet Take 0.25 mg by mouth 3 (three) times daily. 05/21/18  Yes [provider]  diphenhydrAMINE-PE-APAP 25-5-325 MG TABS Take 2 tablets by mouth daily as needed (for congestion).   Yes [provider]  doxepin (SINEQUAN) 25 MG capsule Take 25 mg by mouth at  bedtime. 07/18/18  Yes [provider]  lamoTRIgine (LAMICTAL) 200 MG tablet Take 400 mg by mouth daily.   Yes [provider]  lurasidone (LATUDA) 40 MG TABS tablet Take 40 mg by mouth daily with breakfast.   Yes [provider]  omeprazole (PRILOSEC) 20 MG capsule Take 20 mg by mouth daily.    Yes [provider]  oxybutynin (DITROPAN) 5 MG tablet Take 5 mg by mouth 3 (three) times daily.   Yes [provider]    Allergies: Allergies  Allergen Reactions  . Aspirin Nausea Only and Other (See Comments)    Makes stomach bleed and vomits blood  . Naproxen Nausea And Vomiting, Nausea  Only and Other (See Comments)    Makes stomach bleed. Vomits blood  . Sumatriptan Succinate Other (See Comments)    Tingling over entire body. Painful tingling in jaw/mouth  . Lactose Intolerance (Gi) Other (See Comments)    Gi upset Can not take whole milk but ice cream and yogurt okay    Social History:  reports that she has quit smoking. Her smoking use included cigarettes. She has never used smokeless tobacco. She reports previous drug use. Drug: Marijuana. She reports that she does not drink alcohol.   Family History: Family History  Problem Relation Age of Onset  . Cancer Paternal Uncle   . Cancer Maternal Grandfather   . Heart attack Mother 99  . Seizures Father   . Heart attack Paternal Grandfather     Review of Systems: Review of Systems  Constitutional: Negative for chills and fever.  HENT: Negative for hearing loss.   Respiratory: Negative for shortness of breath.   Cardiovascular: Negative for chest pain.  Gastrointestinal: Positive for abdominal pain, nausea and vomiting. Negative for constipation and diarrhea.  Genitourinary: Negative for dysuria.  Musculoskeletal: Negative for myalgias.  Skin: Negative for rash.  Neurological: Negative for dizziness.  Psychiatric/Behavioral: Negative for depression.    Physical Exam BP 117/79   Pulse 90   Temp 99 F (37.2 C) (Oral)   Resp 14   Ht 5\' 4"  (1.626 m)   Wt 229 lb (103.9 kg)   SpO2 93%   BMI 39.31 kg/m  CONSTITUTIONAL: No acute distress HEENT:  Normocephalic, atraumatic, extraocular motion intact. NECK: Trachea is midline, and there is no jugular venous distension.  RESPIRATORY:  Lungs are clear, and breath sounds are equal bilaterally. Normal respiratory effort without pathologic use of accessory muscles. CARDIOVASCULAR: Heart is regular without murmurs, gallops, or rubs. GI: The abdomen is soft, obese, nondistended, with focal points of tenderness in the right lower quadrant as well as umbilicus.  The  patient does have a small umbilical hernia which is tender to palpation.  She reports that upon pressing at her bellybutton, there is some pain that radiates towards her right lower quadrant.  However she does mention 2 spots in the right lower quadrant abdominal wall that have focal tenderness.  On the most lateral one, I am able to palpate a small mass that is likely a lipoma.  I do not feel any hernia defects other than the umbilical hernia.  MUSCULOSKELETAL:  Normal muscle strength and tone in all four extremities.  No peripheral edema or cyanosis. SKIN: Skin turgor is normal. There are no pathologic skin lesions.  NEUROLOGIC:  Motor and sensation is grossly normal.  Cranial nerves are grossly intact. PSYCH:  Alert and oriented to person, place and time. Affect is normal.  Laboratory Analysis: Urinalysis on 04/11/2020 was  negative for UTI.  Imaging: CT scan abdomen pelvis on 04/11/2020: FINDINGS: Lower chest: No significant pulmonary nodules or acute consolidative airspace disease. Hepatobiliary: Normal liver size. No liver mass. Normal gallbladder with no radiopaque cholelithiasis. No biliary ductal dilatation. Pancreas: Normal, with no mass or duct dilation. Spleen: Normal size. No mass. Adrenals/Urinary Tract: Normal adrenals. No renal stones. No hydronephrosis. No contour deforming renal masses. Normal caliber ureters. No ureteral stones. Normal nondistended bladder. Stomach/Bowel: Normal non-distended stomach. Normal caliber small bowel with no small bowel wall thickening. Normal appendix. Normal large bowel with no diverticulosis, large bowel wall thickening or pericolonic fat stranding. Vascular/Lymphatic: Normal caliber abdominal aorta. No pathologically enlarged lymph nodes in the abdomen or pelvis. Reproductive: Normal uterus. No adnexal masses. There are two tubal ligation clips in the left adnexal region (series 2/images 79 and 83). Other: No pneumoperitoneum, ascites or  focal fluid collection. Tiny fat containing umbilical hernia is stable. Musculoskeletal: No aggressive appearing focal osseous lesions.  IMPRESSION: 1. No acute abnormality. No urolithiasis. No hydronephrosis. 2. Two tubal ligation clips in the left adnexal region, one of which may represent a migrated right adnexal tubal ligation clip.  Assessment and Plan: This is a 39 y.o. female with an umbilical hernia as well as potentially 2 small lipomas.  -Discussed with the patient that she does have tenderness to palpation at the umbilical hernia defect, and we can help place with an umbilical hernia repair.  I think I am able to palpate a small lipoma in one of the 2 spots of tenderness in the right lower quadrant abdominal wall.  However it is hard to be certain given her body habitus.  Discussed with her diet we could also proceed with 2 incisions to try to remove those 2 areas but that I could not guarantee that this will resolve her right lower quadrant abdominal pain.  There is otherwise no other pathology noted on CT scan or on exam today that could contribute to this.  She is willing to proceed.  Discussed with her the risks of bleeding, infection, injury to surrounding structures.  Discussed with her postoperative recovery time with no heavy lifting or pushing no more than 10 to 15 pounds for period of 4 to 6 weeks. -Patient will discuss this with her job and her boss to see when it would be at this time to have her surgery.  She will then call us back so that we can set up a date and time.  Face-to-face time spent with the patient and care providers was 60 minutes, with more than 50% of the time spent counseling, educating, and coordinating care of the patient.     Melvyn Neth, [facility]

## 2020-05-19 NOTE — Patient Instructions (Addendum)
Check with your employer and call our office if you decide to move forward with having the surgery. I have provided the [facility] surgery sheet. Please have it available when speaking with the surgery scheduler.      Umbilical Hernia, Adult  A hernia is a bulge of tissue that pushes through an opening between muscles. An umbilical hernia happens in the abdomen, near the belly button (umbilicus). The hernia may contain tissues from the small intestine, large intestine, or fatty tissue covering the intestines (omentum). Umbilical hernias in adults tend to get worse over time, and they require surgical treatment. There are several types of umbilical hernias. You may have:  A hernia located just above or below the umbilicus (indirect hernia). This is the most common type of umbilical hernia in adults.  A hernia that forms through an opening formed by the umbilicus (direct hernia).  A hernia that comes and goes (reducible hernia). A reducible hernia may be visible only when you strain, lift something heavy, or cough. This type of hernia can be pushed back into the abdomen (reduced).  A hernia that traps abdominal tissue inside the hernia (incarcerated hernia). This type of hernia cannot be reduced.  A hernia that cuts off blood flow to the tissues inside the hernia (strangulated hernia). The tissues can start to die if this happens. This type of hernia requires emergency treatment. What are the causes? An umbilical hernia happens when tissue inside the abdomen presses on a weak area of the abdominal muscles. What increases the risk? You may have a greater risk of this condition if you:  Are obese.  Have had several pregnancies.  Have a buildup of fluid inside your abdomen (ascites).  Have had surgery that weakens the abdominal muscles. What are the signs or symptoms? The main symptom of this condition is a painless bulge at or near the belly button. A reducible hernia may be visible only when you  strain, lift something heavy, or cough. Other symptoms may include:  Dull pain.  A feeling of pressure. Symptoms of a strangulated hernia may include:  Pain that gets increasingly worse.  Nausea and vomiting.  Pain when pressing on the hernia.  Skin over the hernia becoming red or purple.  Constipation.  Blood in the stool. How is this diagnosed? This condition may be diagnosed based on:  A physical exam. You may be asked to cough or strain while standing. These actions increase the pressure inside your abdomen and force the hernia through the opening in your muscles. Your health care provider may try to reduce the hernia by pressing on it.  Your symptoms and medical history. How is this treated? Surgery is the only treatment for an umbilical hernia. Surgery for a strangulated hernia is done as soon as possible. If you have a small hernia that is not incarcerated, you may need to lose weight before having surgery. Follow these instructions at home:  Lose weight, if told by your health care provider.  Do not try to push the hernia back in.  Watch your hernia for any changes in color or size. Tell your health care provider if any changes occur.  You may need to avoid activities that increase pressure on your hernia.  Do not lift anything that is heavier than 10 lb (4.5 kg) until your health care provider says that this is safe.  Take over-the-counter and prescription medicines only as told by your health care provider.  Keep all follow-up visits as told by  your health care provider. This is important. Contact a health care provider if:  Your hernia gets larger.  Your hernia becomes painful. Get help right away if:  You develop sudden, severe pain near the area of your hernia.  You have pain as well as nausea or vomiting.  You have pain and the skin over your hernia changes color.  You develop a fever. This information is not intended to replace advice given to you  by your health care provider. Make sure you discuss any questions you have with your health care provider. Document Revised: 10/19/2017 Document Reviewed: 03/07/2017 Elsevier Patient Education  [place].

## 2020-05-20 ENCOUNTER — Telehealth: Payer: Self-pay | Admitting: Surgery

## 2020-05-20 NOTE — Telephone Encounter (Signed)
Spoke with patient regarding some surgery dates.  She wants to speak with her boss at work and will then decide what she wants to do with proceeding forward.

## 2020-05-22 ENCOUNTER — Encounter: Payer: Medicaid Other | Admitting: Obstetrics and Gynecology

## 2020-05-28 ENCOUNTER — Telehealth: Payer: Self-pay | Admitting: Surgery

## 2020-05-28 NOTE — Telephone Encounter (Signed)
Outgoing call is made again, left message for patient to call regarding scheduling surgery.

## 2020-05-29 ENCOUNTER — Encounter: Payer: Self-pay | Admitting: Obstetrics and Gynecology

## 2020-06-02 ENCOUNTER — Inpatient Hospital Stay: Payer: Medicaid Other | Attending: Hematology and Oncology

## 2020-06-02 ENCOUNTER — Other Ambulatory Visit: Payer: Self-pay

## 2020-06-02 ENCOUNTER — Inpatient Hospital Stay: Payer: Medicaid Other

## 2020-06-02 DIAGNOSIS — D509 Iron deficiency anemia, unspecified: Secondary | ICD-10-CM

## 2020-06-02 DIAGNOSIS — E538 Deficiency of other specified B group vitamins: Secondary | ICD-10-CM | POA: Diagnosis not present

## 2020-06-02 LAB — CBC
HCT: 38.3 % (ref 36.0–46.0)
Hemoglobin: 11.9 g/dL — ABNORMAL LOW (ref 12.0–15.0)
MCH: 21.8 pg — ABNORMAL LOW (ref 26.0–34.0)
MCHC: 31.1 g/dL (ref 30.0–36.0)
MCV: 70.3 fL — ABNORMAL LOW (ref 80.0–100.0)
Platelets: 405 10*3/uL — ABNORMAL HIGH (ref 150–400)
RBC: 5.45 MIL/uL — ABNORMAL HIGH (ref 3.87–5.11)
RDW: 14.7 % (ref 11.5–15.5)
WBC: 9.7 10*3/uL (ref 4.0–10.5)
nRBC: 0 % (ref 0.0–0.2)

## 2020-06-02 LAB — FOLATE: Folate: 9.2 ng/mL (ref >5.9)

## 2020-06-02 LAB — FERRITIN: Ferritin: 80 ng/mL (ref 11–307)

## 2020-06-02 MED ORDER — CYANOCOBALAMIN 1000 MCG/ML IJ SOLN
1000.0000 ug | Freq: Once | INTRAMUSCULAR | Status: AC
  Administered 2020-06-02: 1000 ug via INTRAMUSCULAR
  Filled 2020-06-02: qty 1

## 2020-06-30 ENCOUNTER — Telehealth: Payer: Self-pay | Admitting: Surgery

## 2020-06-30 ENCOUNTER — Inpatient Hospital Stay: Payer: Medicaid Other | Attending: Hematology and Oncology

## 2020-06-30 NOTE — Telephone Encounter (Signed)
Back when last spoke with patient on 05/20/20 she could not schedule surgery due to work schedule.  On 05/28/20 I called the patient back to check on status of scheduling.  At that time the patient states that they had a severe staffing shortage and just would not be able to schedule surgery.  Today (06/30/20) I called the patient back to see what she wanted to do.  At this time, she will see Dr. Hampton Abbot first since it has been 05/19/20 since last seen and patient also requesting the possibility of imaging to be done first prior to possible surgery.  She is scheduled to see Dr. Hampton Abbot on 07/11/20 at her request.

## 2020-07-11 ENCOUNTER — Other Ambulatory Visit: Payer: Self-pay

## 2020-07-11 ENCOUNTER — Encounter: Payer: Self-pay | Admitting: Surgery

## 2020-07-11 ENCOUNTER — Ambulatory Visit (INDEPENDENT_AMBULATORY_CARE_PROVIDER_SITE_OTHER): Payer: Medicaid Other | Admitting: Surgery

## 2020-07-11 VITALS — BP 108/72 | HR 84 | Temp 98.3°F | Ht 64.0 in | Wt 215.2 lb

## 2020-07-11 DIAGNOSIS — K429 Umbilical hernia without obstruction or gangrene: Secondary | ICD-10-CM

## 2020-07-11 DIAGNOSIS — D171 Benign lipomatous neoplasm of skin and subcutaneous tissue of trunk: Secondary | ICD-10-CM | POA: Diagnosis not present

## 2020-07-11 NOTE — Progress Notes (Signed)
07/11/2020  History of Present Illness: Dominique Dyer is a 39 y.o. female presenting for follow up of umbilical hernia and RLQ abdominal wall lipoma.  She was last seen on 8/30, at which time she was going to check with work about when to schedule surgery.  The patient reports that right now the umbilical hernia is not bothering her at all, but what does bother her is the RLQ area.  She reports that depending on how she moves, she feels an area of pain near the right hip (she points to her ASIS).  She's not sure that it's larger in size, but she gets about a 4 out of 10 pain when it flares up.  From the umbilical hernia standpoint, she does not notice any worsening size or bulging, and denies any day to day pain.  Past Medical History: Past Medical History:  Diagnosis Date  . Anemia 2019   iron deficiency and b12 deficiency  . Anginal pain ([place]) 2019   with pregnancy. ekg fine. cardiology reviewed  . Anxiety   . Bipolar disorder ([place])   . Dyspnea    with pregnancy  . Dysrhythmia 2019   tachycardia with pregnancy and panic attacks  . GERD (gastroesophageal reflux disease)   . Hypertension 2019   with pregnancy  . Migraines    has not had as many recently.  at most, once a month  . Panic attacks   . Pica 2019   ice     Past Surgical History: Past Surgical History:  Procedure Laterality Date  . CESAREAN SECTION    . CESAREAN SECTION Bilateral 04/14/2018   Procedure: REPEAT CESAREAN SECTION WITH BTL;  Surgeon: [name], MD;  Location: [facility] ORS;  Service: Obstetrics;  Laterality: Bilateral;  Birth: 10:[birth year] Sex: Female Weight: 8 lbs 3 oz   . COLPOSCOPY  2006  . TUBAL LIGATION  2019    Home Medications: Prior to Admission medications   Medication Sig Start Date End Date Taking? Authorizing Provider  ALPRAZolam Duanne Moron) 0.5 MG tablet Take 0.5 mg by mouth 3 (three) times daily as needed. 07/06/20  Yes [provider]  diphenhydrAMINE-PE-APAP 25-5-325 MG TABS  Take 2 tablets by mouth daily as needed (for congestion).   Yes [provider]  gabapentin (NEURONTIN) 100 MG capsule  07/02/20  Yes [provider]  lamoTRIgine (LAMICTAL) 200 MG tablet Take 400 mg by mouth daily.   Yes [provider]  lurasidone (LATUDA) 40 MG TABS tablet Take 40 mg by mouth daily with breakfast.   Yes [provider]  omeprazole (PRILOSEC) 20 MG capsule Take 20 mg by mouth daily.    Yes [provider]  oxybutynin (DITROPAN) 5 MG tablet Take 5 mg by mouth 3 (three) times daily.   Yes [provider]  ALPRAZolam (XANAX) 0.25 MG tablet Take 0.25 mg by mouth 3 (three) times daily. Patient not taking: Reported on 07/11/2020 05/21/18   [provider]  doxepin (SINEQUAN) 25 MG capsule Take 25 mg by mouth at bedtime. Patient not taking: Reported on 07/11/2020 07/18/18   [provider]  doxepin (SINEQUAN) 75 MG capsule Take 75 mg by mouth at bedtime. Patient not taking: Reported on 07/11/2020 06/09/20   [provider]  oxybutynin (DITROPAN-XL) 5 MG 24 hr tablet Take 5 mg by mouth daily. Patient not taking: Reported on 07/11/2020 07/04/20   [provider]  prazosin (MINIPRESS) 1 MG capsule Take 3 mg by mouth at bedtime. Patient not taking:  Reported on 07/11/2020 06/08/20   [provider]    Allergies: Allergies  Allergen Reactions  . Aspirin Nausea Only and Other (See Comments)    Makes stomach bleed and vomits blood  . Naproxen Nausea And Vomiting, Nausea Only and Other (See Comments)    Makes stomach bleed. Vomits blood  . Sumatriptan Succinate Other (See Comments)    Tingling over entire body. Painful tingling in jaw/mouth  . Lactose Intolerance (Gi) Other (See Comments)    Gi upset Can not take whole milk but ice cream and yogurt okay    Review of Systems: Review of Systems  Constitutional: Negative for chills and fever.  Respiratory: Negative for shortness of  breath.   Cardiovascular: Negative for chest pain.  Gastrointestinal: Negative for abdominal pain, nausea and vomiting.    Physical Exam BP 108/72   Pulse 84   Temp 98.3 F (36.8 C) (Oral)   Ht 5\' 4"  (1.626 m)   Wt 215 lb 3.2 oz (97.6 kg)   SpO2 97%   BMI 36.94 kg/m  CONSTITUTIONAL: No acute distress. HEENT:  Normocephalic, atraumatic, extraocular motion intact. RESPIRATORY:  Normal respiratory effort without pathologic use of accessory muscles. CARDIOVASCULAR:  Regular rhythm and rate. GI: The abdomen is soft, obese, non-distended, non-tender.  There is some discomfort with deep palpation of the umbilical hernia defect, which remains small, about 1 cm size.  The patient point towards the ASIS area and superior to it in the RLQ as the area of pain.  However, she was unable to feel the lipoma or pinpoint the exact location and I was also unable to palpate any masses on physical exam.  I brought the ultrasound into the room and explored the RLQ abdominal wall area in that location and was not able to see a discrete subcutaneous mass.  NEUROLOGIC:  Motor and sensation is grossly normal.  Cranial nerves are grossly intact. PSYCH:  Alert and oriented to person, place and time. Affect is normal.  Labs/Imaging: None recently.  Assessment and Plan: This is a 39 y.o. female with an umbilical hernia and RLQ abdominal wall pain.  --Discussed with the patient that unfortunately I am unable to palpate a mass this time around, and I am unable to see any discrete masses with the ultrasound at bedside.  Discussed with her that we can order a formal ultrasound to evaluate the RLQ abdominal wall, and if a mass is found, it can be marked by the sonographer so we can then have a target area to excise.  However, if the ultrasound does not find a mass, then there would be no target to excise and we would not proceed with any surgery. --The patient reports that she's asymptomatic from the umbilical hernia  standpoint and is not interested in any surgical repair of that at the moment.  Discussed with her the different types of hernias and symptoms associated with them, and recommended that she let us know if any worsening. --Once the ultrasound is done, we'll set up a procedure visit for excision, and I will call her if the U/S does not find anything.  Face-to-face time spent with the patient and care providers was 25 minutes, with more than 50% of the time spent counseling, educating, and coordinating care of the patient.     Melvyn Neth, [facility]

## 2020-07-11 NOTE — Patient Instructions (Addendum)
Dr.Piscoya discussed with patient having an repeat abdominal ultrasound to help detect lipoma(s). Patient is scheduled for Wednesday October 27 th, 2021 arrive at 8:30 am at [facility].  Patient instructions nothing to eat or drink after midnight. Patient will follow up in office for Procedure appointment November 3 rd, 2021 at 3:00p.  Lipoma  A lipoma is a noncancerous (benign) tumor that is made up of fat cells. This is a very common type of soft-tissue growth. Lipomas are usually found under the skin (subcutaneous). They may occur in any tissue of the body that contains fat. Common areas for lipomas to appear include the back, arms, shoulders, buttocks, and thighs. Lipomas grow slowly, and they are usually painless. Most lipomas do not cause problems and do not require treatment. What are the causes? The cause of this condition is not known. What increases the risk? You are more likely to develop this condition if:  You are 12-93 years old.  You have a family history of lipomas. What are the signs or symptoms? A lipoma usually appears as a small, round bump under the skin. In most cases, the lump will:  Feel soft or rubbery.  Not cause pain or other symptoms. However, if a lipoma is located in an area where it pushes on nerves, it can become painful or cause other symptoms. How is this diagnosed? A lipoma can usually be diagnosed with a physical exam. You may also have tests to confirm the diagnosis and to rule out other conditions. Tests may include:  Imaging tests, such as a CT scan or an MRI.  Removal of a tissue sample to be looked at under a microscope (biopsy). How is this treated? Treatment for this condition depends on the size of the lipoma and whether it is causing any symptoms.  For small lipomas that are not causing problems, no treatment is needed.  If a lipoma is bigger or it causes problems, surgery may be done to remove the lipoma. Lipomas can also be  removed to improve appearance. Most often, the procedure is done after applying a medicine that numbs the area (local anesthetic).  Liposuction may be done to reduce the size of the lipoma before it is removed through surgery, or it may be done to remove the lipoma. Lipomas are removed with this method in order to limit incision size and scarring. A liposuction tube is inserted through a small incision into the lipoma, and the contents of the lipoma are removed through the tube with suction. Follow these instructions at home:  Watch your lipoma for any changes.  Keep all follow-up visits as told by your health care provider. This is important. Contact a health care provider if:  Your lipoma becomes larger or hard.  Your lipoma becomes painful, red, or increasingly swollen. These could be signs of infection or a more serious condition. Get help right away if:  You develop tingling or numbness in an area near the lipoma. This could indicate that the lipoma is causing nerve damage. Summary  A lipoma is a noncancerous tumor that is made up of fat cells.  Most lipomas do not cause problems and do not require treatment.  If a lipoma is bigger or it causes problems, surgery may be done to remove the lipoma.  Contact a health care provider if your lipoma becomes larger or hard, or if it becomes painful, red, or increasingly swollen. Pain, redness, and swelling could be signs of infection or a more serious condition.  This information is not intended to replace advice given to you by your health care provider. Make sure you discuss any questions you have with your health care provider. Document Revised: 04/23/2019 Document Reviewed: 04/23/2019 Elsevier Patient Education  [place].

## 2020-07-16 ENCOUNTER — Ambulatory Visit: Payer: Medicaid Other | Attending: Surgery

## 2020-07-16 ENCOUNTER — Telehealth: Payer: Self-pay | Admitting: Surgery

## 2020-07-16 NOTE — Telephone Encounter (Signed)
Ultrasound called to inform us that the patient no showed her ultrasound appointment.

## 2020-07-21 NOTE — Telephone Encounter (Signed)
Ultrasound rescheduled for 07/30/20 at 8:45am at the outpatient imaging center. Arrival time 8:30am. NPO after midnight.   F/u Dr Hampton Abbot: 08/06/20 at 2pm  Pt called and made aware of appts. Pt voiced understanding.

## 2020-07-23 ENCOUNTER — Ambulatory Visit: Payer: Self-pay | Admitting: Surgery

## 2020-07-28 ENCOUNTER — Ambulatory Visit: Payer: Medicaid Other

## 2020-07-30 ENCOUNTER — Ambulatory Visit
Admission: RE | Admit: 2020-07-30 | Discharge: 2020-07-30 | Disposition: A | Discharge status: Home / Self Care | Payer: Medicaid Other | Source: Ambulatory Visit | Attending: Surgery | Admitting: Surgery

## 2020-07-30 ENCOUNTER — Other Ambulatory Visit: Payer: Self-pay

## 2020-07-30 DIAGNOSIS — D171 Benign lipomatous neoplasm of skin and subcutaneous tissue of trunk: Secondary | ICD-10-CM | POA: Diagnosis present

## 2020-08-04 NOTE — Progress Notes (Signed)
08/04/20  Discussed ultrasound findings with the patient.  Rather than lipomas, she has two very small abdominal wall hernia defects, in addition to a known umbilical hernia.  Have discussed with the patient about surgery to repair all hernia defects at once, but she's not able to do any surgery until after Christmas.  We'll change her appointment from 11/17 (procedure visit) to mid/late December for follow up visit.  Olean Ree, MD

## 2020-08-06 ENCOUNTER — Ambulatory Visit: Payer: Self-pay | Admitting: Surgery

## 2020-08-17 ENCOUNTER — Emergency Department
Admission: EM | Admit: 2020-08-17 | Discharge: 2020-08-17 | Disposition: A | Discharge status: Home / Self Care | Payer: Medicaid Other | Attending: Emergency Medicine | Admitting: Emergency Medicine

## 2020-08-17 ENCOUNTER — Encounter: Payer: Self-pay | Admitting: Radiology

## 2020-08-17 ENCOUNTER — Emergency Department: Payer: Medicaid Other

## 2020-08-17 ENCOUNTER — Other Ambulatory Visit: Payer: Self-pay

## 2020-08-17 DIAGNOSIS — R110 Nausea: Secondary | ICD-10-CM | POA: Diagnosis not present

## 2020-08-17 DIAGNOSIS — Z79899 Other long term (current) drug therapy: Secondary | ICD-10-CM | POA: Insufficient documentation

## 2020-08-17 DIAGNOSIS — Z87891 Personal history of nicotine dependence: Secondary | ICD-10-CM | POA: Diagnosis not present

## 2020-08-17 DIAGNOSIS — K219 Gastro-esophageal reflux disease without esophagitis: Secondary | ICD-10-CM | POA: Insufficient documentation

## 2020-08-17 DIAGNOSIS — R1033 Periumbilical pain: Secondary | ICD-10-CM | POA: Diagnosis not present

## 2020-08-17 DIAGNOSIS — R1084 Generalized abdominal pain: Secondary | ICD-10-CM | POA: Diagnosis present

## 2020-08-17 DIAGNOSIS — R1013 Epigastric pain: Secondary | ICD-10-CM | POA: Diagnosis not present

## 2020-08-17 DIAGNOSIS — I10 Essential (primary) hypertension: Secondary | ICD-10-CM | POA: Insufficient documentation

## 2020-08-17 LAB — CBC
HCT: 37.8 % (ref 36.0–46.0)
Hemoglobin: 11.5 g/dL — ABNORMAL LOW (ref 12.0–15.0)
MCH: 22.2 pg — ABNORMAL LOW (ref 26.0–34.0)
MCHC: 30.4 g/dL (ref 30.0–36.0)
MCV: 72.8 fL — ABNORMAL LOW (ref 80.0–100.0)
Platelets: 379 10*3/uL (ref 150–400)
RBC: 5.19 MIL/uL — ABNORMAL HIGH (ref 3.87–5.11)
RDW: 14.6 % (ref 11.5–15.5)
WBC: 13.1 10*3/uL — ABNORMAL HIGH (ref 4.0–10.5)
nRBC: 0 % (ref 0.0–0.2)

## 2020-08-17 LAB — URINALYSIS, COMPLETE (UACMP) WITH MICROSCOPIC
Bacteria, UA: NONE SEEN
Bilirubin Urine: NEGATIVE
Glucose, UA: NEGATIVE mg/dL
Ketones, ur: NEGATIVE mg/dL
Leukocytes,Ua: NEGATIVE
Nitrite: NEGATIVE
Protein, ur: 30 mg/dL — AB
Specific Gravity, Urine: 1.03 (ref 1.005–1.030)
pH: 5 (ref 5.0–8.0)

## 2020-08-17 LAB — COMPREHENSIVE METABOLIC PANEL
ALT: 15 U/L (ref 0–44)
AST: 14 U/L — ABNORMAL LOW (ref 15–41)
Albumin: 4.3 g/dL (ref 3.5–5.0)
Alkaline Phosphatase: 61 U/L (ref 38–126)
Anion gap: 10 (ref 5–15)
BUN: 12 mg/dL (ref 6–20)
CO2: 23 mmol/L (ref 22–32)
Calcium: 8.9 mg/dL (ref 8.9–10.3)
Chloride: 105 mmol/L (ref 98–111)
Creatinine, Ser: 0.91 mg/dL (ref 0.44–1.00)
GFR, Estimated: >60 mL/min (ref >60)
Glucose, Bld: 142 mg/dL — ABNORMAL HIGH (ref 70–99)
Potassium: 3.6 mmol/L (ref 3.5–5.1)
Sodium: 138 mmol/L (ref 135–145)
Total Bilirubin: 0.4 mg/dL (ref 0.3–1.2)
Total Protein: 7.6 g/dL (ref 6.5–8.1)

## 2020-08-17 LAB — LIPASE, BLOOD: Lipase: 23 U/L (ref 11–51)

## 2020-08-17 LAB — POC URINE PREG, ED: Preg Test, Ur: NEGATIVE

## 2020-08-17 MED ORDER — MORPHINE SULFATE (PF) 4 MG/ML IV SOLN
4.0000 mg | Freq: Once | INTRAVENOUS | Status: AC
  Administered 2020-08-17: 4 mg via INTRAVENOUS
  Filled 2020-08-17: qty 1

## 2020-08-17 MED ORDER — IOHEXOL 9 MG/ML PO SOLN
500.0000 mL | Freq: Two times a day (BID) | ORAL | Status: DC | PRN
  Administered 2020-08-17 (×2): 500 mL via ORAL

## 2020-08-17 MED ORDER — IOHEXOL 300 MG/ML  SOLN
100.0000 mL | Freq: Once | INTRAMUSCULAR | Status: AC | PRN
  Administered 2020-08-17: 100 mL via INTRAVENOUS

## 2020-08-17 MED ORDER — ONDANSETRON HCL 4 MG/2ML IJ SOLN
4.0000 mg | Freq: Once | INTRAMUSCULAR | Status: AC
  Administered 2020-08-17: 4 mg via INTRAVENOUS
  Filled 2020-08-17: qty 2

## 2020-08-17 NOTE — ED Notes (Signed)
First Nurse Note: Pt to ED via POV c/o abd pain

## 2020-08-17 NOTE — ED Triage Notes (Signed)
Pt states she has 3 abdominal hernias. Pt states today she went to pick something up and she started to have severe abdominal pain. Pt states she has an appointment in December to set up surgery, but the pain has gotten worse.

## 2020-08-17 NOTE — ED Provider Notes (Signed)
[facility] Emergency Department Provider Note ____________________________________________   First MD Initiated Contact with Patient 08/17/20 2005     (approximate)  I have reviewed the triage vital signs and the nursing notes.  HISTORY  Chief Complaint Abdominal Pain   HPI Dominique Dyer is a 39 y.o. femalewho presents to the ED for evaluation of abdominal pain.  Chart review indicates known umbilical hernia, last seeing general surgery Dr. Hampton Abbot 1 month ago in the clinic. Follow-up ultrasound to evaluate for possible abdominal wall lipoma, performed on 11/10, finds evidence of 2 adjacent fat-containing right-sided para umbilical hernias.  Patient reports being in her typical state of health without any abdominal pain until this afternoon, about 4 hours prior to arrival, when she was scooping of her daughter and felt sudden onset periumbilical and epigastric abdominal pain at that time.  She reports the pain has been present since then in a constant fashion, up to 8/10 intensity, sharp and burning in nature, and intermittently radiating to her thoracic back.  She reports associated nausea.  Denies associated emesis, diarrhea, vaginal bleeding or discharge, dysuria, syncope, chest pain, shortness of breath, falls or trauma.   Past Medical History:  Diagnosis Date  . Anemia 2019   iron deficiency and b12 deficiency  . Anginal pain ([place]) 2019   with pregnancy. ekg fine. cardiology reviewed  . Anxiety   . Bipolar disorder ([place])   . Dyspnea    with pregnancy  . Dysrhythmia 2019   tachycardia with pregnancy and panic attacks  . GERD (gastroesophageal reflux disease)   . Hypertension 2019   with pregnancy  . Migraines    has not had as many recently.  at most, once a month  . Panic attacks   . Pica 2019   ice    Patient Active Problem List   Diagnosis Date Noted  . Abnormal Pap smear of cervix 05/19/2020  . Anxiety 05/19/2020  . Bipolar 1  disorder ([place]) 05/19/2020  . GERD (gastroesophageal reflux disease) 05/19/2020  . Hypertension 05/19/2020  . Panic attacks 05/19/2020  . Insomnia 05/05/2020  . Bilateral occipital neuralgia 04/19/2019  . Delivery by cesarean section using transverse incision of lower segment of uterus 04/14/2018  . Carpal tunnel syndrome during pregnancy 03/28/2018  . Sinus tachycardia 03/08/2018  . Elevated blood pressure affecting pregnancy in third trimester, antepartum 03/08/2018  . Atypical chest pain 03/08/2018  . Shortness of breath 03/08/2018  . Obesity in pregnancy 03/01/2018  . Leucocytosis 02/08/2018  . B12 deficiency 02/01/2018  . Low vitamin B12 level 12/28/2017  . Goals of care, counseling/discussion 12/28/2017  . Iron deficiency anemia 11/16/2017  . History of anemia 11/09/2017  . Anemia of pregnancy in third trimester 11/09/2017  . H/O cesarean section complicating pregnancy 89/37/3428  . Marijuana abuse 11/09/2017  . History of pregnancy induced hypertension 11/09/2017  . Advanced maternal age in multigravida 11/09/2017  . Atypical squamous cell changes of undetermined significance (ASCUS) on cervical cytology with positive high risk human papilloma virus (HPV) 11/09/2017  . Intractable chronic migraine without aura and without status migrainosus 01/21/2017  . Recurrent headache 02/01/2016  . Low grade squamous intraepithelial lesion on cytologic smear of cervix (LGSIL) 05/21/2015  . Marijuana use 03/13/2015  . Pregnancy 02/24/2015  . History of cervical dysplasia 06/21/2012    Past Surgical History:  Procedure Laterality Date  . CESAREAN SECTION    . CESAREAN SECTION Bilateral 04/14/2018   Procedure: REPEAT CESAREAN SECTION WITH BTL;  Surgeon: Amalia Hailey,  Nyoka Lint, MD;  Location: [facility] ORS;  Service: Obstetrics;  Laterality: Bilateral;  Birth: 10:45 Sex: Female Weight: 8 lbs 3 oz   . COLPOSCOPY  2006  . TUBAL LIGATION  2019    Prior to Admission medications   Medication Sig  Start Date End Date Taking? Authorizing Provider  ALPRAZolam (XANAX) 0.25 MG tablet Take 0.25 mg by mouth 3 (three) times daily. Patient not taking: Reported on 07/11/2020 05/21/18   [provider]  ALPRAZolam Duanne Moron) 0.5 MG tablet Take 0.5 mg by mouth 3 (three) times daily as needed. 07/06/20   [provider]  diphenhydrAMINE-PE-APAP 25-5-325 MG TABS Take 2 tablets by mouth daily as needed (for congestion).    [provider]  doxepin (SINEQUAN) 25 MG capsule Take 25 mg by mouth at bedtime. Patient not taking: Reported on 07/11/2020 07/18/18   [provider]  doxepin (SINEQUAN) 75 MG capsule Take 75 mg by mouth at bedtime. Patient not taking: Reported on 07/11/2020 06/09/20   [provider]  gabapentin (NEURONTIN) 100 MG capsule  07/02/20   [provider]  lamoTRIgine (LAMICTAL) 200 MG tablet Take 400 mg by mouth daily.    [provider]  lurasidone (LATUDA) 40 MG TABS tablet Take 40 mg by mouth daily with breakfast.    [provider]  omeprazole (PRILOSEC) 20 MG capsule Take 20 mg by mouth daily.     [provider]  oxybutynin (DITROPAN) 5 MG tablet Take 5 mg by mouth 3 (three) times daily.    [provider]  oxybutynin (DITROPAN-XL) 5 MG 24 hr tablet Take 5 mg by mouth daily. Patient not taking: Reported on 07/11/2020 07/04/20   [provider]  prazosin (MINIPRESS) 1 MG capsule Take 3 mg by mouth at bedtime. Patient not taking: Reported on 07/11/2020 06/08/20   [provider]    Allergies Aspirin, Naproxen, Sumatriptan succinate, and Lactose intolerance (gi)  Family History  Problem Relation Age of Onset  . Cancer Paternal Uncle   . Cancer Maternal Grandfather   . Heart attack Mother 54  . Seizures Father   . Heart attack Paternal Grandfather     Social History Social History   Tobacco Use  . Smoking status: Former Smoker    Types: Cigarettes  . Smokeless  tobacco: Never Used  Vaping Use  . Vaping Use: Never used  Substance Use Topics  . Alcohol use: No  . Drug use: Not Currently    Types: Marijuana    Comment: last smoked 1 week ago    Review of Systems  Constitutional: No fever/chills Eyes: No visual changes. ENT: No sore throat. Cardiovascular: Denies chest pain. Respiratory: Denies shortness of breath. Gastrointestinal: Positive for abdominal pain and nausea. , no vomiting.  No diarrhea.  No constipation. Genitourinary: Negative for dysuria. Musculoskeletal: Negative for back pain. Skin: Negative for rash. Neurological: Negative for headaches, focal weakness or numbness.  ____________________________________________   PHYSICAL EXAM:  VITAL SIGNS: Vitals:   08/17/20 1911  BP: 125/73  Pulse: 73  Resp: 17  Temp: 98.6 F (37 C)  SpO2: 99%     Constitutional: Alert and oriented. Well appearing and in no acute distress.  Obese.  Conversational in full sentences. Eyes: Conjunctivae are normal. PERRL. EOMI. Head: Atraumatic. Nose: No congestion/rhinnorhea. Mouth/Throat: Mucous membranes are moist.  Oropharynx non-erythematous. Neck: No stridor. No cervical spine tenderness to palpation. Cardiovascular: Normal rate, regular rhythm. Grossly normal heart sounds.  Good peripheral circulation. Respiratory: Normal respiratory effort.  No retractions. Lungs CTAB. Gastrointestinal: Soft , nondistended. No CVA tenderness. Epigastric LLQ, RLQ and RUQ tenderness to palpation with intermittent voluntary guarding.  No peritoneal features.  No CVA tenderness bilaterally. Musculoskeletal: No lower extremity tenderness nor edema.  No joint effusions. No signs of acute trauma. Neurologic:  Normal speech and language. No gross focal neurologic deficits are appreciated. No gait instability noted. Skin:  Skin is warm, dry and intact. No rash noted. Psychiatric: Mood and affect are normal. Speech and behavior are  normal.  ____________________________________________   LABS (all labs ordered are listed, but only abnormal results are displayed)  Labs Reviewed  COMPREHENSIVE METABOLIC PANEL - Abnormal; Notable for the following components:      Result Value   Glucose, Bld 142 (*)    AST 14 (*)    All other components within normal limits  CBC - Abnormal; Notable for the following components:   WBC 13.1 (*)    RBC 5.19 (*)    Hemoglobin 11.5 (*)    MCV 72.8 (*)    MCH 22.2 (*)    All other components within normal limits  URINALYSIS, COMPLETE (UACMP) WITH MICROSCOPIC - Abnormal; Notable for the following components:   Color, Urine YELLOW (*)    APPearance HAZY (*)    Hgb urine dipstick SMALL (*)    Protein, ur 30 (*)    All other components within normal limits  LIPASE, BLOOD  POC URINE PREG, ED   ____________________________________________  12 Lead EKG   ____________________________________________  RADIOLOGY  ED MD interpretation: CT abdomen/pelvis reviewed by me without evidence of acute intracranial pathology.  Official radiology report(s): CT ABDOMEN PELVIS W CONTRAST  Result Date: 08/17/2020 CLINICAL DATA:  Abdominal pain. EXAM: CT ABDOMEN AND PELVIS WITH CONTRAST TECHNIQUE: Multidetector CT imaging of the abdomen and pelvis was performed using the standard protocol following bolus administration of intravenous contrast. CONTRAST:  138mL OMNIPAQUE IOHEXOL 300 MG/ML  SOLN COMPARISON:  April 11, 2020 FINDINGS: Lower chest: No acute abnormality. Hepatobiliary: No focal liver abnormality is seen. No gallstones, gallbladder wall thickening, or biliary dilatation. Pancreas: Unremarkable. No pancreatic ductal dilatation or surrounding inflammatory changes. Spleen: Normal in size without focal abnormality. Adrenals/Urinary Tract: Adrenal glands are unremarkable. Kidneys are normal, without renal calculi, focal lesion, or hydronephrosis. Bladder is unremarkable. Stomach/Bowel: Stomach is  within normal limits. Appendix appears normal. No evidence of bowel wall thickening, distention, or inflammatory changes. Vascular/Lymphatic: No significant vascular findings are present. No enlarged abdominal or pelvic lymph nodes. Reproductive: The uterus is normal in appearance. Tubal ligation clips are seen within the pelvis on the left. Other: There is a stable 1.8 cm x 1.0 cm fat containing umbilical hernia. No abdominopelvic ascites. Musculoskeletal: No acute or significant osseous findings. IMPRESSION: 1. No CT evidence of acute or active process within the abdomen or pelvis. 2. Stable fat containing umbilical hernia. Electronically Signed   By: Virgina Norfolk M.D.   On: 08/17/2020 21:58    ____________________________________________   PROCEDURES and INTERVENTIONS  Procedure(s) performed (including Critical Care):  Procedures  Medications  iohexol (OMNIPAQUE) 9 MG/ML oral solution 500 mL (500 mLs Oral Contrast Given 08/17/20 2041)  morphine 4 MG/ML injection 4 mg (4 mg Intravenous Given 08/17/20 2035)  ondansetron (ZOFRAN) injection 4 mg (4 mg Intravenous Given 08/17/20 2035)  iohexol (OMNIPAQUE) 300 MG/ML solution 100 mL (100 mLs Intravenous Contrast Given 08/17/20 2134)    ____________________________________________   MDM / ED COURSE   39 year old female with known fat-containing ventral hernias presents  to the ED with acute pain, without evidence of acute pathology, and amenable to outpatient management with surgical follow-up.  Normal vitals on room air.  Exam with mild and poorly localizing tenderness without peritoneal features.  She otherwise looks well without evidence of acute derangements, distress, neurovascular deficits or trauma.  Blood work with marginal elevation of her WBC, otherwise unremarkable.  CT performed and does not show evidence of incarcerated hernia or additional acute intra-abdominal pathology.  Revisualization of her known fat-containing hernias.   Patient is tolerating p.o. intake and I see no evidence of acute pathology to warrant additional work-up in the ED or hospitalization.  I urged her to follow-up with Dr. Hampton Abbot as an outpatient and we discussed return precautions for the ED.  Patient medically stable for discharge home.   Clinical Course as of Aug 17 2214  Nancy Fetter Aug 17, 2020  2110 Reassessed.  Patient was improving symptoms.  Tolerating p.o. contrast.  Awaiting CT.   [DS]  2209 Reassessed.  Patient reports improving pain.  We discussed reassuring CT imaging.  We discussed no evidence of acute pathology to warrant additional work-up or management in the ED.  She is agreeable to discharge.  We discussed return precautions for the ED.   [DS]    Clinical Course User Index [DS] Vladimir Crofts, MD    ____________________________________________   FINAL CLINICAL IMPRESSION(S) / ED DIAGNOSES  Final diagnoses:  Generalized abdominal pain  Nausea     ED Discharge Orders    None       Simeon Vera Tamala Julian   Note:  This document was prepared using Dragon voice recognition software and may include unintentional dictation errors.   Vladimir Crofts, MD 08/17/20 2216

## 2020-08-17 NOTE — Discharge Instructions (Signed)
As we discussed, your CT showed the known hernia to the right side of your bellybutton, but nothing acutely concerning.  Please continue your normal medications at home and follow-up with Dr. Hampton Abbot in his clinic next month.  Return to the ED with any acutely worsening symptoms.

## 2020-08-21 NOTE — Progress Notes (Incomplete)
[facility]  [address] [place], [place] [address] Phone: [phone]  Fax: [phone]   Clinic Day:  08/21/2020  Referring physician: Valera Castle, *  Chief Complaint: Dominique Dyer is a 39 y.o. female with iron deficiency anemia and B12 deficiency who is seen for 6 month assessment.  HPI: The patient was last seen in the hematology clinic for a telemedicine visit on 03/03/2020. At that time, she felt tired at work sometimes.  She noted some GI upset from what she ate the night before. CBC on 02/25/2020 revealed a hematocrit was 36.7, hemoglobin 11.2, MCV 71.1, platelets 353,000, WBC 11,000 ([place] 7200).  Ferritin was 76 with an iron saturation of 11% and a TIBC 337.  She received vitamin B12 injections on 03/10/2020, 04/07/2020, and 06/02/2020.  She was seen in the  [facility] ER on 04/11/2020 for flank pain, urinary frequency, and mild hematuria. CT ruled out appendicitis and kidney stone, though it was felt that she may have a kidney stone that did not show up on CT. Follow up with PCP and OBGYN was recommended. She was discharged with prescriptions for Keflex, Flomax, and Vicodin.  She was seen in the [facility] ER on 08/17/2020 for acute abdominal pain.  Abdomen and pelvis CT revealed no evidence of incarcerated hernia or acute intra-abdominal pathology.  During the interim, ***   Past Medical History:  Diagnosis Date  . Anemia 2019   iron deficiency and b12 deficiency  . Anginal pain ([place]) 2019   with pregnancy. ekg fine. cardiology reviewed  . Anxiety   . Bipolar disorder ([place])   . Dyspnea    with pregnancy  . Dysrhythmia 2019   tachycardia with pregnancy and panic attacks  . GERD (gastroesophageal reflux disease)   . Hypertension 2019   with pregnancy  . Migraines    has not had as many recently.  at most, once a month  . Panic attacks   . Pica 2019   ice    Past Surgical History:  Procedure Laterality Date  . CESAREAN  SECTION    . CESAREAN SECTION Bilateral 04/14/2018   Procedure: REPEAT CESAREAN SECTION WITH BTL;  Surgeon: [name], MD;  Location: [facility] ORS;  Service: Obstetrics;  Laterality: Bilateral;  Birth: 10:[birth year] Sex: Female Weight: 8 lbs 3 oz   . COLPOSCOPY  2006  . TUBAL LIGATION  2019    Family History  Problem Relation Age of Onset  . Cancer Paternal Uncle   . Cancer Maternal Grandfather   . Heart attack Mother 37  . Seizures Father   . Heart attack Paternal Grandfather     Social History:  reports that she has quit smoking. Her smoking use included cigarettes. She has never used smokeless tobacco. She reports previous drug use. Drug: Marijuana. She reports that she does not drink alcohol. Patient is originally from [place], [place]. She lives in [place]. Patient is unemployed. Patient denies known exposures to radiation on toxins. The patient is alone*** today.  Allergies:  Allergies  Allergen Reactions  . Aspirin Nausea Only and Other (See Comments)    Makes stomach bleed and vomits blood  . Naproxen Nausea And Vomiting, Nausea Only and Other (See Comments)    Makes stomach bleed. Vomits blood  . Sumatriptan Succinate Other (See Comments)    Tingling over entire body. Painful tingling in jaw/mouth  . Lactose Intolerance (Gi) Other (See Comments)    Gi upset Can not take whole milk but  ice cream and yogurt okay    Current Medications: Current Outpatient Medications  Medication Sig Dispense Refill  . ALPRAZolam (XANAX) 0.25 MG tablet Take 0.25 mg by mouth 3 (three) times daily. (Patient not taking: Reported on 07/11/2020)    . ALPRAZolam (XANAX) 0.5 MG tablet Take 0.5 mg by mouth 3 (three) times daily as needed.    . diphenhydrAMINE-PE-APAP 25-5-325 MG TABS Take 2 tablets by mouth daily as needed (for congestion).    Marland Kitchen doxepin (SINEQUAN) 25 MG capsule Take 25 mg by mouth at bedtime. (Patient not taking: Reported on 07/11/2020)  1  . doxepin (SINEQUAN) 75 MG capsule  Take 75 mg by mouth at bedtime. (Patient not taking: Reported on 07/11/2020)    . gabapentin (NEURONTIN) 100 MG capsule     . lamoTRIgine (LAMICTAL) 200 MG tablet Take 400 mg by mouth daily.    Marland Kitchen lurasidone (LATUDA) 40 MG TABS tablet Take 40 mg by mouth daily with breakfast.    . omeprazole (PRILOSEC) 20 MG capsule Take 20 mg by mouth daily.     Marland Kitchen oxybutynin (DITROPAN) 5 MG tablet Take 5 mg by mouth 3 (three) times daily.    Marland Kitchen oxybutynin (DITROPAN-XL) 5 MG 24 hr tablet Take 5 mg by mouth daily. (Patient not taking: Reported on 07/11/2020)    . prazosin (MINIPRESS) 1 MG capsule Take 3 mg by mouth at bedtime. (Patient not taking: Reported on 07/11/2020)     No current facility-administered medications for this visit.    Review of Systems  Constitutional: Positive for malaise/fatigue. Negative for chills, diaphoresis, fever and weight loss (stable).       Overall, feels fine.  Today, feeling sick from what she ate the night before.  HENT: Negative.  Negative for congestion, ear pain, nosebleeds, sinus pain and sore throat.   Eyes: Negative.  Negative for blurred vision, double vision and pain.  Respiratory: Negative.  Negative for cough, hemoptysis, sputum production and shortness of breath.   Cardiovascular: Negative.  Negative for chest pain, palpitations, orthopnea, leg swelling and PND.  Gastrointestinal: Negative for abdominal pain, blood in stool, constipation, diarrhea, heartburn, melena, nausea and vomiting.       Upset stomach, food related.  Genitourinary: Negative.  Negative for dysuria, frequency, hematuria and urgency.  Musculoskeletal: Negative.  Negative for back pain, falls, joint pain and myalgias.       Carpal tunnel.  Skin: Negative.  Negative for itching and rash.  Neurological: Positive for sensory change (peripheral neruopathy). Negative for dizziness, tremors, speech change, focal weakness, weakness and headaches.       Carpal tunnel.  Endo/Heme/Allergies: Negative.   Does not bruise/bleed easily.  Psychiatric/Behavioral: Negative for depression, memory loss and suicidal ideas. The patient is not nervous/anxious and does not have insomnia.   All other systems reviewed and are negative.  Performance status (ECOG): 1***  Physical Exam Nursing note reviewed.  Constitutional:      General: She is not in acute distress.    Appearance: She is well-developed. She is not diaphoretic.  HENT:     Head: Normocephalic and atraumatic.  Eyes:     General: No scleral icterus.    Extraocular Movements: Extraocular movements intact.     Pupils: Pupils are equal, round, and reactive to light.  Neurological:     Mental Status: She is alert and oriented to person, place, and time. Mental status is at baseline.  Psychiatric:        Mood and Affect: Mood normal.  Behavior: Behavior normal.        Thought Content: Thought content normal.        Judgment: Judgment normal.    No visits with results within 3 Day(s) from this visit.  Latest known visit with results is:  Admission on 08/17/2020, Discharged on 08/17/2020  Component Date Value Ref Range Status  . Lipase 08/17/2020 23  11 - 51 U/L Final   Performed at [facility], [address] [place], [place] [address]  . Sodium 08/17/2020 138  135 - 145 mmol/L Final  . Potassium 08/17/2020 3.6  3.5 - 5.1 mmol/L Final  . Chloride 08/17/2020 105  98 - 111 mmol/L Final  . CO2 08/17/2020 23  22 - 32 mmol/L Final  . Glucose, Bld 08/17/2020 142* 70 - 99 mg/dL Final   Glucose reference range applies only to samples taken after fasting for at least 8 hours.  . BUN 08/17/2020 12  6 - 20 mg/dL Final  . Creatinine, Ser 08/17/2020 0.91  0.44 - 1.00 mg/dL Final  . Calcium 08/17/2020 8.9  8.9 - 10.3 mg/dL Final  . Total Protein 08/17/2020 7.6  6.5 - 8.1 g/dL Final  . Albumin 08/17/2020 4.3  3.5 - 5.0 g/dL Final  . AST 08/17/2020 14* 15 - 41 U/L Final  . ALT 08/17/2020 15  0 - 44 U/L Final  . Alkaline  Phosphatase 08/17/2020 61  38 - 126 U/L Final  . Total Bilirubin 08/17/2020 0.4  0.3 - 1.2 mg/dL Final  . GFR, Estimated 08/17/2020 >60  >60 mL/min Final   Comment: (NOTE) Calculated using the CKD-EPI Creatinine Equation (2021)   . Anion gap 08/17/2020 10  5 - 15 Final   Performed at [facility], [address] [place], [place] [address]  . WBC 08/17/2020 13.1* 4.0 - 10.5 K/uL Final  . RBC 08/17/2020 5.19* 3.87 - 5.11 MIL/uL Final  . Hemoglobin 08/17/2020 11.5* 12.0 - 15.0 g/dL Final  . HCT 08/17/2020 37.8  36 - 46 % Final  . MCV 08/17/2020 72.8* 80.0 - 100.0 fL Final  . MCH 08/17/2020 22.2* 26.0 - 34.0 pg Final  . MCHC 08/17/2020 30.4  30.0 - 36.0 g/dL Final  . RDW 08/17/2020 14.6  11.5 - 15.5 % Final  . Platelets 08/17/2020 379  150 - 400 K/uL Final  . nRBC 08/17/2020 0.0  0.0 - 0.2 % Final   Performed at [facility], [address], [place], [place] [address]  . Color, Urine 08/17/2020 YELLOW* YELLOW Final  . APPearance 08/17/2020 HAZY* CLEAR Final  . Specific Gravity, Urine 08/17/2020 1.030  1.005 - 1.030 Final  . pH 08/17/2020 5.0  5.0 - 8.0 Final  . Glucose, UA 08/17/2020 NEGATIVE  NEGATIVE mg/dL Final  . Hgb urine dipstick 08/17/2020 SMALL* NEGATIVE Final  . Bilirubin Urine 08/17/2020 NEGATIVE  NEGATIVE Final  . Ketones, ur 08/17/2020 NEGATIVE  NEGATIVE mg/dL Final  . Protein, ur 08/17/2020 30* NEGATIVE mg/dL Final  . Nitrite 08/17/2020 NEGATIVE  NEGATIVE Final  . Chalmers Guest 08/17/2020 NEGATIVE  NEGATIVE Final  . RBC / HPF 08/17/2020 6-10  0 - 5 RBC/hpf Final  . WBC, UA 08/17/2020 0-5  0 - 5 WBC/hpf Final  . Bacteria, UA 08/17/2020 NONE SEEN  NONE SEEN Final  . Squamous Epithelial / LPF 08/17/2020 6-10  0 - 5 Final  . Mucus 08/17/2020 PRESENT   Final   Performed at [facility], [address] [place], [place] [address]  . Preg Test,  Ur 08/17/2020 Negative  Negative Final    Assessment:  Dominique Dyer is a 39 y.o. female withiron  deficiency anemia who returns to clinic for repeat evaluation. Dietappears good. She has ice pica. She denies any bleeding. She was on oral iron.  CBC on 01/23/2019revealed a hematocrit of 32.4, hemoglobin 9.9, and MCV 67. Ferritin was 13 with an iron saturation of 15% and a TIBC of 440 on 11/09/2017.  Labs on 04/03/2019revealed a hematocrit of 32.0, hemoglobin 10.2, and MCV 68.1. Ferritin was 6. Iron saturation 3% with a TIBC of 517. Reticulocytes 1%. B12level was low at 195. Folate normal at 13.9. TSH was 0.893 (normal) and free T4 0.56 (0.61-1.12) on 12/28/2017. BCR-ABL was negative on 01/25/2018.  Hemoglobin electrophoresison 02/22/2018 revealed 3.5% Hgb A2 (1.8-3.2%), and Hgb A 96.5%. 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  She underwent planned cesarean sectionwith bilateral tubal ligation on 04/14/2018.  She received Venoferweekly x 4 (02/08/2018 - 03/01/2018), x 3 (02/07/2019 - 02/28/2019), and x 2 (01/07/2020 and 02/04/2020).  Ferritinhas been followed: 13 on 11/09/2017, 6 on 12/21/2017, 12 on 01/24/2018, 11 on 02/07/2018, 197 on 03/06/2018, 26 on 05/03/2018, 33 on 08/01/2018, 32 on 11/08/2018, 26 on 02/01/2019, 55 on 06/06/2019, 53 on 09/10/2019, and 55 on 06/06/2019.   She has B12 deficiency. B12 was 195 on 12/21/2017 and 177 on 01/24/2018. She was on oral B12. She began B12 injectionson 01/25/2018 (last 02/04/2020). Folate was 6.3 on 02/22/2018 and 9.3 on 05/03/2018.  Symptomatically, ***  Plan: 1.   Labs today:  CBC with diff, ferritin, iron studies.   2.   Iron deficiency anemia Hematocrit 36.7.  Hemoglobin 11.2.  MCV 71.1 106/03/2020. Ferritin 76 with an iron saturation of 11% and a TIBC of 337. Ferritin goal remains at 100.  Review plan for IV iron if ferritin < 30 or symptomatic.  History of BTL with plan for urine pregnancy test prior to any iron transfusions.  No Venofer needed.  3.   B12 deficiency Please  schedule B12 and monthly x 6 (last 02/04/2020). Check folate annually. 4.   RTC in 3 months (on day B12) for labs (CBC, ferritin, folate) and B12. 5.   RTC in 6 months (on day B12) for MD assessment, labs (CBC with diff, ferritin, iron studies-day before), B12 and +/- Venofer.  I discussed the assessment and treatment plan with the patient.  The patient was provided an opportunity to ask questions and all were answered.  The patient agreed with the plan and demonstrated an understanding of the instructions.  The patient was advised to call back if the symptoms worsen or if the condition fails to improve as anticipated.  I provided *** minutes of face-to-face time during this this encounter and > 50% was spent counseling as documented under my assessment and plan.  Lequita Asal, MD, PhD    08/21/2020, 11:34 AM  I, Evert Kohl, am acting as a Education administrator for Lequita Asal, MD.  I, [place] Mike Gip, MD, have reviewed the above documentation for accuracy and completeness, and I agree with the above.

## 2020-08-25 ENCOUNTER — Inpatient Hospital Stay: Payer: Medicaid Other

## 2020-08-25 ENCOUNTER — Inpatient Hospital Stay: Payer: Medicaid Other | Attending: Hematology and Oncology | Admitting: Hematology and Oncology

## 2020-09-15 ENCOUNTER — Ambulatory Visit: Payer: Medicaid Other | Admitting: Surgery

## 2021-06-16 DIAGNOSIS — F17200 Nicotine dependence, unspecified, uncomplicated: Secondary | ICD-10-CM | POA: Insufficient documentation

## 2021-06-17 ENCOUNTER — Encounter: Payer: Self-pay | Admitting: General Surgery

## 2021-07-21 ENCOUNTER — Emergency Department: Payer: Medicaid Other

## 2021-07-21 ENCOUNTER — Other Ambulatory Visit: Payer: Self-pay

## 2021-07-21 ENCOUNTER — Emergency Department
Admission: EM | Admit: 2021-07-21 | Discharge: 2021-07-21 | Disposition: A | Discharge status: Home / Self Care | Payer: Medicaid Other | Attending: Emergency Medicine | Admitting: Emergency Medicine

## 2021-07-21 ENCOUNTER — Encounter: Payer: Self-pay | Admitting: Emergency Medicine

## 2021-07-21 DIAGNOSIS — R109 Unspecified abdominal pain: Secondary | ICD-10-CM | POA: Insufficient documentation

## 2021-07-21 DIAGNOSIS — Z87891 Personal history of nicotine dependence: Secondary | ICD-10-CM | POA: Diagnosis not present

## 2021-07-21 DIAGNOSIS — K219 Gastro-esophageal reflux disease without esophagitis: Secondary | ICD-10-CM | POA: Diagnosis not present

## 2021-07-21 DIAGNOSIS — Z79899 Other long term (current) drug therapy: Secondary | ICD-10-CM | POA: Diagnosis not present

## 2021-07-21 DIAGNOSIS — S0993XA Unspecified injury of face, initial encounter: Secondary | ICD-10-CM | POA: Diagnosis present

## 2021-07-21 DIAGNOSIS — J019 Acute sinusitis, unspecified: Secondary | ICD-10-CM | POA: Diagnosis not present

## 2021-07-21 DIAGNOSIS — S0083XA Contusion of other part of head, initial encounter: Secondary | ICD-10-CM | POA: Insufficient documentation

## 2021-07-21 DIAGNOSIS — I10 Essential (primary) hypertension: Secondary | ICD-10-CM | POA: Diagnosis not present

## 2021-07-21 DIAGNOSIS — S025XXA Fracture of tooth (traumatic), initial encounter for closed fracture: Secondary | ICD-10-CM | POA: Diagnosis not present

## 2021-07-21 LAB — COMPREHENSIVE METABOLIC PANEL
ALT: 15 U/L (ref 0–44)
AST: 15 U/L (ref 15–41)
Albumin: 4.1 g/dL (ref 3.5–5.0)
Alkaline Phosphatase: 52 U/L (ref 38–126)
Anion gap: 8 (ref 5–15)
BUN: 14 mg/dL (ref 6–20)
CO2: 23 mmol/L (ref 22–32)
Calcium: 9.2 mg/dL (ref 8.9–10.3)
Chloride: 108 mmol/L (ref 98–111)
Creatinine, Ser: 0.79 mg/dL (ref 0.44–1.00)
GFR, Estimated: >60 mL/min (ref >60)
Glucose, Bld: 89 mg/dL (ref 70–99)
Potassium: 3.8 mmol/L (ref 3.5–5.1)
Sodium: 139 mmol/L (ref 135–145)
Total Bilirubin: 0.7 mg/dL (ref 0.3–1.2)
Total Protein: 7.4 g/dL (ref 6.5–8.1)

## 2021-07-21 LAB — CBC WITH DIFFERENTIAL/PLATELET
Abs Immature Granulocytes: 0.03 10*3/uL (ref 0.00–0.07)
Basophils Absolute: 0.1 10*3/uL (ref 0.0–0.1)
Basophils Relative: 1 %
Eosinophils Absolute: 0.1 10*3/uL (ref 0.0–0.5)
Eosinophils Relative: 1 %
HCT: 35.4 % — ABNORMAL LOW (ref 36.0–46.0)
Hemoglobin: 11 g/dL — ABNORMAL LOW (ref 12.0–15.0)
Immature Granulocytes: 0 %
Lymphocytes Relative: 37 %
Lymphs Abs: 3.5 10*3/uL (ref 0.7–4.0)
MCH: 22.2 pg — ABNORMAL LOW (ref 26.0–34.0)
MCHC: 31.1 g/dL (ref 30.0–36.0)
MCV: 71.4 fL — ABNORMAL LOW (ref 80.0–100.0)
Monocytes Absolute: 0.5 10*3/uL (ref 0.1–1.0)
Monocytes Relative: 5 %
Neutro Abs: 5.3 10*3/uL (ref 1.7–7.7)
Neutrophils Relative %: 56 %
Platelets: 328 10*3/uL (ref 150–400)
RBC: 4.96 MIL/uL (ref 3.87–5.11)
RDW: 14.1 % (ref 11.5–15.5)
WBC: 9.4 10*3/uL (ref 4.0–10.5)
nRBC: 0 % (ref 0.0–0.2)

## 2021-07-21 LAB — LIPASE, BLOOD: Lipase: 37 U/L (ref 11–51)

## 2021-07-21 LAB — PREGNANCY, URINE: Preg Test, Ur: NEGATIVE

## 2021-07-21 MED ORDER — AMOXICILLIN-POT CLAVULANATE 875-125 MG PO TABS: 1.0000 | ORAL_TABLET | Freq: Two times a day (BID) | ORAL | 0 refills | Status: AC

## 2021-07-21 MED ORDER — DIPHENHYDRAMINE HCL 50 MG/ML IJ SOLN
12.5000 mg | Freq: Once | INTRAMUSCULAR | Status: AC
  Administered 2021-07-21: 12.5 mg via INTRAVENOUS
  Filled 2021-07-21: qty 1

## 2021-07-21 MED ORDER — METOCLOPRAMIDE HCL 5 MG/ML IJ SOLN
10.0000 mg | Freq: Once | INTRAMUSCULAR | Status: AC
  Administered 2021-07-21: 10 mg via INTRAVENOUS
  Filled 2021-07-21: qty 2

## 2021-07-21 MED ORDER — IOHEXOL 350 MG/ML SOLN
100.0000 mL | Freq: Once | INTRAVENOUS | Status: AC | PRN
  Administered 2021-07-21: 100 mL via INTRAVENOUS
  Filled 2021-07-21: qty 100
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
## 2021-10-08 ENCOUNTER — Emergency Department
Admission: EM | Admit: 2021-10-08 | Discharge: 2021-10-09 | Disposition: A | Discharge status: Home / Self Care | Payer: Medicaid Other | Attending: Emergency Medicine | Admitting: Emergency Medicine

## 2021-10-08 ENCOUNTER — Emergency Department: Payer: Medicaid Other

## 2021-10-08 ENCOUNTER — Other Ambulatory Visit: Payer: Self-pay

## 2021-10-08 DIAGNOSIS — R079 Chest pain, unspecified: Secondary | ICD-10-CM | POA: Insufficient documentation

## 2021-10-08 DIAGNOSIS — R519 Headache, unspecified: Secondary | ICD-10-CM | POA: Insufficient documentation

## 2021-10-08 DIAGNOSIS — Z5321 Procedure and treatment not carried out due to patient leaving prior to being seen by health care provider: Secondary | ICD-10-CM | POA: Diagnosis not present

## 2021-10-08 DIAGNOSIS — M542 Cervicalgia: Secondary | ICD-10-CM | POA: Insufficient documentation

## 2021-10-08 LAB — CBC
HCT: 38.8 % (ref 36.0–46.0)
Hemoglobin: 12.1 g/dL (ref 12.0–15.0)
MCH: 22.3 pg — ABNORMAL LOW (ref 26.0–34.0)
MCHC: 31.2 g/dL (ref 30.0–36.0)
MCV: 71.5 fL — ABNORMAL LOW (ref 80.0–100.0)
Platelets: 407 10*3/uL — ABNORMAL HIGH (ref 150–400)
RBC: 5.43 MIL/uL — ABNORMAL HIGH (ref 3.87–5.11)
RDW: 15.1 % (ref 11.5–15.5)
WBC: 9.5 10*3/uL (ref 4.0–10.5)
nRBC: 0 % (ref 0.0–0.2)

## 2021-10-08 LAB — TROPONIN I (HIGH SENSITIVITY): Troponin I (High Sensitivity): <2 ng/L (ref <18)

## 2021-10-08 LAB — BASIC METABOLIC PANEL
Anion gap: 8 (ref 5–15)
BUN: 16 mg/dL (ref 6–20)
CO2: 26 mmol/L (ref 22–32)
Calcium: 9.8 mg/dL (ref 8.9–10.3)
Chloride: 103 mmol/L (ref 98–111)
Creatinine, Ser: 0.79 mg/dL (ref 0.44–1.00)
GFR, Estimated: >60 mL/min (ref >60)
Glucose, Bld: 87 mg/dL (ref 70–99)
Potassium: 3.9 mmol/L (ref 3.5–5.1)
Sodium: 137 mmol/L (ref 135–145)

## 2021-10-08 NOTE — ED Triage Notes (Signed)
Pt comes into the ED via [facility] from home c/o CP that started last night.  Pt took 4 baby aspirin.  Pain is worse with movement when laying and sitting.  Pt states the pressure is worse when laying flat.  Ambulatory on scene and all VSS, EKG unremarkable.  Pt had even and unlabored respirations.

## 2021-10-08 NOTE — ED Triage Notes (Signed)
See first nurse note. Pt reports chest pain radiating to left arm and neck with headache that started last night. NAD noted

## 2021-10-09 NOTE — ED Notes (Signed)
No answer when called several times from lobby 

## 2021-11-09 IMAGING — CT CT RENAL STONE PROTOCOL
2 of 4 series · 16 of 46 positions shown, 18 images · non-contrast
Comparison: 06/21/2017 CT abdomen/pelvis.

CLINICAL DATA: Low back and right flank pain with nausea and
vomiting. History of nephrolithiasis.

EXAM:
CT ABDOMEN AND PELVIS WITHOUT CONTRAST
TECHNIQUE: Multidetector CT imaging of the abdomen and pelvis was performed
following the standard protocol without IV contrast.

[Series 2: stone full standard · axial · 0.80mm/px · z∈[-952,-507]mm · 13 of 99 slices shown, 15 images]
[im 5/99  soft-tissue]
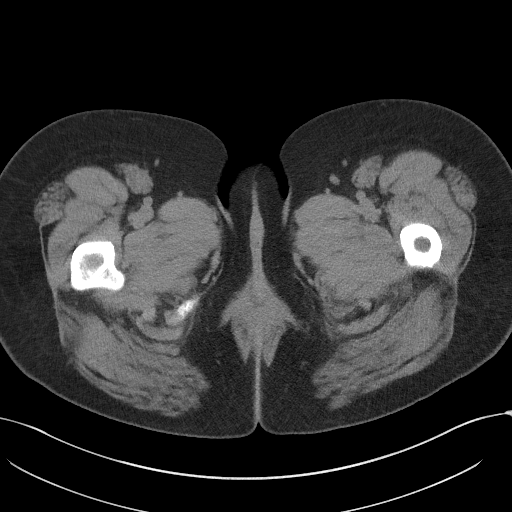
[im 5/99  bone]
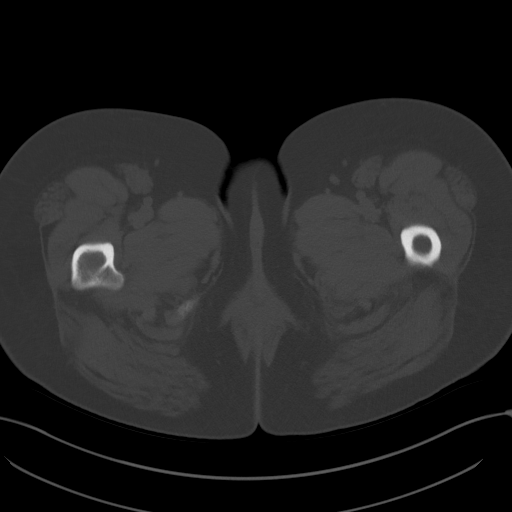
[im 13/99  soft-tissue]
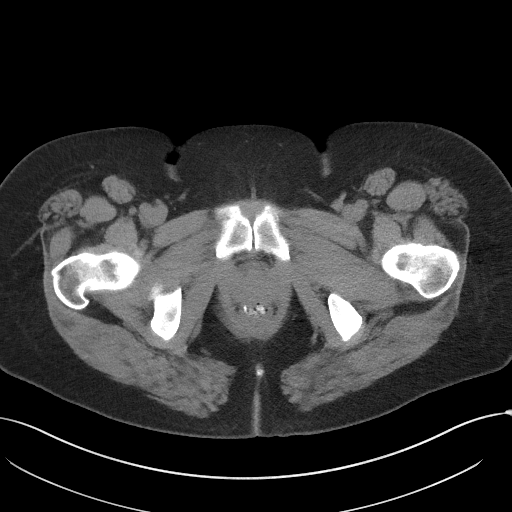
[im 21/99  soft-tissue]
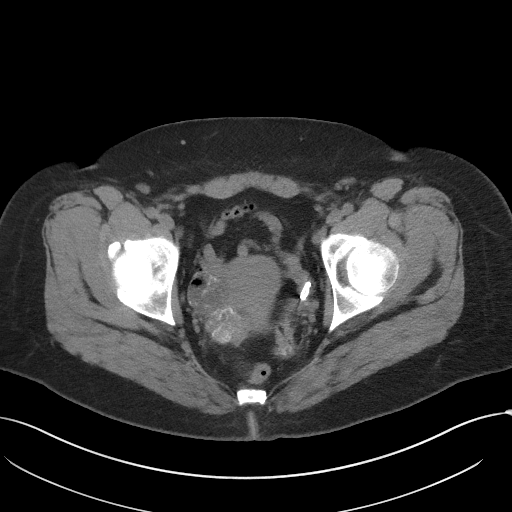
[im 29/99  soft-tissue]
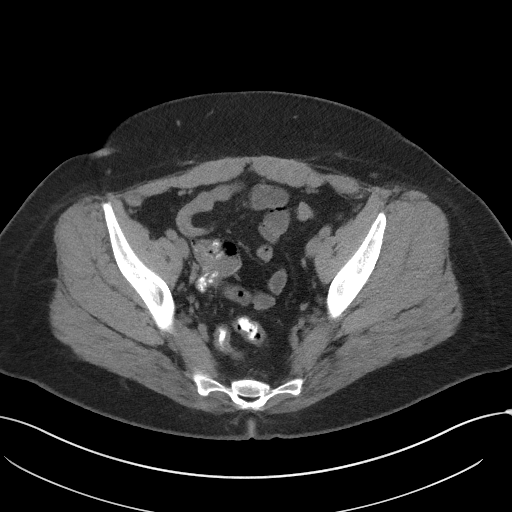
[im 33/99  soft-tissue]
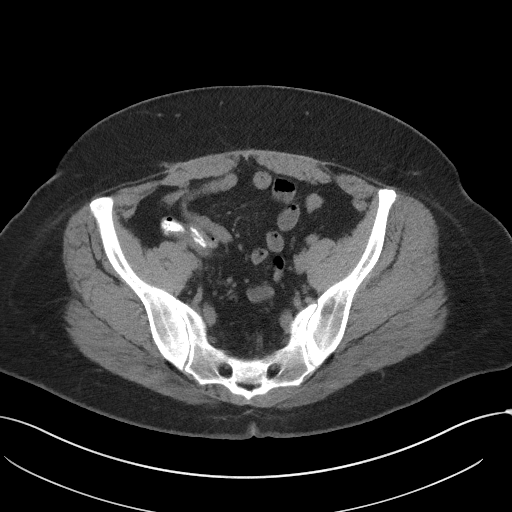
[im 41/99  soft-tissue]
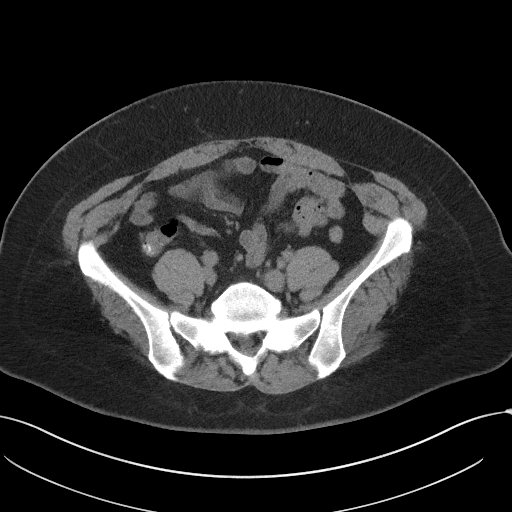
[im 50/99  soft-tissue]
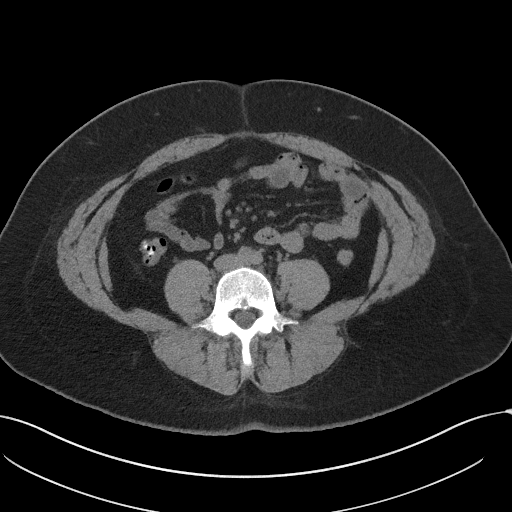
[im 58/99  soft-tissue]
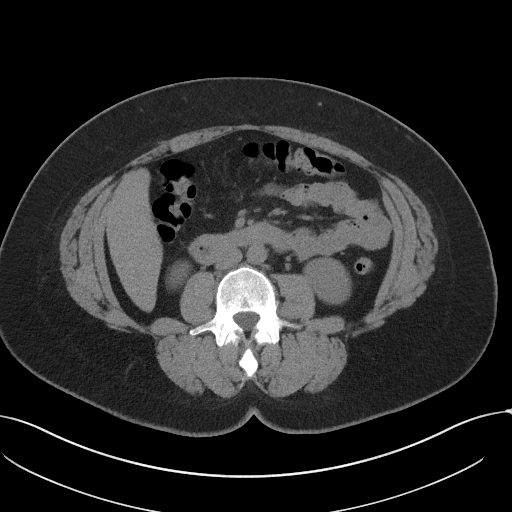
[im 66/99  soft-tissue]
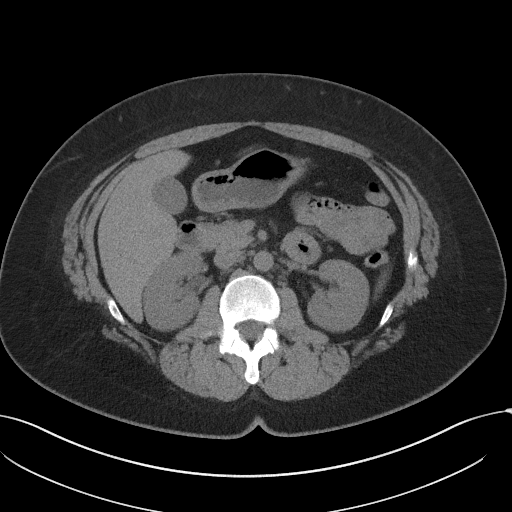
[im 66/99  bone]
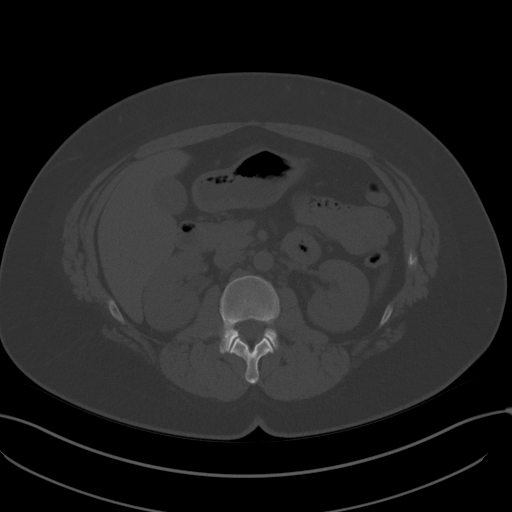
[im 70/99  soft-tissue]
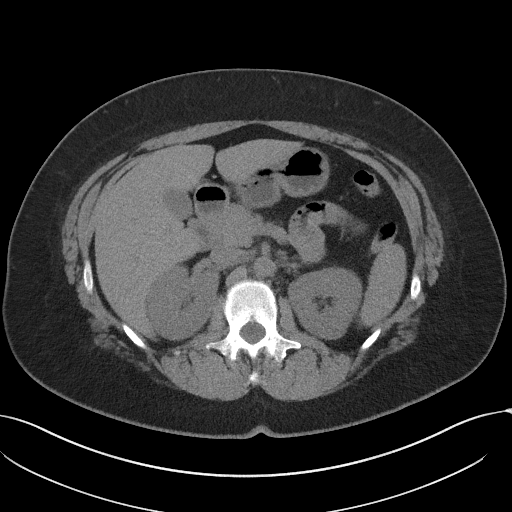
[im 78/99  soft-tissue]
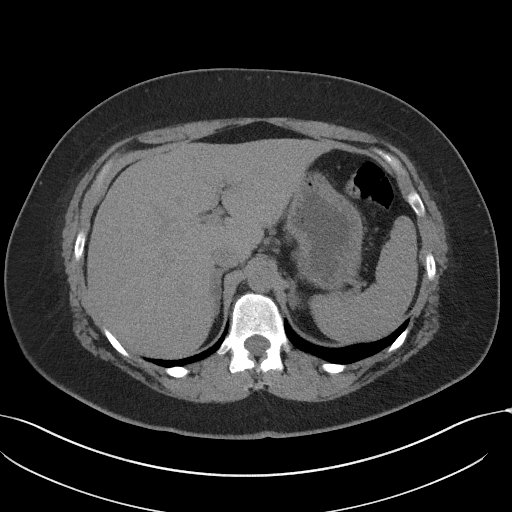
[im 86/99  soft-tissue]
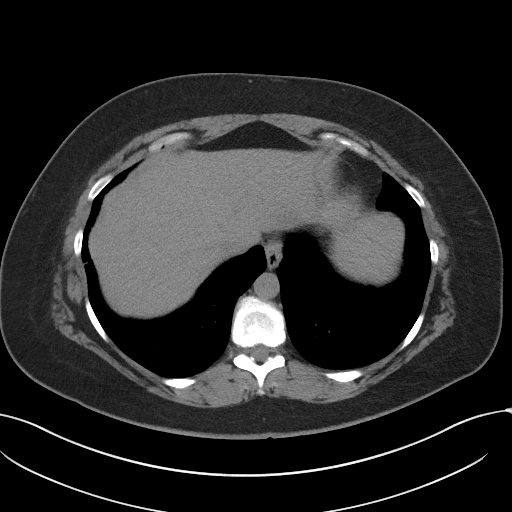
[im 94/99  soft-tissue]
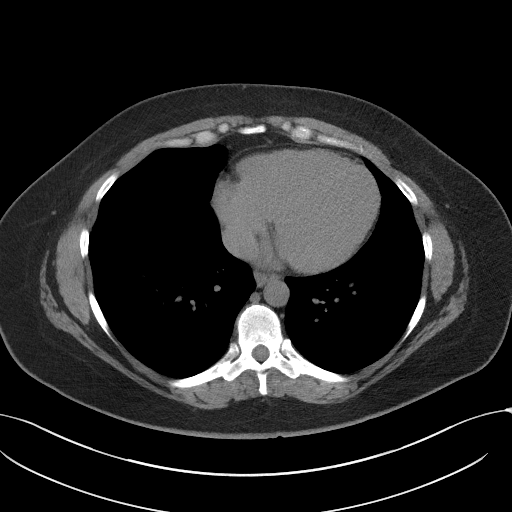

[Series 5: coronal · coronal · 0.89mm/px · 3 of 152 slices shown]
[im 51/152  soft-tissue]
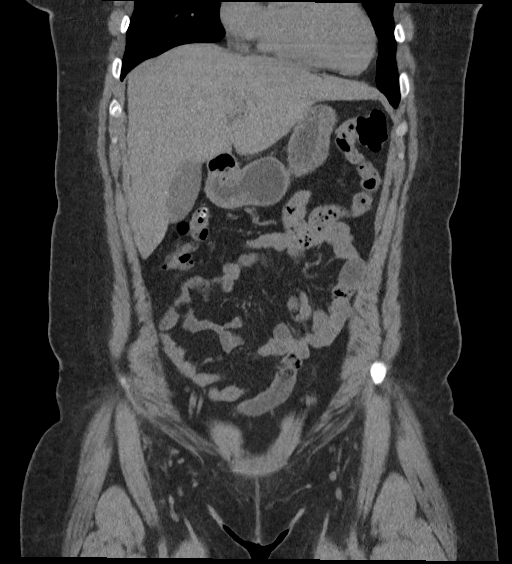
[im 68/152  soft-tissue]
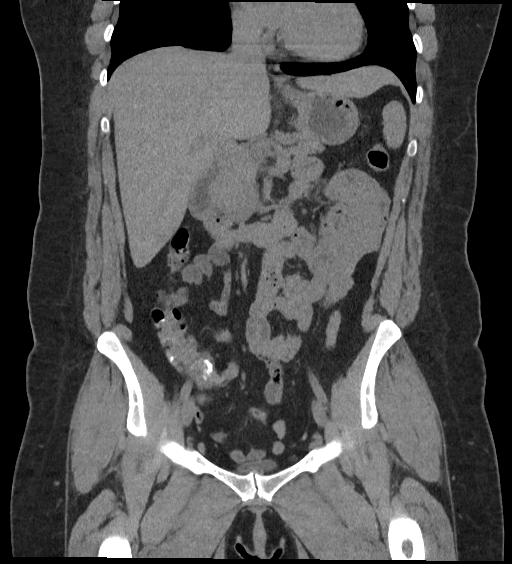
[im 84/152  soft-tissue]
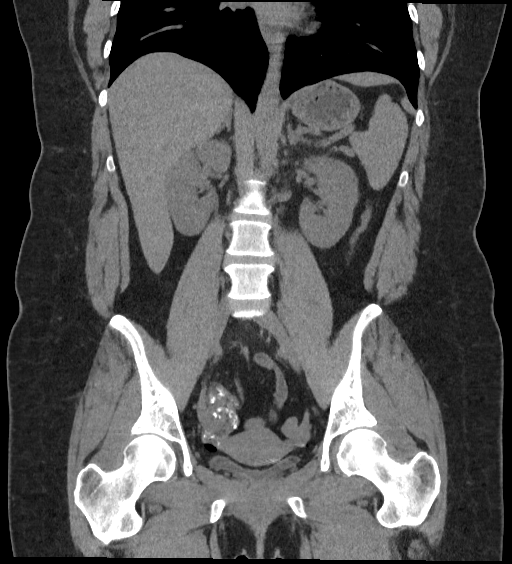

[16 of 46 positions shown; findings below may reference images not displayed]

FINDINGS: Lower chest: No significant pulmonary nodules or acute consolidative
airspace disease.

Hepatobiliary: Normal liver size. No liver mass. Normal gallbladder
with no radiopaque cholelithiasis. No biliary ductal dilatation.

Pancreas: Normal, with no mass or duct dilation.

Spleen: Normal size. No mass.

Adrenals/Urinary Tract: Normal adrenals. No renal stones. No
hydronephrosis. No contour deforming renal masses. Normal caliber
ureters. No ureteral stones. Normal nondistended bladder.

Stomach/Bowel: Normal non-distended stomach. Normal caliber small
bowel with no small bowel wall thickening. Normal appendix. Normal
large bowel with no diverticulosis, large bowel wall thickening or
pericolonic fat stranding.

Vascular/Lymphatic: Normal caliber abdominal aorta. No
pathologically enlarged lymph nodes in the abdomen or pelvis.

Reproductive: Normal uterus. No adnexal masses. There are two tubal
ligation clips in the left adnexal region (series 2/images 79 and
83).

Other: No pneumoperitoneum, ascites or focal fluid collection. Tiny
fat containing umbilical hernia is stable.

Musculoskeletal: No aggressive appearing focal osseous lesions.
IMPRESSION: 1. No acute abnormality. No urolithiasis. No hydronephrosis.
2. Two tubal ligation clips in the left adnexal region, one of which
may represent a migrated right adnexal tubal ligation clip.

## 2022-02-10 ENCOUNTER — Other Ambulatory Visit: Payer: Self-pay | Admitting: Family Medicine

## 2022-02-10 DIAGNOSIS — Z1231 Encounter for screening mammogram for malignant neoplasm of breast: Secondary | ICD-10-CM

## 2022-03-03 DIAGNOSIS — Z1231 Encounter for screening mammogram for malignant neoplasm of breast: Secondary | ICD-10-CM

## 2022-04-28 ENCOUNTER — Ambulatory Visit: Payer: Medicaid Other | Admitting: Physician Assistant

## 2022-04-28 ENCOUNTER — Encounter: Payer: Self-pay | Admitting: Physician Assistant

## 2022-04-28 VITALS — BP 131/86 | HR 83 | Ht 64.0 in | Wt 195.6 lb

## 2022-04-28 DIAGNOSIS — K219 Gastro-esophageal reflux disease without esophagitis: Secondary | ICD-10-CM

## 2022-04-28 DIAGNOSIS — F319 Bipolar disorder, unspecified: Secondary | ICD-10-CM | POA: Diagnosis not present

## 2022-04-28 DIAGNOSIS — K921 Melena: Secondary | ICD-10-CM

## 2022-04-28 DIAGNOSIS — L853 Xerosis cutis: Secondary | ICD-10-CM

## 2022-04-28 DIAGNOSIS — D649 Anemia, unspecified: Secondary | ICD-10-CM | POA: Diagnosis not present

## 2022-04-28 DIAGNOSIS — K429 Umbilical hernia without obstruction or gangrene: Secondary | ICD-10-CM

## 2022-04-28 DIAGNOSIS — E669 Obesity, unspecified: Secondary | ICD-10-CM

## 2022-04-28 DIAGNOSIS — Z1231 Encounter for screening mammogram for malignant neoplasm of breast: Secondary | ICD-10-CM

## 2022-04-28 MED ORDER — PANTOPRAZOLE SODIUM 40 MG PO TBEC: 40.0000 mg | DELAYED_RELEASE_TABLET | Freq: Every day | ORAL | 1 refills | Status: AC

## 2022-04-28 NOTE — Assessment & Plan Note (Signed)
Managed by [facility].

## 2022-04-28 NOTE — Progress Notes (Signed)
I,Sha'taria Tyson,acting as a Education administrator for [employer], PA-C.,have documented all relevant documentation on the behalf of Mikey Kirschner, PA-C,as directed by  Mikey Kirschner, PA-C while in the presence of Mikey Kirschner, PA-C.  New patient visit   Patient: Dominique Dyer   DOB: 1981-06-07   41 y.o. Female  MRN: [id number] Visit Date: 04/28/2022  Today's healthcare provider: Mikey Kirschner, PA-C   Cc. Several concerns  Subjective    Dominique Dyer is a 41 y.o. female who presents today as a new patient to establish care.  HPI  Abdominal pain -Burning pain, h/o umbilical hernia, previously (2021) had a consult to get it reduced, but she feels ready for surgery now. Concerns over a burning ulcer reports black mushy stools. She has been using pepto bismol 1-2 times a day for the last few months.   Anemia -h/o of thalassemia trait w/ IDA used to see hematology for infusions and vit b12 shots.   Cracks on hands/feet -Worried about infection. Denies pain, but reports smell occasionally, very dry hands and feet.    Past Medical History:  Diagnosis Date   Allergy    Anemia 2019   iron deficiency and b12 deficiency   Anginal pain ([place]) 2019   with pregnancy. ekg fine. cardiology reviewed   Anxiety    Arthritis    Bipolar disorder ([place])    Depression    Dyspnea    with pregnancy   Dysrhythmia 2019   tachycardia with pregnancy and panic attacks   GERD (gastroesophageal reflux disease)    Hypertension 2019   with pregnancy   Migraines    has not had as many recently.  at most, once a month   Panic attacks    Pica 2019   ice   Past Surgical History:  Procedure Laterality Date   CESAREAN SECTION     CESAREAN SECTION Bilateral 04/14/2018   Procedure: REPEAT CESAREAN SECTION WITH BTL;  Surgeon: [name], MD;  Location: [facility] ORS;  Service: Obstetrics;  Laterality: Bilateral;  Birth: 10[birth year] Sex: Female Weight: 8 lbs 3 oz    COLPOSCOPY  2006   TUBAL LIGATION   2019   Family Status  Relation Name Status   Mother  Alive   Father  Alive   Son  (Not Specified)   Annamarie Major  (Not Specified)   MGM  (Not Specified)   MGF  (Not Specified)   PGM  (Not Specified)   PGF  (Not Specified)   Family History  Problem Relation Age of Onset   Heart attack Mother 26   Diabetes Mother    Anxiety disorder Mother    Seizures Father    Autism Son    Cancer Paternal Uncle    Diabetic kidney disease Paternal Uncle    Anxiety disorder Maternal Grandmother    OCD Maternal Grandmother    Bipolar disorder Maternal Grandmother    Schizophrenia Maternal Grandmother    Cancer Maternal Grandfather    Thyroid disease Paternal Grandmother    Heart attack Paternal Grandfather    Social History   Socioeconomic History   Marital status: Widowed    Spouse name: Not on file   Number of children: Not on file   Years of education: Not on file   Highest education level: Not on file  Occupational History   Not on file  Tobacco Use   Smoking status: Former    Types: Cigarettes   Smokeless tobacco: Never  Vaping Use  Vaping Use: Never used  Substance and Sexual Activity   Alcohol use: No   Drug use: Not Currently    Types: Marijuana    Comment: last smoked 1 week ago   Sexual activity: Yes    Birth control/protection: Surgical    Comment: BTL  Other Topics Concern   Not on file  Social History Narrative   Not on file   Social Determinants of Health   Financial Resource Strain: Not on file  Food Insecurity: Not on file  Transportation Needs: Not on file  Physical Activity: Not on file  Stress: Not on file  Social Connections: Not on file   Outpatient Medications Prior to Visit  Medication Sig   ALPRAZolam (XANAX) 0.5 MG tablet Take 0.5 mg by mouth 3 (three) times daily as needed.   lamoTRIgine (LAMICTAL) 200 MG tablet Take 400 mg by mouth daily.   REXULTI 3 MG TABS Take 1 tablet by mouth daily.   diphenhydrAMINE-PE-APAP 25-5-325 MG TABS Take 2  tablets by mouth daily as needed (for congestion).   [DISCONTINUED] ALPRAZolam (XANAX) 0.25 MG tablet Take 0.25 mg by mouth 3 (three) times daily. (Patient not taking: Reported on 07/11/2020)   [DISCONTINUED] doxepin (SINEQUAN) 25 MG capsule Take 25 mg by mouth at bedtime. (Patient not taking: Reported on 07/11/2020)   [DISCONTINUED] doxepin (SINEQUAN) 75 MG capsule Take 75 mg by mouth at bedtime. (Patient not taking: Reported on 07/11/2020)   [DISCONTINUED] gabapentin (NEURONTIN) 100 MG capsule    [DISCONTINUED] lurasidone (LATUDA) 40 MG TABS tablet Take 40 mg by mouth daily with breakfast. (Patient not taking: Reported on 04/28/2022)   [DISCONTINUED] omeprazole (PRILOSEC) 20 MG capsule Take 20 mg by mouth daily.    [DISCONTINUED] oxybutynin (DITROPAN) 5 MG tablet Take 5 mg by mouth 3 (three) times daily.   [DISCONTINUED] oxybutynin (DITROPAN-XL) 5 MG 24 hr tablet Take 5 mg by mouth daily. (Patient not taking: Reported on 07/11/2020)   [DISCONTINUED] prazosin (MINIPRESS) 1 MG capsule Take 3 mg by mouth at bedtime. (Patient not taking: Reported on 07/11/2020)   No facility-administered medications prior to visit.   Allergies  Allergen Reactions   Aspirin Nausea Only and Other (See Comments)    Makes stomach bleed and vomits blood   Naproxen Nausea And Vomiting, Nausea Only and Other (See Comments)    Makes stomach bleed. Vomits blood   Sumatriptan Succinate Other (See Comments)    Tingling over entire body. Painful tingling in jaw/mouth   Lactose Intolerance (Gi) Other (See Comments)    Gi upset Can not take whole milk but ice cream and yogurt okay    Immunization History  Administered Date(s) Administered   DTP 10/07/1981, 11/04/1981, 12/02/1984, 04/08/1986   Hepatitis B 12/21/1999, 01/22/2000   MMR 04/08/1986, 10/21/1993   OPV 12/30/1981, 02/09/1982, 08/29/1995   Td 12/21/1999   Tdap 01/13/2015, 03/23/2015, 02/01/2018    Health Maintenance  Topic Date Due   COVID-19 Vaccine (1)  Never done   Hepatitis C Screening  Never done   PAP SMEAR-Modifier  10/24/2021   INFLUENZA VACCINE  04/20/2022   TETANUS/TDAP  02/02/2028   HIV Screening  Completed   HPV VACCINES  Aged Out    Patient Care Team: Mikey Kirschner, PA-C as PCP - General (Physician Assistant) [facility], Dolphus Jenny, MD as Referring Physician (Obstetrics and Gynecology) [name], MD as Consulting Physician (Obstetrics and Gynecology)  Review of Systems  Constitutional:  Positive for diaphoresis, fatigue and unexpected weight change.  HENT:  Positive for dental problem, postnasal drip, sinus pressure and tinnitus.   Gastrointestinal:  Positive for abdominal distention, abdominal pain, blood in stool and diarrhea.  Endocrine: Positive for polydipsia and polyuria.  Genitourinary:  Positive for difficulty urinating, flank pain and frequency.  Musculoskeletal:  Positive for arthralgias and back pain.  Neurological:  Positive for numbness and headaches.  Psychiatric/Behavioral:  Positive for agitation, behavioral problems, confusion, decreased concentration and dysphoric mood. The patient is nervous/anxious.       Objective    Blood pressure 131/86, pulse 83, height '5\' 4"'  (1.626 m), weight 195 lb 9.6 oz (88.7 kg), last menstrual period 03/27/2022, SpO2 100 %, unknown if currently breastfeeding.   Physical Exam Constitutional:      General: She is awake.     Appearance: She is well-developed.  HENT:     Head: Normocephalic.  Eyes:     Conjunctiva/sclera: Conjunctivae normal.  Cardiovascular:     Rate and Rhythm: Normal rate and regular rhythm.     Heart sounds: Normal heart sounds.  Pulmonary:     Effort: Pulmonary effort is normal.     Breath sounds: Normal breath sounds.  Abdominal:     Palpations: Abdomen is soft.     Tenderness: There is no abdominal tenderness.     Hernia: No hernia is present.  Skin:    General: Skin is warm.  Neurological:     Mental  Status: She is alert and oriented to person, place, and time.  Psychiatric:        Attention and Perception: Attention normal.        Mood and Affect: Mood normal.        Speech: Speech normal.        Behavior: Behavior is cooperative.    Depression Screen    04/28/2022    2:14 PM  PHQ 2/9 Scores  PHQ - 2 Score 4  PHQ- 9 Score 22   No results found for any visits on 04/28/22.  Assessment & Plan      Problem List Items Addressed This Visit       Digestive   GERD (gastroesophageal reflux disease)    Will start on pantoprazole 40 mg in AM daily Advised ref to GI Advised to stop pepto bismol for 2 weeks to see if this changes her stool  Will check cbc for potentially progressing anemia      Relevant Medications   pantoprazole (PROTONIX) 40 MG tablet     Musculoskeletal and Integument   Dry skin    Hands and feet with dry cracked skin  Advised heavy duty moisturizer-- to keep them dry besides hand washing/cleaning etc        Other   Bipolar 1 disorder ([place])    Managed by [facility].      Relevant Orders   TSH + free T4   Umbilical hernia without obstruction and without gangrene - Primary    I do not appreciate anything on exam today, I do see the CT scan in 2021 and surgical consult for a 1.8 cm x 1 cm fat containing hernia.  I advised pt this is likely too small to be causing issues, but pt would like to have it reduced. Ref placed      Relevant Orders   Ambulatory referral to General Surgery   Anemia    From previous heme: background thalassemia trait is favored, along with iron deficiency.  Concerned it could be exacerbated by potential gi bleeding, will  order labs and compare to previous      Relevant Orders   CBC w/Diff/Platelet   Iron, TIBC and Ferritin Panel   Vitamin B12   Other Visit Diagnoses     Screening mammogram for breast cancer       Relevant Orders   MM 3D SCREEN BREAST BILATERAL   Melena       Relevant Orders    Ambulatory referral to Gastroenterology   Comprehensive Metabolic Panel (CMET)   Obesity (BMI 30-39.9)       Relevant Orders   HgB A1c   Lipid Profile        Return in about 8 weeks (around 06/23/2022) for gerd.     I, Mikey Kirschner, PA-C have reviewed all documentation for this visit. The documentation on  04/28/2022  for the exam, diagnosis, procedures, and orders are all accurate and complete.  Mikey Kirschner, PA-C [facility] [address] [place], [place], [address] Office: [phone] Fax: [place]

## 2022-04-28 NOTE — Assessment & Plan Note (Signed)
I do not appreciate anything on exam today, I do see the CT scan in 2021 and surgical consult for a 1.8 cm x 1 cm fat containing hernia.  I advised pt this is likely too small to be causing issues, but pt would like to have it reduced. Ref placed

## 2022-04-28 NOTE — Assessment & Plan Note (Signed)
From previous heme: background thalassemia trait is favored, along with iron deficiency.  Concerned it could be exacerbated by potential gi bleeding, will order labs and compare to previous

## 2022-04-28 NOTE — Assessment & Plan Note (Signed)
Hands and feet with dry cracked skin  Advised heavy duty moisturizer-- to keep them dry besides hand washing/cleaning etc

## 2022-04-28 NOTE — Assessment & Plan Note (Signed)
Will start on pantoprazole 40 mg in AM daily Advised ref to GI Advised to stop pepto bismol for 2 weeks to see if this changes her stool  Will check cbc for potentially progressing anemia

## 2022-04-29 LAB — CBC WITH DIFFERENTIAL/PLATELET
Basophils Absolute: 0.1 10*3/uL (ref 0.0–0.2)
Basos: 1 %
EOS (ABSOLUTE): 0 10*3/uL (ref 0.0–0.4)
Eos: 0 %
Hematocrit: 36.4 % (ref 34.0–46.6)
Hemoglobin: 11.6 g/dL (ref 11.1–15.9)
Immature Grans (Abs): 0 10*3/uL (ref 0.0–0.1)
Immature Granulocytes: 0 %
Lymphocytes Absolute: 2.7 10*3/uL (ref 0.7–3.1)
Lymphs: 30 %
MCH: 22.1 pg — ABNORMAL LOW (ref 26.6–33.0)
MCHC: 31.9 g/dL (ref 31.5–35.7)
MCV: 70 fL — ABNORMAL LOW (ref 79–97)
Monocytes Absolute: 0.6 10*3/uL (ref 0.1–0.9)
Monocytes: 7 %
Neutrophils Absolute: 5.5 10*3/uL (ref 1.4–7.0)
Neutrophils: 62 %
Platelets: 348 10*3/uL (ref 150–450)
RBC: 5.24 x10E6/uL (ref 3.77–5.28)
RDW: 13.3 % (ref 11.7–15.4)
WBC: 8.9 10*3/uL (ref 3.4–10.8)

## 2022-04-29 LAB — COMPREHENSIVE METABOLIC PANEL
ALT: 15 IU/L (ref 0–32)
AST: 17 IU/L (ref 0–40)
Albumin/Globulin Ratio: 1.8 (ref 1.2–2.2)
Albumin: 4.8 g/dL (ref 3.9–4.9)
Alkaline Phosphatase: 67 IU/L (ref 44–121)
BUN/Creatinine Ratio: 12 (ref 9–23)
BUN: 10 mg/dL (ref 6–24)
Bilirubin Total: 0.2 mg/dL (ref 0.0–1.2)
CO2: 24 mmol/L (ref 20–29)
Calcium: 10.1 mg/dL (ref 8.7–10.2)
Chloride: 101 mmol/L (ref 96–106)
Creatinine, Ser: 0.83 mg/dL (ref 0.57–1.00)
Globulin, Total: 2.6 g/dL (ref 1.5–4.5)
Glucose: 85 mg/dL (ref 70–99)
Potassium: 4.4 mmol/L (ref 3.5–5.2)
Sodium: 141 mmol/L (ref 134–144)
Total Protein: 7.4 g/dL (ref 6.0–8.5)
eGFR: 91 mL/min/{1.73_m2} (ref >59)

## 2022-04-29 LAB — VITAMIN B12: Vitamin B-12: 423 pg/mL (ref 232–1245)

## 2022-04-29 LAB — LIPID PANEL
Chol/HDL Ratio: 3.3 ratio (ref 0.0–4.4)
Cholesterol, Total: 184 mg/dL (ref 100–199)
HDL: 55 mg/dL (ref >39)
LDL Chol Calc (NIH): 118 mg/dL — ABNORMAL HIGH (ref 0–99)
Triglycerides: 57 mg/dL (ref 0–149)
VLDL Cholesterol Cal: 11 mg/dL (ref 5–40)

## 2022-04-29 LAB — TSH+FREE T4
Free T4: 1.26 ng/dL (ref 0.82–1.77)
TSH: 1.2 u[IU]/mL (ref 0.450–4.500)

## 2022-04-29 LAB — HEMOGLOBIN A1C
Est. average glucose Bld gHb Est-mCnc: 117 mg/dL
Hgb A1c MFr Bld: 5.7 % — ABNORMAL HIGH (ref 4.8–5.6)

## 2022-04-29 LAB — IRON,TIBC AND FERRITIN PANEL
Ferritin: 37 ng/mL (ref 15–150)
Iron Saturation: 13 % — ABNORMAL LOW (ref 15–55)
Iron: 46 ug/dL (ref 27–159)
Total Iron Binding Capacity: 348 ug/dL (ref 250–450)
UIBC: 302 ug/dL (ref 131–425)

## 2022-04-30 ENCOUNTER — Telehealth: Payer: Self-pay

## 2022-04-30 NOTE — Telephone Encounter (Signed)
Copied from [place] [id number]. Topic: General - Other >> Apr 30, 2022 11:26 AM Sabas Sous wrote: Reason for CRM: Insurance called to report that this provider is not available to assign via [place]. It does not have to be officially changed for [place]ia Comment, she will still be covered if she is assigned under an MD.   Best contact: [place].

## 2022-04-30 NOTE — Telephone Encounter (Signed)
Not sure if this is for you or for billing?

## 2022-05-12 ENCOUNTER — Encounter: Payer: Medicaid Other | Admitting: Family Medicine

## 2022-05-19 ENCOUNTER — Ambulatory Visit: Payer: Medicaid Other | Admitting: Surgery

## 2022-07-27 ENCOUNTER — Encounter: Payer: Medicaid Other | Admitting: Obstetrics & Gynecology

## 2022-10-28 NOTE — Progress Notes (Deleted)
I,Sha'taria Shahrukh Pasch,acting as a Education administrator for [employer], PA-C.,have documented all relevant documentation on the behalf of Mikey Kirschner, PA-C,as directed by  Mikey Kirschner, PA-C while in the presence of Mikey Kirschner, PA-C.   Complete physical exam   Patient: Dominique Dyer   DOB: 03-17-1981   42 y.o. Female  MRN: [id number] Visit Date: 10/29/2022  Today's healthcare provider: Mikey Kirschner, PA-C   No chief complaint on file.  Subjective    Dominique Dyer is a 42 y.o. female who presents today for a complete physical exam.  She reports consuming a {diet types:17450} diet. {Exercise:19826} She generally feels {well/fairly well/poorly:18703}. She reports sleeping {well/fairly well/poorly:18703}. She {does/does not:200015} have additional problems to discuss today.  HPI  ***  Past Medical History:  Diagnosis Date   Allergy    Anemia 2019   iron deficiency and b12 deficiency   Anginal pain ([place]) 2019   with pregnancy. ekg fine. cardiology reviewed   Anxiety    Arthritis    Bipolar disorder ([place])    Depression    Dyspnea    with pregnancy   Dysrhythmia 2019   tachycardia with pregnancy and panic attacks   GERD (gastroesophageal reflux disease)    Hypertension 2019   with pregnancy   Migraines    has not had as many recently.  at most, once a month   Panic attacks    Pica 2019   ice   Past Surgical History:  Procedure Laterality Date   CESAREAN SECTION     CESAREAN SECTION Bilateral 04/14/2018   Procedure: REPEAT CESAREAN SECTION WITH BTL;  Surgeon: [name], MD;  Location: [facility] ORS;  Service: Obstetrics;  Laterality: Bilateral;  Birth: 10:[birth year] Sex: Female Weight: 8 lbs 3 oz    COLPOSCOPY  2006   TUBAL LIGATION  2019   Social History   Socioeconomic History   Marital status: Widowed    Spouse name: Not on file   Number of children: Not on file   Years of education: Not on file   Highest education level: Not on file  Occupational History    Not on file  Tobacco Use   Smoking status: Former    Types: Cigarettes   Smokeless tobacco: Never  Vaping Use   Vaping Use: Never used  Substance and Sexual Activity   Alcohol use: No   Drug use: Not Currently    Types: Marijuana    Comment: last smoked 1 week ago   Sexual activity: Yes    Birth control/protection: Surgical    Comment: BTL  Other Topics Concern   Not on file  Social History Narrative   Not on file   Social Determinants of Health   Financial Resource Strain: Not on file  Food Insecurity: Not on file  Transportation Needs: Not on file  Physical Activity: Not on file  Stress: Not on file  Social Connections: Not on file  Intimate Partner Violence: Not on file   Family Status  Relation Name Status   Mother  Alive   Father  Alive   Son  (Not Specified)   Annamarie Major  (Not Specified)   MGM  (Not Specified)   MGF  (Not Specified)   PGM  (Not Specified)   PGF  (Not Specified)   Family History  Problem Relation Age of Onset   Heart attack Mother 25   Diabetes Mother    Anxiety disorder Mother    Seizures Father    Autism  Son    Cancer Paternal Uncle    Diabetic kidney disease Paternal Uncle    Anxiety disorder Maternal Grandmother    OCD Maternal Grandmother    Bipolar disorder Maternal Grandmother    Schizophrenia Maternal Grandmother    Cancer Maternal Grandfather    Thyroid disease Paternal Grandmother    Heart attack Paternal Grandfather    Allergies  Allergen Reactions   Aspirin Nausea Only and Other (See Comments)    Makes stomach bleed and vomits blood   Naproxen Nausea And Vomiting, Nausea Only and Other (See Comments)    Makes stomach bleed. Vomits blood   Sumatriptan Succinate Other (See Comments)    Tingling over entire body. Painful tingling in jaw/mouth   Lactose Intolerance (Gi) Other (See Comments)    Gi upset Can not take whole milk but ice cream and yogurt okay    Patient Care Team: Mikey Kirschner, PA-C as PCP - General  (Physician Assistant) [facility], Dolphus Jenny, MD as Referring Physician (Obstetrics and Gynecology) Amalia Hailey Nyoka Lint, MD as Consulting Physician (Obstetrics and Gynecology)   Medications: Outpatient Medications Prior to Visit  Medication Sig   ALPRAZolam (XANAX) 0.5 MG tablet Take 0.5 mg by mouth 3 (three) times daily as needed.   diphenhydrAMINE-PE-APAP 25-5-325 MG TABS Take 2 tablets by mouth daily as needed (for congestion).   lamoTRIgine (LAMICTAL) 200 MG tablet Take 400 mg by mouth daily.   pantoprazole (PROTONIX) 40 MG tablet Take 1 tablet (40 mg total) by mouth daily.   REXULTI 3 MG TABS Take 1 tablet by mouth daily.   No facility-administered medications prior to visit.    Review of Systems  {Labs  Heme  Chem  Endocrine  Serology  Results Review (optional):23779}  Objective    There were no vitals taken for this visit. {Show previous vital signs (optional):23777}   Physical Exam  ***  Last depression screening scores    04/28/2022    2:14 PM  PHQ 2/9 Scores  PHQ - 2 Score 4  PHQ- 9 Score 22   Last fall risk screening    04/28/2022    2:14 PM  E[place] in the past year? 1  Number falls in past yr: 1  Injury with Fall? 1  Risk for fall due to : History of fall(s)   Last Audit-C alcohol use screening    04/28/2022    2:13 PM  Alcohol Use Disorder Test (AUDIT)  1. How often do you have a drink containing alcohol? 0  2. How many drinks containing alcohol do you have on a typical day when you are drinking? 0  3. How often do you have six or more drinks on one occasion? 0  AUDIT-C Score 0   A score of 3 or more in women, and 4 or more in men indicates increased risk for alcohol abuse, EXCEPT if all of the points are from question 1   No results found for any visits on 10/29/22.  Assessment & Plan    Routine Health Maintenance and Physical Exam  Exercise Activities and Dietary recommendations  Goals   None      Immunization History  Administered Date(s) Administered   DTP 10/07/1981, 11/04/1981, 12/02/1984, 04/08/1986   Hepatitis B 12/21/1999, 01/22/2000   MMR 04/08/1986, 10/21/1993   OPV 12/30/1981, 02/09/1982, 08/29/1995   Td 12/21/1999   Tdap 01/13/2015, 03/23/2015, 02/01/2018    Health Maintenance  Topic Date Due   COVID-19 Vaccine (1) Never  done   Hepatitis C Screening  Never done   PAP SMEAR-Modifier  10/24/2021   INFLUENZA VACCINE  Never done   DTaP/Tdap/Td (9 - Td or Tdap) 02/02/2028   HIV Screening  Completed   HPV VACCINES  Aged Out    Discussed health benefits of physical activity, and encouraged her to engage in regular exercise appropriate for her age and condition.  ***  No follow-ups on file.     {provider attestation***:1}   Mikey Kirschner, PA-C  [place] [phone] (phone) [phone] (fax)  [place]

## 2022-10-29 ENCOUNTER — Ambulatory Visit: Payer: Medicaid Other | Admitting: Physician Assistant

## 2022-12-11 ENCOUNTER — Ambulatory Visit
Admission: EM | Admit: 2022-12-11 | Discharge: 2022-12-11 | Disposition: A | Discharge status: Home / Self Care | Payer: Medicaid Other | Attending: Emergency Medicine | Admitting: Emergency Medicine

## 2022-12-11 ENCOUNTER — Ambulatory Visit (INDEPENDENT_AMBULATORY_CARE_PROVIDER_SITE_OTHER): Payer: Medicaid Other

## 2022-12-11 DIAGNOSIS — R0789 Other chest pain: Secondary | ICD-10-CM | POA: Diagnosis not present

## 2022-12-11 DIAGNOSIS — R0781 Pleurodynia: Secondary | ICD-10-CM
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
## 2022-12-11 NOTE — Discharge Instructions (Addendum)
Take Tylenol or ibuprofen as needed for discomfort.  Follow up with your primary care provider if your symptoms are not improving.    Go to the emergency department if you have chest pain, shortness of breath, or other concerning symptoms.

## 2023-05-20 ENCOUNTER — Other Ambulatory Visit: Payer: Self-pay

## 2023-05-20 ENCOUNTER — Emergency Department
Admission: EM | Admit: 2023-05-20 | Discharge: 2023-05-20 | Disposition: A | Discharge status: Home / Self Care | Payer: Medicaid Other | Attending: Emergency Medicine | Admitting: Emergency Medicine

## 2023-05-20 DIAGNOSIS — I10 Essential (primary) hypertension: Secondary | ICD-10-CM | POA: Insufficient documentation

## 2023-05-20 DIAGNOSIS — M5442 Lumbago with sciatica, left side: Secondary | ICD-10-CM | POA: Insufficient documentation

## 2023-05-20 DIAGNOSIS — M5432 Sciatica, left side: Secondary | ICD-10-CM

## 2023-05-20 DIAGNOSIS — M545 Low back pain, unspecified: Secondary | ICD-10-CM | POA: Diagnosis present

## 2023-05-20 MED ORDER — CYCLOBENZAPRINE HCL 5 MG PO TABS: 5.0000 mg | ORAL_TABLET | Freq: Three times a day (TID) | ORAL | 0 refills | Status: DC | PRN

## 2023-05-20 MED ORDER — DEXAMETHASONE SODIUM PHOSPHATE 10 MG/ML IJ SOLN
10.0000 mg | Freq: Once | INTRAMUSCULAR | Status: AC
Start: 2023-05-20 — End: 2023-05-20
  Administered 2023-05-20: 10 mg via INTRAMUSCULAR
  Filled 2023-05-20: qty 1

## 2023-05-20 MED ORDER — MELOXICAM 15 MG PO TABS: 15.0000 mg | ORAL_TABLET | Freq: Every day | ORAL | 0 refills | Status: AC

## 2023-05-20 NOTE — ED Triage Notes (Addendum)
Pt presents to ED with c/o of L sciatic leg issues, pt states HX of same. No issues with bowel or bladder incontinence. NAD noted.

## 2023-05-20 NOTE — Discharge Instructions (Signed)
Please take the meloxicam once a day for 2 weeks.  You can take the muscle relaxer 3 times daily as needed, however I recommend that you take it only at night as it can make you sleepy.  Do not drive after taking this medication.  You can continue to apply ice and heat to the area, as well as other topical pain relievers.  Follow-up with orthopedics if you continue to have symptoms.  I have also attached some exercises that you can tried to help relieve the pain.  This can be especially helpful if it is going to take some time to get you into see a physical therapist.  You can return to the ED with any new or worsening symptoms.

## 2023-05-20 NOTE — ED Notes (Signed)
See triage note  Presents with left lower back pain which is moving into left leg  HX of sciatica  States she has tried OTC meds with TENS unit and ice  w/o relief  Denies any recent injury

## 2023-05-20 NOTE — ED Provider Notes (Signed)
[facility] Provider Note    Event Date/Time   First MD Initiated Contact with Patient 05/20/23 [phone]     (approximate)   History   Back Pain   HPI  Dominique Dyer is a 42 y.o. female with PMH of HTN, anemia, arthritis, bipolar presents for evaluation of back pain.  Patient has had pain like this before and believes it is related to her sciatic nerve.  Patient states that the pain starts in her buttock and radiates down her leg to the knee.  She describes the pain as sharp and burning.  She denies bladder and bowel issues.  She has tried ibuprofen, ice pack and a TENS unit at home.     Physical Exam   Triage Vital Signs: ED Triage Vitals [05/20/23 0703]  Encounter Vitals Group     BP 137/85     Systolic BP Percentile      Diastolic BP Percentile      Pulse Rate 79     Resp 17     Temp 97.8 F (36.6 C)     Temp Source Oral     SpO2 96 %     Weight 196 lb 3.4 oz (89 kg)     Height 5\' 4"  (1.626 m)     Head Circumference      Peak Flow      Pain Score 10     Pain Loc      Pain Education      Exclude from Growth Chart     Most recent vital signs: Vitals:   05/20/23 0703  BP: 137/85  Pulse: 79  Resp: 17  Temp: 97.8 F (36.6 C)  SpO2: 96%   General: Awake, no distress.  CV:  Good peripheral perfusion.  Resp:  Normal effort.  Abd:  No distention.  Other:  Tender to palpation on the left paraspinal muscles as well as the left buttock.  Patient is able to walk but does exacerbate her symptoms.   ED Results / Procedures / Treatments   Labs (all labs ordered are listed, but only abnormal results are displayed) Labs Reviewed - No data to display   PROCEDURES:  Critical Care performed: No  Procedures   MEDICATIONS ORDERED IN ED: Medications  dexamethasone (DECADRON) injection 10 mg (has no administration in time range)     IMPRESSION / MDM / ASSESSMENT AND PLAN / ED COURSE  I reviewed the triage vital signs and the nursing  notes.                             42 year old female presents for evaluation of left sciatic nerve pain.  VSS in triage and patient in mild distress on exam due to her pain, but is nontoxic-appearing.  Differential diagnosis includes, but is not limited to, sciatic nerve pain, muscle strain, lumbar spine fracture, shingles.  Patient's presentation is most consistent with acute, uncomplicated illness.  Patient's diagnosis is most consistent with sciatic nerve pain.  She will be given a Decadron shot today.  I will also start her on meloxicam and a muscle relaxer.  I advised patient to take the muscle relaxer at night as it will make her tired.  I will also give her information for orthopedic follow-up.  Patient voiced understanding, all questions were answered and she was stable at discharge.     FINAL CLINICAL IMPRESSION(S) / ED DIAGNOSES   Final diagnoses:  Sciatica of left side     Rx / DC Orders   ED Discharge Orders          Ordered    meloxicam (MOBIC) 15 MG tablet  Daily        05/20/23 0859    cyclobenzaprine (FLEXERIL) 5 MG tablet  3 times daily PRN        05/20/23 0859             Note:  This document was prepared using Dragon voice recognition software and may include unintentional dictation errors.   Cameron Ali, PA-C 05/20/23 0901    Sharman Cheek, MD 05/21/23 [phone]

## 2023-07-15 DIAGNOSIS — M5416 Radiculopathy, lumbar region: Secondary | ICD-10-CM | POA: Insufficient documentation

## 2023-07-15 DIAGNOSIS — M47816 Spondylosis without myelopathy or radiculopathy, lumbar region: Secondary | ICD-10-CM | POA: Insufficient documentation

## 2023-07-15 DIAGNOSIS — M4316 Spondylolisthesis, lumbar region: Secondary | ICD-10-CM | POA: Insufficient documentation

## 2023-10-29 ENCOUNTER — Other Ambulatory Visit: Payer: Self-pay

## 2023-10-29 ENCOUNTER — Emergency Department
Admission: EM | Admit: 2023-10-29 | Discharge: 2023-10-29 | Disposition: A | Discharge status: Home / Self Care | Payer: Medicaid Other | Attending: Emergency Medicine | Admitting: Emergency Medicine

## 2023-10-29 DIAGNOSIS — X102XXA Contact with fats and cooking oils, initial encounter: Secondary | ICD-10-CM | POA: Diagnosis not present

## 2023-10-29 DIAGNOSIS — S0592XA Unspecified injury of left eye and orbit, initial encounter: Secondary | ICD-10-CM | POA: Insufficient documentation

## 2023-10-29 DIAGNOSIS — S0590XA Unspecified injury of unspecified eye and orbit, initial encounter: Secondary | ICD-10-CM

## 2023-10-29 DIAGNOSIS — Y93G9 Activity, other involving cooking and grilling: Secondary | ICD-10-CM | POA: Diagnosis not present

## 2023-10-29 DIAGNOSIS — S0591XA Unspecified injury of right eye and orbit, initial encounter: Secondary | ICD-10-CM | POA: Insufficient documentation

## 2023-10-29 HISTORY — DX: Other intervertebral disc displacement, lumbar region: M51.26

## 2023-10-29 MED ORDER — FLUORESCEIN SODIUM 1 MG OP STRP
1.0000 | ORAL_STRIP | Freq: Once | OPHTHALMIC | Status: DC
  Filled 2023-10-29: qty 1

## 2023-10-29 MED ORDER — TETRACAINE HCL 0.5 % OP SOLN
2.0000 [drp] | Freq: Once | OPHTHALMIC | Status: DC
  Filled 2023-10-29: qty 4

## 2023-10-29 MED ORDER — TOBRAMYCIN 0.3 % OP SOLN: 2.0000 [drp] | OPHTHALMIC | 0 refills | Status: DC
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
## 2023-11-02 ENCOUNTER — Other Ambulatory Visit: Payer: Self-pay | Admitting: Medical Genetics

## 2023-11-07 ENCOUNTER — Other Ambulatory Visit
Admission: RE | Admit: 2023-11-07 | Discharge: 2023-11-07 | Disposition: A | Discharge status: Home / Self Care | Payer: Medicaid Other | Source: Ambulatory Visit | Attending: Medical Genetics | Admitting: Medical Genetics

## 2023-11-16 LAB — GENECONNECT MOLECULAR SCREEN: Genetic Analysis Overall Interpretation: NEGATIVE

## 2023-12-19 DIAGNOSIS — Z789 Other specified health status: Secondary | ICD-10-CM

## 2023-12-28 DIAGNOSIS — M7918 Myalgia, other site: Secondary | ICD-10-CM | POA: Insufficient documentation

## 2024-01-17 DIAGNOSIS — R7303 Prediabetes: Secondary | ICD-10-CM | POA: Insufficient documentation

## 2024-01-19 ENCOUNTER — Emergency Department
Admission: EM | Admit: 2024-01-19 | Discharge: 2024-01-19 | Disposition: A | Discharge status: Home / Self Care | Attending: Emergency Medicine | Admitting: Emergency Medicine

## 2024-01-19 ENCOUNTER — Encounter: Payer: Self-pay | Admitting: Emergency Medicine

## 2024-01-19 ENCOUNTER — Emergency Department

## 2024-01-19 DIAGNOSIS — R1011 Right upper quadrant pain: Secondary | ICD-10-CM | POA: Insufficient documentation

## 2024-01-19 DIAGNOSIS — R112 Nausea with vomiting, unspecified: Secondary | ICD-10-CM | POA: Diagnosis present

## 2024-01-19 LAB — COMPREHENSIVE METABOLIC PANEL WITH GFR
ALT: 20 U/L (ref 0–44)
AST: 20 U/L (ref 15–41)
Albumin: 4 g/dL (ref 3.5–5.0)
Alkaline Phosphatase: 54 U/L (ref 38–126)
Anion gap: 4 — ABNORMAL LOW (ref 5–15)
BUN: 16 mg/dL (ref 6–20)
CO2: 25 mmol/L (ref 22–32)
Calcium: 9.2 mg/dL (ref 8.9–10.3)
Chloride: 109 mmol/L (ref 98–111)
Creatinine, Ser: 0.76 mg/dL (ref 0.44–1.00)
GFR, Estimated: >60 mL/min (ref >60)
Glucose, Bld: 113 mg/dL — ABNORMAL HIGH (ref 70–99)
Potassium: 4.1 mmol/L (ref 3.5–5.1)
Sodium: 138 mmol/L (ref 135–145)
Total Bilirubin: 0.6 mg/dL (ref 0.0–1.2)
Total Protein: 7.5 g/dL (ref 6.5–8.1)

## 2024-01-19 LAB — POC URINE PREG, ED: Preg Test, Ur: NEGATIVE

## 2024-01-19 LAB — CBC
HCT: 38.3 % (ref 36.0–46.0)
Hemoglobin: 12 g/dL (ref 12.0–15.0)
MCH: 22.2 pg — ABNORMAL LOW (ref 26.0–34.0)
MCHC: 31.3 g/dL (ref 30.0–36.0)
MCV: 70.8 fL — ABNORMAL LOW (ref 80.0–100.0)
Platelets: 354 10*3/uL (ref 150–400)
RBC: 5.41 MIL/uL — ABNORMAL HIGH (ref 3.87–5.11)
RDW: 14.8 % (ref 11.5–15.5)
WBC: 8.1 10*3/uL (ref 4.0–10.5)
nRBC: 0 % (ref 0.0–0.2)

## 2024-01-19 LAB — URINALYSIS, ROUTINE W REFLEX MICROSCOPIC
Bilirubin Urine: NEGATIVE
Glucose, UA: NEGATIVE mg/dL
Hgb urine dipstick: NEGATIVE
Ketones, ur: NEGATIVE mg/dL
Leukocytes,Ua: NEGATIVE
Nitrite: NEGATIVE
Protein, ur: NEGATIVE mg/dL
Specific Gravity, Urine: 1.021 (ref 1.005–1.030)
pH: 7 (ref 5.0–8.0)

## 2024-01-19 LAB — LIPASE, BLOOD: Lipase: 41 U/L (ref 11–51)

## 2024-01-19 MED ORDER — DICYCLOMINE HCL 20 MG PO TABS: 20.0000 mg | ORAL_TABLET | Freq: Four times a day (QID) | ORAL | 0 refills | Status: DC | PRN

## 2024-01-19 MED ORDER — ONDANSETRON HCL 4 MG PO TABS: 4.0000 mg | ORAL_TABLET | Freq: Four times a day (QID) | ORAL | 0 refills | Status: AC | PRN

## 2024-01-19 MED ORDER — SODIUM CHLORIDE 0.9 % IV BOLUS
1000.0000 mL | Freq: Once | INTRAVENOUS | Status: AC
  Administered 2024-01-19: 1000 mL via INTRAVENOUS

## 2024-01-19 MED ORDER — MORPHINE SULFATE (PF) 4 MG/ML IV SOLN
4.0000 mg | Freq: Once | INTRAVENOUS | Status: AC
  Administered 2024-01-19: 4 mg via INTRAVENOUS
  Filled 2024-01-19: qty 1

## 2024-01-19 MED ORDER — ONDANSETRON HCL 4 MG/2ML IJ SOLN
4.0000 mg | Freq: Once | INTRAMUSCULAR | Status: AC
  Administered 2024-01-19: 4 mg via INTRAVENOUS
  Filled 2024-01-19: qty 2

## 2024-01-19 NOTE — ED Provider Notes (Signed)
 [facility] Provider Note    Event Date/Time   First MD Initiated Contact with Patient 01/19/24 1055     (approximate)   History   No chief complaint on file.   HPI  Dominique Dyer is a 43 year old female feeling to the right department for evaluation of abdominal pain and vomiting.  About 5 days ago patient had onset of multiple episodes of nonbloody vomiting with associated abdominal pain this notably in her right upper quadrant.  No diarrhea or blood in stool or emesis.  Has a history of kidney stones, but says this does not feel similar.  No fevers.  Saw her primary care doctor earlier in the week who suspected possible biliary pathology, recommended ER presentation if symptoms worsen.     Physical Exam   Triage Vital Signs: ED Triage Vitals  Encounter Vitals Group     BP 01/19/24 0947 128/79     Systolic BP Percentile --      Diastolic BP Percentile --      Pulse Rate 01/19/24 0947 87     Resp 01/19/24 0947 16     Temp 01/19/24 0948 98 F (36.7 C)     Temp Source 01/19/24 0947 Oral     SpO2 01/19/24 0947 99 %     Weight --      Height --      Head Circumference --      Peak Flow --      Pain Score 01/19/24 0948 6     Pain Loc --      Pain Education --      Exclude from Growth Chart --     Most recent vital signs: Vitals:   01/19/24 0948 01/19/24 1320  BP:  112/63  Pulse:  79  Resp:  16  Temp: 98 F (36.7 C) 98 F (36.7 C)  SpO2:  99%     General: Awake, interactive  CV:  Regular rate, good peripheral perfusion.  Resp:  Unlabored respirations, lungs clear auscultation Abd:  Nondistended, soft, mild tenderness throughout the abdomen most focally in the right upper quadrant with positive Murphy sign Neuro:  Symmetric facial movement, fluid speech   ED Results / Procedures / Treatments   Labs (all labs ordered are listed, but only abnormal results are displayed) Labs Reviewed  COMPREHENSIVE METABOLIC PANEL WITH GFR -  Abnormal; Notable for the following components:      Result Value   Glucose, Bld 113 (*)    Anion gap 4 (*)    All other components within normal limits  CBC - Abnormal; Notable for the following components:   RBC 5.41 (*)    MCV 70.8 (*)    MCH 22.2 (*)    All other components within normal limits  URINALYSIS, ROUTINE W REFLEX MICROSCOPIC - Abnormal; Notable for the following components:   Color, Urine YELLOW (*)    APPearance HAZY (*)    All other components within normal limits  LIPASE, BLOOD  POC URINE PREG, ED     EKG EKG independently reviewed interpreted by myself (ER attending) demonstrates:    RADIOLOGY Imaging independently reviewed and interpreted by myself demonstrates:  RUQ US  without evidence of gallstones or other acute findings  Formal Radiology Read:  US  ABDOMEN LIMITED RUQ (LIVER/GB) Result Date: 01/19/2024 CLINICAL DATA:  Right upper quadrant abdominal pain EXAM: ULTRASOUND ABDOMEN LIMITED RIGHT UPPER QUADRANT COMPARISON:  07/21/2021 FINDINGS: Gallbladder: Gallstones: None Sludge: None Gallbladder Wall: Within normal limits  Pericholecystic fluid: None Sonographic Murphy's Sign: Negative per technologist Common bile duct: Diameter: 3 mm Liver: Parenchymal echogenicity: Within normal limits Contours: Normal Lesions: None Portal vein: Patent.  Hepatopetal flow Other: None. IMPRESSION: No significant sonographic abnormality of the liver or gallbladder. Electronically Signed   By: Elester Grim M.D.   On: 01/19/2024 14:44    PROCEDURES:  Critical Care performed: No  Procedures   MEDICATIONS ORDERED IN ED: Medications  ondansetron  (ZOFRAN ) injection 4 mg (4 mg Intravenous Given 01/19/24 1157)  sodium chloride  0.9 % bolus 1,000 mL (1,000 mLs Intravenous New Bag/Given 01/19/24 1156)  morphine  (PF) 4 MG/ML injection 4 mg (4 mg Intravenous Given 01/19/24 1158)     IMPRESSION / MDM / ASSESSMENT AND PLAN / ED COURSE  I reviewed the triage vital signs and the nursing  notes.  Differential diagnosis includes, but is not limited to, biliary pathology including cholecystitis, biliary colic, choledocholithiasis, pancreatitis, gastritis, lower suspicion renal stone  Patient's presentation is most consistent with acute presentation with potential threat to life or bodily function.  43 year old female presenting with vomiting and abdominal pain.  Stable vitals on presentation.  Labs overall reassuring including normal white blood cell count.  Urine without evidence infection or blood. With concern for biliary pathology, will obtain right upper quadrant ultrasound to further evaluate and treat symptomatically with IV fluid, Zofran , morphine .   3:11 PM Ultrasound without evidence of gallstones or other acute findings.  Patient reassessed.  Feels much improved.  Has minimal ongoing right upper quadrant tenderness, remainder of abdomen nontender.  Lower suspicion for other acute intra-abdominal process.  I did discuss CT imaging versus discharge with outpatient follow-up and strict return precautions.  Patient feels much improved and is comfortable with discharge home.  Will DC with prescription for Zofran  and Bentyl .  Patient discharged in stable condition.     FINAL CLINICAL IMPRESSION(S) / ED DIAGNOSES   Final diagnoses:  Nausea and vomiting, unspecified vomiting type  Right upper quadrant pain     Rx / DC Orders   ED Discharge Orders          Ordered    dicyclomine  (BENTYL ) 20 MG tablet  Every 6 hours PRN        01/19/24 1511    ondansetron  (ZOFRAN ) 4 MG tablet  Every 6 hours PRN        01/19/24 1511             Note:  This document was prepared using Dragon voice recognition software and may include unintentional dictation errors.   Claria Crofts, MD 01/19/24 [phone]

## 2024-01-19 NOTE — Discharge Instructions (Addendum)
 You were seen in the Emergency Department today for evaluation of your abdominal pain. Fortunately, your labs, urine test, and ultrasound were overall reassuring against an emergency cause for your pain.  I have sent a prescription for a nausea medication as well as a medicine to help with your abdominal cramping to your pharmacy.  Please follow-up with your primary care doctor within the next few days for reevaluation. Return to the ER for any new or worsening symptoms including worsening pain, inability to tolerate food or liquids, or any other new or concerning symptoms.

## 2024-01-19 NOTE — ED Triage Notes (Signed)
 Arrives with C?O vomiting in the morning and abdominal pain x 2-3 days. Seen by PCP.  Has US  scheduled but this morning vomiting and RUQ abd pain.

## 2024-01-20 ENCOUNTER — Other Ambulatory Visit: Payer: Self-pay | Admitting: Family Medicine

## 2024-01-20 DIAGNOSIS — K219 Gastro-esophageal reflux disease without esophagitis: Secondary | ICD-10-CM

## 2024-01-20 DIAGNOSIS — R1011 Right upper quadrant pain: Secondary | ICD-10-CM

## 2024-01-30 ENCOUNTER — Encounter: Payer: Self-pay | Admitting: Urgent Care

## 2024-01-31 ENCOUNTER — Ambulatory Visit: Attending: Family Medicine

## 2024-02-06 DIAGNOSIS — M7918 Myalgia, other site: Secondary | ICD-10-CM | POA: Insufficient documentation

## 2024-03-09 ENCOUNTER — Other Ambulatory Visit: Payer: Self-pay

## 2024-03-09 ENCOUNTER — Emergency Department
Admission: EM | Admit: 2024-03-09 | Discharge: 2024-03-09 | Disposition: A | Discharge status: Home / Self Care | Attending: Emergency Medicine | Admitting: Emergency Medicine

## 2024-03-09 ENCOUNTER — Encounter: Payer: Self-pay | Admitting: Intensive Care

## 2024-03-09 ENCOUNTER — Emergency Department

## 2024-03-09 DIAGNOSIS — I10 Essential (primary) hypertension: Secondary | ICD-10-CM | POA: Insufficient documentation

## 2024-03-09 DIAGNOSIS — R0789 Other chest pain: Secondary | ICD-10-CM

## 2024-03-09 DIAGNOSIS — F419 Anxiety disorder, unspecified: Secondary | ICD-10-CM | POA: Diagnosis not present

## 2024-03-09 DIAGNOSIS — R0602 Shortness of breath: Secondary | ICD-10-CM | POA: Insufficient documentation

## 2024-03-09 LAB — CBC
HCT: 38.2 % (ref 36.0–46.0)
Hemoglobin: 11.7 g/dL — ABNORMAL LOW (ref 12.0–15.0)
MCH: 21.7 pg — ABNORMAL LOW (ref 26.0–34.0)
MCHC: 30.6 g/dL (ref 30.0–36.0)
MCV: 70.7 fL — ABNORMAL LOW (ref 80.0–100.0)
Platelets: 360 10*3/uL (ref 150–400)
RBC: 5.4 MIL/uL — ABNORMAL HIGH (ref 3.87–5.11)
RDW: 14.6 % (ref 11.5–15.5)
WBC: 11.6 10*3/uL — ABNORMAL HIGH (ref 4.0–10.5)
nRBC: 0 % (ref 0.0–0.2)

## 2024-03-09 LAB — BASIC METABOLIC PANEL WITH GFR
Anion gap: 7 (ref 5–15)
BUN: 15 mg/dL (ref 6–20)
CO2: 23 mmol/L (ref 22–32)
Calcium: 8.9 mg/dL (ref 8.9–10.3)
Chloride: 107 mmol/L (ref 98–111)
Creatinine, Ser: 0.63 mg/dL (ref 0.44–1.00)
GFR, Estimated: >60 mL/min (ref >60)
Glucose, Bld: 101 mg/dL — ABNORMAL HIGH (ref 70–99)
Potassium: 4 mmol/L (ref 3.5–5.1)
Sodium: 137 mmol/L (ref 135–145)

## 2024-03-09 LAB — TROPONIN I (HIGH SENSITIVITY)
Troponin I (High Sensitivity): <2 ng/L (ref <18)
Troponin I (High Sensitivity): 3 ng/L (ref <18)

## 2024-03-09 MED ORDER — ALPRAZOLAM 0.5 MG PO TABS: 0.5000 mg | ORAL_TABLET | Freq: Three times a day (TID) | ORAL | 0 refills | Status: AC | PRN

## 2024-03-09 MED ORDER — ALPRAZOLAM 0.5 MG PO TABS
0.5000 mg | ORAL_TABLET | Freq: Once | ORAL | Status: AC
  Administered 2024-03-09: 0.5 mg via ORAL
  Filled 2024-03-09: qty 1

## 2024-03-09 NOTE — Discharge Instructions (Addendum)
 Follow-up with your primary care provider.  Return to the ER for new, worsening, or persistent severe chest pain, vomiting, difficulty breathing, weakness or lightheadedness, or any other new or worsening symptoms that concern you.

## 2024-03-09 NOTE — ED Triage Notes (Signed)
 Patient c/o intermittent left sided chest pressure X2 weeks.  Reports dizziness and emesis

## 2024-03-09 NOTE — ED Provider Notes (Signed)
 [facility] Provider Note    Event Date/Time   First MD Initiated Contact with Patient 03/09/24 2039     (approximate)   History   Chest Pain   HPI  Dominique Dyer is a 43 y.o. female with a history of anemia, GERD, hypertension, migraine, and bipolar disorder who presents with an episode of chest pain this evening, lasting about 40 minutes.  The patient states that she had been feeling anxious.  She then threw up, and subsequently began to have the chest pain which is pressure-like and mainly on the left side.  She states that it has subsequently subsided.  She reports mild associated shortness of breath but no lightheadedness.  She states that she has had some intermittent chest pain over the last 1 to 2 weeks and she believes it is related to anxiety.  The patient states that also she ran out of her alprazolam last night and did not have it today.  She does not have her prescription refilled until Monday.  She thinks this may also have contributed to the current symptoms.  She denies any leg pain or swelling.  She is not on birth control.  She denies drug use.  I reviewed the past medical records.  The patient's most recent outpatient encounter was an evaluation by neurology on 6/17 for migraine and a occipital neuralgia.   Physical Exam   Triage Vital Signs: ED Triage Vitals  Encounter Vitals Group     BP 03/09/24 1853 124/84     Girls Systolic BP Percentile --      Girls Diastolic BP Percentile --      Boys Systolic BP Percentile --      Boys Diastolic BP Percentile --      Pulse Rate 03/09/24 1853 91     Resp 03/09/24 1853 18     Temp 03/09/24 1853 98 F (36.7 C)     Temp Source 03/09/24 1853 Oral     SpO2 03/09/24 1853 100 %     Weight 03/09/24 1854 217 lb (98.4 kg)     Height 03/09/24 1854 5' 4 (1.626 m)     Head Circumference --      Peak Flow --      Pain Score 03/09/24 1854 5     Pain Loc --      Pain Education --      Exclude from  Growth Chart --     Most recent vital signs: Vitals:   03/09/24 1853 03/09/24 2227  BP: 124/84 138/78  Pulse: 91   Resp: 18 15  Temp: 98 F (36.7 C)   SpO2: 100% 93%     General: Alert, well-appearing, no distress.  CV:  Good peripheral perfusion.  Normal heart sounds. Resp:  Normal effort.  Lungs CTAB. Abd:  No distention.  Other:  No peripheral edema.   ED Results / Procedures / Treatments   Labs (all labs ordered are listed, but only abnormal results are displayed) Labs Reviewed  BASIC METABOLIC PANEL WITH GFR - Abnormal; Notable for the following components:      Result Value   Glucose, Bld 101 (*)    All other components within normal limits  CBC - Abnormal; Notable for the following components:   WBC 11.6 (*)    RBC 5.40 (*)    Hemoglobin 11.7 (*)    MCV 70.7 (*)    MCH 21.7 (*)    All other components within normal limits  TROPONIN I (HIGH SENSITIVITY)  TROPONIN I (HIGH SENSITIVITY)     EKG  ED ECG REPORT I, Lind Repine, the attending physician, personally viewed and interpreted this ECG.  Date: 03/09/2024 EKG Time: 1856 Rate: 76 Rhythm: normal sinus rhythm QRS Axis: normal Intervals: normal ST/T Wave abnormalities: normal Narrative Interpretation: no evidence of acute ischemia    RADIOLOGY  Chest x-ray: I independently viewed and interpreted the images; there is no focal consolidation or edema  PROCEDURES:  Critical Care performed: No  Procedures   MEDICATIONS ORDERED IN ED: Medications  ALPRAZolam (XANAX) tablet 0.5 mg (0.5 mg Oral Given 03/09/24 2216)     IMPRESSION / MDM / ASSESSMENT AND PLAN / ED COURSE  I reviewed the triage vital signs and the nursing notes.  43 year old female with PMH as noted above presents with atypical chest pain intermittently over the last 2 weeks, with an acute episode this afternoon after becoming anxious and having an episode of vomiting.  The symptoms are now resolved.  On exam the patient  is well-appearing with normal vital signs.  Physical exam is unremarkable for acute findings.  EKG is nonischemic.  Chest x-ray shows no acute abnormality.  Differential diagnosis includes, but is not limited to, acute anxiety, musculoskeletal chest wall pain, GERD, less likely ACS.  The patient is PERC negative.  There is no clinical evidence for aortic dissection or other vascular cause.  BMP and CBC show no acute findings.  Initial troponin is negative.  We will obtain a repeat troponin and reassess.  Patient's presentation is most consistent with acute presentation with potential threat to life or bodily function.  The patient is on the cardiac monitor to evaluate for evidence of arrhythmia and/or significant heart rate changes.  ----------------------------------------- 10:22 PM on 03/09/2024 -----------------------------------------  Repeat troponin is negative.  The patient remains chest pain-free.  After checking her record and the PDMP, I have prescribed a small quantity of the patient's normal dose of alprazolam to cover her for the next 2 days until her prescription is refilled on Monday.  She is stable for discharge home at this time.  Return precautions given, and she expresses understanding.    FINAL CLINICAL IMPRESSION(S) / ED DIAGNOSES   Final diagnoses:  Atypical chest pain  Anxiety     Rx / DC Orders   ED Discharge Orders          Ordered    ALPRAZolam (XANAX) 0.5 MG tablet  3 times daily PRN        03/09/24 2221             Note:  This document was prepared using Dragon voice recognition software and may include unintentional dictation errors.    Lind Repine, MD 03/09/24 2324

## 2024-03-09 NOTE — ED Notes (Signed)
 Pt sts she was having left sided chest pain initially with SOB, n/v.  Pain has now moved to her left side ribcage and she reports a 6/10.  Pt sts that she has noticed that this chest pain comes with anxiety and she has run out of her xanax and cannot refill until Monday morning.  Pt also reports hx of afib during preganancy.

## 2024-03-21 ENCOUNTER — Ambulatory Visit: Admitting: Nurse Practitioner

## 2024-04-05 DIAGNOSIS — R4589 Other symptoms and signs involving emotional state: Secondary | ICD-10-CM | POA: Insufficient documentation

## 2024-05-06 DIAGNOSIS — Z789 Other specified health status: Secondary | ICD-10-CM | POA: Insufficient documentation

## 2024-05-06 DIAGNOSIS — G894 Chronic pain syndrome: Secondary | ICD-10-CM | POA: Insufficient documentation

## 2024-05-06 DIAGNOSIS — M899 Disorder of bone, unspecified: Secondary | ICD-10-CM | POA: Insufficient documentation

## 2024-05-06 DIAGNOSIS — Z79899 Other long term (current) drug therapy: Secondary | ICD-10-CM | POA: Insufficient documentation

## 2024-05-06 NOTE — Patient Instructions (Signed)

## 2024-05-06 NOTE — Progress Notes (Unsigned)
 PROVIDER NOTE: Interpretation of information contained herein should be left to medically-trained personnel. Specific patient instructions are provided elsewhere under Patient Instructions section of medical record. This document was created in part using AI and STT-dictation technology, any transcriptional errors that may result from this process are unintentional.  Patient: Dominique Dyer  Service: E/M Encounter  Provider: Eric Dominique Como, MD  DOB: 08/26/1981  Delivery: Face-to-face  Specialty: Interventional Pain Management  MRN: [id number]  Setting: Ambulatory outpatient facility  Specialty designation: 09  Type: New Patient  Location: Outpatient office facility  PCP: Dominique Dyer  DOS: 05/07/2024    Referring Prov.: Dominique Dyer*   Primary Reason(s) for Visit: Encounter for initial evaluation of one or more chronic problems (new to examiner) potentially causing chronic pain, and posing a threat to normal musculoskeletal function. (Level of risk: High) CC: No chief complaint on file.  HPI  [name] is a 43 y.o. year old, female patient, who comes for the first time to our practice referred by Dominique Dyer* for our initial evaluation of her chronic pain. She has H/O cesarean section complicating pregnancy; Marijuana abuse; History of pregnancy induced hypertension; Atypical squamous cell changes of undetermined significance (ASCUS) on cervical cytology with positive high risk human papilloma virus (HPV); Iron  deficiency anemia; B12 deficiency; Carpal tunnel syndrome during pregnancy; Anxiety; Bilateral occipital neuralgia; Bipolar 1 disorder (HCC); GERD (gastroesophageal reflux disease); History of cervical dysplasia; Hypertension; Insomnia; Intractable chronic migraine without aura and without status migrainosus; Low grade squamous intraepithelial lesion on cytologic smear of cervix (LGSIL); Marijuana use; Umbilical hernia without obstruction and  without gangrene; Anemia; Dry skin; Rib pain; Chronic pain syndrome; Pharmacologic therapy; Disorder of skeletal system; and Problems influencing health status on their problem list. Today she comes in for evaluation of her No chief complaint on file.  Pain Assessment: Location:     Radiating:   Onset:   Duration:   Quality:   Severity:  /10 (subjective, self-reported pain score)  Effect on ADL:   Timing:   Modifying factors:   BP:    HR:    Onset and Duration: {Hx; Onset and Duration:210120511} Cause of pain: {Hx; Cause:210120521} Severity: {Pain Severity:210120502} Timing: {Symptoms; Timing:210120501} Aggravating Factors: {Causes; Aggravating pain factors:210120507} Alleviating Factors: {Causes; Alleviating Factors:210120500} Associated Problems: {Hx; Associated problems:210120515} Quality of Pain: {Hx; Symptom quality or Descriptor:210120531} Previous Examinations or Tests: {Hx; Previous examinations or test:210120529} Previous Treatments: {Hx; Previous Treatment:210120503}  [name] is being evaluated for possible interventional pain management therapies for the treatment of her chronic pain.  Discussed the use of AI scribe software for clinical note transcription with the patient, who gave verbal consent to proceed.  History of Present Illness            [name] has been informed that this initial visit was an evaluation only.  On the follow up appointment I will go over the results, including ordered tests and available interventional therapies. At that time she will have the opportunity to decide whether to proceed with offered therapies or not. In the event that [name] prefers avoiding interventional options, this will conclude our involvement in the case.  Medication management recommendations may be provided upon request.  Patient informed that diagnostic tests may be ordered to assist in identifying underlying causes, narrow the list of differential  diagnoses and aid in determining candidacy for (or contraindications to) planned therapeutic interventions.  Historic Controlled Substance Pharmacotherapy Review PMP and historical list of controlled substances: ***  Most recently prescribed controlled substance(s): Opioid Analgesic: *** MME/day: *** mg/day  Historical Monitoring: The patient  reports current drug use. Drug: Marijuana. List of prior UDS Testing: Lab Results  Component Value Date   MDMA NONE DETECTED 04/14/2018   COCAINSCRNUR NONE DETECTED 04/14/2018   COCAINSCRNUR Negative 02/01/2018   COCAINSCRNUR NONE DETECTED 11/20/2011   PCPSCRNUR NONE DETECTED 04/14/2018   PCPQUANT Negative 02/01/2018   CANNABQUANT Positive (A) 02/01/2018   THCU POSITIVE (A) 04/14/2018   THCU POSITIVE (A) 11/20/2011   ETH <11 11/20/2011   Historical Background Evaluation: [place] PMP: PDMP reviewed during this encounter. Review of the past 9-months conducted.             PMP NARX Score Report:  Narcotic: 210 Sedative: 452 Stimulant: 000 [place] Department of public safety, offender search: Engineer, mining Information) Non-contributory Risk Assessment Profile: Aberrant behavior: None observed or detected today Risk factors for fatal opioid overdose: None identified today PMP NARX Overdose Risk Score: 400 Fatal overdose hazard ratio (HR): Calculation deferred Non-fatal overdose hazard ratio (HR): Calculation deferred Risk of opioid abuse or dependence: 0.7-3.0% with doses <= 36 MME/day and 6.1-26% with doses >= 120 MME/day. Substance use disorder (SUD) risk level: See below Personal History of Substance Abuse (SUD-Substance use disorder):  Alcohol:    Illegal Drugs:    Rx Drugs:    ORT Risk Level calculation:    ORT Scoring interpretation table:  Score <3 = Low Risk for SUD  Score between 4-7 = Moderate Risk for SUD  Score >8 = High Risk for Opioid Abuse   PHQ-2 Depression Scale:  Total score:    PHQ-2 Scoring interpretation table: (Score and  probability of major depressive disorder)  Score 0 = No depression  Score 1 = 15.4% Probability  Score 2 = 21.1% Probability  Score 3 = 38.4% Probability  Score 4 = 45.5% Probability  Score 5 = 56.4% Probability  Score 6 = 78.6% Probability   PHQ-9 Depression Scale:  Total score:    PHQ-9 Scoring interpretation table:  Score 0-4 = No depression  Score 5-9 = Mild depression  Score 10-14 = Moderate depression  Score 15-19 = Moderately severe depression  Score 20-27 = Severe depression (2.4 times higher risk of SUD and 2.89 times higher risk of overuse)   Pharmacologic Plan: As per protocol, I have not taken over any controlled substance management, pending the results of ordered tests and/or consults.            Initial impression: Pending review of available data and ordered tests.  Meds   Current Outpatient Medications:    ALPRAZolam  (XANAX ) 0.5 MG tablet, Take 0.5 mg by mouth 3 (three) times daily as needed., Disp: , Rfl:    cyclobenzaprine  (FLEXERIL ) 5 MG tablet, Take 1 tablet (5 mg total) by mouth 3 (three) times daily as needed for muscle spasms., Disp: 30 tablet, Rfl: 0   dicyclomine  (BENTYL ) 20 MG tablet, Take 1 tablet (20 mg total) by mouth every 6 (six) hours as needed for up to 7 days for spasms., Disp: 25 tablet, Rfl: 0   diphenhydrAMINE -PE-APAP 25-5-325 MG TABS, Take 2 tablets by mouth daily as needed (for congestion)., Disp: , Rfl:    lamoTRIgine (LAMICTAL) 200 MG tablet, Take 400 mg by mouth daily., Disp: , Rfl:    pantoprazole  (PROTONIX ) 40 MG tablet, Take 1 tablet (40 mg total) by mouth daily., Disp: 90 tablet, Rfl: 1   REXULTI 3 MG TABS, Take 1 tablet by mouth daily., Disp: ,  Rfl:    tobramycin  (TOBREX ) 0.3 % ophthalmic solution, Place 2 drops into the left eye every 4 (four) hours., Disp: 5 mL, Rfl: 0  Imaging Review  Cervical Imaging: Cervical CT wo contrast: Results for orders placed during the hospital encounter of 07/21/21 CT Cervical Spine Wo  Contrast  Narrative CLINICAL DATA:  Trauma  EXAM: CT HEAD WITHOUT CONTRAST  CT MAXILLOFACIAL WITHOUT CONTRAST  CT CERVICAL SPINE WITHOUT CONTRAST  TECHNIQUE: Multidetector CT imaging of the head, cervical spine, and maxillofacial structures were performed using the standard protocol without intravenous contrast. Multiplanar CT image reconstructions of the cervical spine and maxillofacial structures were also generated.  COMPARISON:  None.  FINDINGS: CT HEAD FINDINGS  Brain: No evidence of acute infarction, hemorrhage, hydrocephalus, extra-axial collection or mass lesion/mass effect.  Vascular: No hyperdense vessel or unexpected calcification.  CT FACIAL BONES FINDINGS  Skull: Normal. Negative for fracture or focal lesion.  Facial bones: No displaced fractures or dislocations.  Sinuses/Orbits: Mucosal thickening and a small air-fluid level of the left maxillary sinus (series 3, image 34).  Other: None.  CT CERVICAL SPINE FINDINGS  Alignment: Normal.  Skull base and vertebrae: No acute fracture. No primary bone lesion or focal pathologic process.  Soft tissues and spinal canal: No prevertebral fluid or swelling. No visible canal hematoma.  Disc levels:  Intact.  Upper chest: Negative.  Other: Numerous prominent anterior and posterior cervical lymph nodes. 22 14:19  IMPRESSION: 1. No acute intracranial pathology. 2. No displaced fractures or dislocations of the facial bones. 3. Mucosal thickening and a small air-fluid level of the left maxillary sinus without associated fracture of the sinus or orbit. Correlate for sinusitis. 4. No fracture or static subluxation of the cervical spine. 5. Numerous prominent anterior and posterior cervical lymph nodes, nonspecific and likely reactive.   Electronically Signed By: Marolyn JONETTA Jaksch M.D. On: 07/22/19  Knee Imaging: Knee-L DG 4 views: Results for orders placed during the hospital encounter of 12/30/15 DG  Knee Complete 4 Views Left  Narrative CLINICAL DATA:  Pain for 1 month. Anterior pain radiating down the leg.  EXAM: LEFT KNEE - COMPLETE 4+ VIEW  COMPARISON:  None.  FINDINGS: There is no evidence of fracture, dislocation, or joint effusion. There is no evidence of arthropathy or other focal bone abnormality. Soft tissues are unremarkable.  IMPRESSION: Negative.   Electronically Signed By: Julaine Blanch On: 12/30/2015 14:43  Hand Imaging: Hand-L DG Complete: Results for orders placed during the hospital encounter of 09/27/07 DG Hand Complete Left  Narrative Clinical Data: Left fifth finger injury. LEFT HAND - 3 VIEW: Findings: Soft tissue swelling laterally along the fifth metacarpal. No fracture or radiopaque foreign body.  Impression No acute bony finding.  Provider: Augustin Moats  Complexity Note: Imaging results reviewed.                         ROS  Cardiovascular: {Hx; Cardiovascular History:210120525} Pulmonary or Respiratory: {Hx; Pumonary and/or Respiratory History:210120523} Neurological: {Hx; Neurological:210120504} Psychological-Psychiatric: {Hx; Psychological-Psychiatric History:210120512} Gastrointestinal: {Hx; Gastrointestinal:210120527} Genitourinary: {Hx; Genitourinary:210120506} Hematological: {Hx; Hematological:210120510} Endocrine: {Hx; Endocrine history:210120509} Rheumatologic: {Hx; Rheumatological:210120530} Musculoskeletal: {Hx; Musculoskeletal:210120528} Work History: {Hx; Work history:210120514}  Allergies  Ms. Stotler is allergic to aspirin , naproxen , sumatriptan succinate, and lactose intolerance (gi).  Laboratory Chemistry Profile   Renal Lab Results  Component Value Date   BUN 15 03/09/2024   CREATININE 0.63 03/09/2024   BCR 12 04/28/2022   GFRAA >60 04/13/2018   GFRNONAA >60 03/09/2024  SPECGRAV 1.015 04/11/2018   PHUR 6.0 04/11/2018   PROTEINUR NEGATIVE 01/19/2024     Electrolytes Lab Results  Component Value Date    NA 137 03/09/2024   K 4.0 03/09/2024   CL 107 03/09/2024   CALCIUM 8.9 03/09/2024     Hepatic Lab Results  Component Value Date   AST 20 01/19/2024   ALT 20 01/19/2024   ALBUMIN 4.0 01/19/2024   ALKPHOS 54 01/19/2024   LIPASE 41 01/19/2024     ID Lab Results  Component Value Date   HIV Non Reactive 10/12/2017   PREGTESTUR Negative 01/19/2024     Bone No results found for: VD25OH, CI874NY7UNU, CI6874NY7, CI7874NY7, 25OHVITD1, 25OHVITD2, 25OHVITD3, TESTOFREE, TESTOSTERONE   Endocrine Lab Results  Component Value Date   GLUCOSE 101 (H) 03/09/2024   GLUCOSEU NEGATIVE 01/19/2024   HGBA1C 5.7 (H) 04/28/2022   TSH 1.200 04/28/2022   FREET4 1.26 04/28/2022     Neuropathy Lab Results  Component Value Date   VITAMINB12 423 04/28/2022   FOLATE 9.2 06/02/2020   HGBA1C 5.7 (H) 04/28/2022   HIV Non Reactive 10/12/2017     CNS No results found for: COLORCSF, APPEARCSF, RBCCOUNTCSF, WBCCSF, POLYSCSF, LYMPHSCSF, EOSCSF, PROTEINCSF, GLUCCSF, JCVIRUS, CSFOLI, IGGCSF, LABACHR, ACETBL   Inflammation (CRP: Acute  ESR: Chronic) Lab Results  Component Value Date   ESRSEDRATE 13 10/08/2019     Rheumatology No results found for: RF, ANA, LABURIC, URICUR, LYMEIGGIGMAB, LYMEABIGMQN, HLAB27   Coagulation Lab Results  Component Value Date   PLT 360 03/09/2024     Cardiovascular Lab Results  Component Value Date   CKTOTAL 128 02/23/2014   CKMB 1.0 02/23/2014   TROPONINI <0.03 03/08/2018   HGB 11.7 (L) 03/09/2024   HCT 38.2 03/09/2024     Screening Lab Results  Component Value Date   HIV Non Reactive 10/12/2017   PREGTESTUR Negative 01/19/2024     Cancer No results found for: CEA, CA125, LABCA2   Allergens No results found for: ALMOND, APPLE, ASPARAGUS, AVOCADO, BANANA, BARLEY, BASIL, BAYLEAF, GREENBEAN, LIMABEAN, WHITEBEAN, BEEFIGE, REDBEET, BLUEBERRY, BROCCOLI,  CABBAGE, MELON, CARROT, CASEIN, CASHEWNUT, CAULIFLOWER, CELERY     Note: Lab results reviewed.  PFSH  Drug: Ms. Egolf  reports current drug use. Drug: Marijuana. Alcohol:  reports no history of alcohol use. Tobacco:  reports that she has been smoking cigars. She has never used smokeless tobacco. Medical:  has a past medical history of Allergy, Anemia (2019), Anginal pain (HCC) (2019), Anxiety, Arthritis, Bipolar disorder (HCC), Depression, Dyspnea, Dysrhythmia (2019), GERD (gastroesophageal reflux disease), Herniated lumbar intervertebral disc, Hypertension (2019), Migraines, Panic attacks, and Pica (2019). Family: family history includes Anxiety disorder in her maternal grandmother and mother; Autism in her son; Bipolar disorder in her maternal grandmother; Cancer in her maternal grandfather and paternal uncle; Diabetes in her mother; Diabetic kidney disease in her paternal uncle; Heart attack in her paternal grandfather; Heart attack (age of onset: 60) in her mother; OCD in her maternal grandmother; Schizophrenia in her maternal grandmother; Seizures in her father; Thyroid  disease in her paternal grandmother.  Past Surgical History:  Procedure Laterality Date   CESAREAN SECTION     CESAREAN SECTION Bilateral 04/14/2018   Procedure: REPEAT CESAREAN SECTION WITH BTL;  Surgeon: Janit Alm Agent, MD;  Location: [facility];  Service: Obstetrics;  Laterality: Bilateral;  Birth: 10:[birth year] Sex: Female Weight: 8 lbs 3 oz    COLPOSCOPY  2006   TUBAL LIGATION  2019   Active Ambulatory Problems    Diagnosis Date Noted  H/O cesarean section complicating pregnancy 11/09/2017   Marijuana abuse 11/09/2017   History of pregnancy induced hypertension 11/09/2017   Atypical squamous cell changes of undetermined significance (ASCUS) on cervical cytology with positive high risk human papilloma virus (HPV) 11/09/2017   Iron  deficiency anemia 11/16/2017   B12 deficiency 02/01/2018   Carpal tunnel  syndrome during pregnancy 03/28/2018   Anxiety 05/19/2020   Bilateral occipital neuralgia 04/19/2019   Bipolar 1 disorder (HCC) 05/19/2020   GERD (gastroesophageal reflux disease) 05/19/2020   History of cervical dysplasia 06/21/2012   Hypertension 05/19/2020   Insomnia 05/05/2020   Intractable chronic migraine without aura and without status migrainosus 01/21/2017   Low grade squamous intraepithelial lesion on cytologic smear of cervix (LGSIL) 05/21/2015   Marijuana use 03/13/2015   Umbilical hernia without obstruction and without gangrene 04/28/2022   Anemia 04/28/2022   Dry skin 04/28/2022   Rib pain 12/11/2022   Chronic pain syndrome 05/06/2024   Pharmacologic therapy 05/06/2024   Disorder of skeletal system 05/06/2024   Problems influencing health status 05/06/2024   Resolved Ambulatory Problems    Diagnosis Date Noted   History of anemia 11/09/2017   Anemia of pregnancy in third trimester 11/09/2017   Advanced maternal age in multigravida 11/09/2017   Low vitamin B12 level 12/28/2017   Goals of care, counseling/discussion 12/28/2017   Leucocytosis 02/08/2018   Obesity in pregnancy 03/01/2018   Sinus tachycardia 03/08/2018   Elevated blood pressure affecting pregnancy in third trimester, antepartum 03/08/2018   Atypical chest pain 03/08/2018   Shortness of breath 03/08/2018   Delivery by cesarean section using transverse incision of lower segment of uterus 04/14/2018   Abnormal Pap smear of cervix 05/19/2020   Panic attacks 05/19/2020   Pregnancy 02/24/2015   Recurrent headache 02/01/2016   Past Medical History:  Diagnosis Date   Allergy    Anginal pain (HCC) 2019   Arthritis    Bipolar disorder (HCC)    Depression    Dyspnea    Dysrhythmia 2019   Herniated lumbar intervertebral disc    Migraines    Pica 2019   Constitutional Exam  General appearance: Well nourished, well developed, and well hydrated. In no apparent acute distress There were no vitals filed  for this visit. BMI Assessment: Estimated body mass index is 37.25 kg/m as calculated from the following:   Height as of 03/09/24: 5' 4 (1.626 m).   Weight as of 03/09/24: 217 lb (98.4 kg).  BMI interpretation table: BMI level Category Range association with higher incidence of chronic pain  <18 kg/m2 Underweight   18.5-24.9 kg/m2 Ideal body weight   25-29.9 kg/m2 Overweight Increased incidence by 20%  30-34.9 kg/m2 Obese (Class I) Increased incidence by 68%  35-39.9 kg/m2 Severe obesity (Class II) Increased incidence by 136%  >40 kg/m2 Extreme obesity (Class III) Increased incidence by 254%   Patient's current BMI Ideal Body weight  There is no height or weight on file to calculate BMI. Patient weight not recorded   BMI Readings from Last 4 Encounters:  03/09/24 37.25 kg/m  10/29/23 35.70 kg/m  05/20/23 33.68 kg/m  12/11/22 33.81 kg/m   Wt Readings from Last 4 Encounters:  03/09/24 217 lb (98.4 kg)  10/29/23 208 lb (94.3 kg)  05/20/23 196 lb 3.4 oz (89 kg)  12/11/22 197 lb (89.4 kg)    Psych/Mental status: Alert, oriented x 3 (person, place, & time)       Eyes: PERLA Respiratory: No evidence of acute respiratory distress  Assessment  Primary Diagnosis & Pertinent Problem List: The primary encounter diagnosis was Chronic pain syndrome. Diagnoses of Pharmacologic therapy, Disorder of skeletal system, and Problems influencing health status were also pertinent to this visit.  Visit Diagnosis (New problems to examiner): 1. Chronic pain syndrome   2. Pharmacologic therapy   3. Disorder of skeletal system   4. Problems influencing health status    Plan of Care (Initial workup plan)  Note: Ms. Pontillo was reminded that as per protocol, today's visit has been an evaluation only. We have not taken over the patient's controlled substance management.  Problem-specific plan: Assessment and Plan            Lab Orders  No laboratory test(s) ordered today   Imaging  Orders  No imaging studies ordered today   Referral Orders  No referral(s) requested today   Procedure Orders    No procedure(s) ordered today   Pharmacotherapy (current): Medications ordered:  No orders of the defined types were placed in this encounter.  Medications administered during this visit: Dominique DOROTHA Katie had no medications administered during this visit.   Analgesic Pharmacotherapy:  Opioid Analgesics: For patients currently taking or requesting to take opioid analgesics, in accordance with [place]  Medical Board Guidelines, we will assess their risks and indications for the use of these substances. After completing our evaluation, we may offer recommendations, but we no longer take patients for medication management. The prescribing physician will ultimately decide, based on his/her training and level of comfort whether to adopt any of the recommendations, including whether or not to prescribe such medicines.  Membrane stabilizer: To be determined at a later time  Muscle relaxant: To be determined at a later time  NSAID: To be determined at a later time  Other analgesic(s): To be determined at a later time   Interventional management options: Ms. Rini was informed that there is no guarantee that she would be a candidate for interventional therapies. The decision will be based on the results of diagnostic studies, as well as Ms. Badie's risk profile.  Procedure(s) under consideration:  Pending results of ordered studies     Interventional Therapies  Risk Factors  Considerations  Medical Comorbidities:     Planned  Pending:      Under consideration:   Pending   Completed: (Analgesic benefit)1  None at this time   Therapeutic  Palliative (PRN) options:   None established   Completed by other providers:   None reported  1(Analgesic benefit): Expressed in percentage (%). (Local anesthetic[LA] +/- sedation  L.A.Local Anesthetic  Steroid benefit   Ongoing benefit)   Provider-requested follow-up: No follow-ups on file.  Future Appointments  Date Time Provider Department Center  05/07/2024  2:00 PM Tanya Glisson, MD [facility] None   I discussed the assessment and treatment plan with the patient. The patient was provided an opportunity to ask questions and all were answered. The patient agreed with the plan and demonstrated an understanding of the instructions.  Patient advised to call back or seek an in-person evaluation if the symptoms or condition worsens.  Duration of encounter: *** minutes.  Total time on encounter, as per AMA guidelines included both the face-to-face and non-face-to-face time personally spent by the physician and/or other qualified health care professional(s) on the day of the encounter (includes time in activities that require the physician or other qualified health care professional and does not include time in activities normally performed by clinical staff). Physician's time may include the following activities  when performed: Preparing to see the patient (e.g., pre-charting review of records, searching for previously ordered imaging, lab work, and nerve conduction tests) Review of prior analgesic pharmacotherapies. Reviewing PMP Interpreting ordered tests (e.g., lab work, imaging, nerve conduction tests) Performing post-procedure evaluations, including interpretation of diagnostic procedures Obtaining and/or reviewing separately obtained history Performing a medically appropriate examination and/or evaluation Counseling and educating the patient/family/caregiver Ordering medications, tests, or procedures Referring and communicating with other health care professionals (when not separately reported) Documenting clinical information in the electronic or other health record Independently interpreting results (not separately reported) and communicating results to the patient/ family/caregiver Care  coordination (not separately reported)  Note by: Dominique Dominique Como, MD (TTS and AI technology used. I apologize for any typographical errors that were not detected and corrected.) Date: 05/07/2024; Time: 7:05 PM

## 2024-05-07 ENCOUNTER — Ambulatory Visit
Admission: RE | Admit: 2024-05-07 | Discharge: 2024-05-07 | Disposition: A | Discharge status: Home / Self Care | Source: Ambulatory Visit | Attending: Pain Medicine | Admitting: Pain Medicine

## 2024-05-07 ENCOUNTER — Encounter: Payer: Self-pay | Admitting: Pain Medicine

## 2024-05-07 ENCOUNTER — Ambulatory Visit: Admitting: Pain Medicine

## 2024-05-07 VITALS — BP 131/82 | HR 92 | Temp 97.7°F | Resp 16 | Ht 64.0 in | Wt 204.8 lb

## 2024-05-07 DIAGNOSIS — M25552 Pain in left hip: Secondary | ICD-10-CM | POA: Insufficient documentation

## 2024-05-07 DIAGNOSIS — M533 Sacrococcygeal disorders, not elsewhere classified: Secondary | ICD-10-CM

## 2024-05-07 DIAGNOSIS — M542 Cervicalgia: Secondary | ICD-10-CM | POA: Insufficient documentation

## 2024-05-07 DIAGNOSIS — M545 Low back pain, unspecified: Secondary | ICD-10-CM

## 2024-05-07 DIAGNOSIS — M47816 Spondylosis without myelopathy or radiculopathy, lumbar region: Secondary | ICD-10-CM | POA: Insufficient documentation

## 2024-05-07 DIAGNOSIS — M2480 Other specific joint derangements of unspecified joint, not elsewhere classified: Secondary | ICD-10-CM

## 2024-05-07 DIAGNOSIS — G4486 Cervicogenic headache: Secondary | ICD-10-CM | POA: Insufficient documentation

## 2024-05-07 DIAGNOSIS — M5481 Occipital neuralgia: Secondary | ICD-10-CM

## 2024-05-07 DIAGNOSIS — M4316 Spondylolisthesis, lumbar region: Secondary | ICD-10-CM

## 2024-05-07 DIAGNOSIS — M47812 Spondylosis without myelopathy or radiculopathy, cervical region: Secondary | ICD-10-CM | POA: Insufficient documentation

## 2024-05-07 DIAGNOSIS — M5459 Other low back pain: Secondary | ICD-10-CM

## 2024-05-07 DIAGNOSIS — E538 Deficiency of other specified B group vitamins: Secondary | ICD-10-CM

## 2024-05-07 DIAGNOSIS — Z789 Other specified health status: Secondary | ICD-10-CM | POA: Insufficient documentation

## 2024-05-07 DIAGNOSIS — M79605 Pain in left leg: Secondary | ICD-10-CM

## 2024-05-07 DIAGNOSIS — R94130 Abnormal response to nerve stimulation, unspecified: Secondary | ICD-10-CM | POA: Insufficient documentation

## 2024-05-07 DIAGNOSIS — Z79899 Other long term (current) drug therapy: Secondary | ICD-10-CM

## 2024-05-07 DIAGNOSIS — M899 Disorder of bone, unspecified: Secondary | ICD-10-CM

## 2024-05-07 DIAGNOSIS — G8929 Other chronic pain: Secondary | ICD-10-CM | POA: Insufficient documentation

## 2024-05-07 DIAGNOSIS — G894 Chronic pain syndrome: Secondary | ICD-10-CM

## 2024-05-07 NOTE — Progress Notes (Signed)
 Safety precautions to be maintained throughout the outpatient stay will include: orient to surroundings, keep bed in low position, maintain call bell within reach at all times, provide assistance with transfer out of bed and ambulation.

## 2024-05-09 LAB — COMPLIANCE DRUG ANALYSIS, UR

## 2024-05-14 LAB — COMP. METABOLIC PANEL (12)
AST: 39 IU/L (ref 0–40)
Albumin: 4.4 g/dL (ref 3.9–4.9)
Alkaline Phosphatase: 72 IU/L (ref 44–121)
BUN/Creatinine Ratio: 16 (ref 9–23)
BUN: 14 mg/dL (ref 6–24)
Bilirubin Total: <0.2 mg/dL (ref 0.0–1.2)
Calcium: 9.7 mg/dL (ref 8.7–10.2)
Chloride: 103 mmol/L (ref 96–106)
Creatinine, Ser: 0.89 mg/dL (ref 0.57–1.00)
Globulin, Total: 2.6 g/dL (ref 1.5–4.5)
Glucose: 91 mg/dL (ref 70–99)
Potassium: 4.6 mmol/L (ref 3.5–5.2)
Sodium: 140 mmol/L (ref 134–144)
Total Protein: 7 g/dL (ref 6.0–8.5)
eGFR: 83 mL/min/1.73 (ref >59)

## 2024-05-14 LAB — 25-HYDROXY VITAMIN D LCMS D2+D3
25-Hydroxy, Vitamin D-2: 36 ng/mL
25-Hydroxy, Vitamin D-3: 5.3 ng/mL
25-Hydroxy, Vitamin D: 41 ng/mL

## 2024-05-14 LAB — VITAMIN B12: Vitamin B-12: 504 pg/mL (ref 232–1245)

## 2024-05-14 LAB — C-REACTIVE PROTEIN: CRP: 15 mg/L — ABNORMAL HIGH (ref 0–10)

## 2024-05-14 LAB — MAGNESIUM: Magnesium: 2.1 mg/dL (ref 1.6–2.3)

## 2024-05-14 LAB — SEDIMENTATION RATE: Sed Rate: 25 mm/h (ref 0–32)

## 2024-05-29 DIAGNOSIS — R892 Abnormal level of other drugs, medicaments and biological substances in specimens from other organs, systems and tissues: Secondary | ICD-10-CM | POA: Insufficient documentation

## 2024-05-29 NOTE — Progress Notes (Unsigned)
 PROVIDER NOTE: Interpretation of information contained herein should be left to medically-trained personnel. Specific patient instructions are provided elsewhere under Patient Instructions section of medical record. This document was created in part using AI and STT-dictation technology, any transcriptional errors that may result from this process are unintentional.  Patient: Dominique Dyer  Service: E/M   PCP: [facility]  DOB: 18-Aug-1981  DOS: 05/30/2024  Provider: Eric DELENA Como, MD  MRN: [id number]  Delivery: Face-to-face  Specialty: Interventional Pain Management  Type: Established Patient  Setting: Ambulatory outpatient facility  Specialty designation: 09  Referring Prov.: [facility] *  Location: Outpatient office facility       Primary Reason(s) for Visit: Encounter for evaluation before starting new chronic pain management plan of care (Level of risk: moderate) CC: No chief complaint on file.  HPI  Dominique Dyer is a 43 y.o. year old, female patient, who comes today for a follow-up evaluation to review the test results and decide on a treatment plan. 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); Bipolar II disorder (HCC); GERD (gastroesophageal reflux disease); History of cervical dysplasia; Hypertension; Insomnia; Intractable chronic migraine without aura and without status migrainosus; Low grade squamous intraepithelial lesion on cytologic smear of cervix (LGSIL); Marijuana use; Umbilical hernia without obstruction and without gangrene; Anemia; Dry skin; Rib pain; Chronic pain syndrome; Pharmacologic therapy;  Disorder of skeletal system; Problems influencing health status; Myofascial pain; Prediabetes; Lumbar radiculopathy; Spondylolisthesis of lumbar region; Lumbar spondylosis; Myofascial pain syndrome of lumbar spine; Tobacco dependence; Anemia, postpartum; Anxiety about treatment; Chronic low back pain (1ry area of Pain) (Bilateral) (L>R) w/o sciatica; Chronic lower extremity pain (2ry area of Pain) (Left); Chronic hip pain (Left); Chronic sacroiliac joint pain (Left); Lumbar facet joint pain (Bilateral); Lumbar facet joint syndrome; Abnormal NCS (nerve conduction studies) (UE); Chronic neck pain; Cervical facet joint pain (Bilateral) (L>R); Cervical spine crepitus; Cervicogenic headache (Bilateral) (L>R); Spondylosis without myelopathy or radiculopathy, cervical region; and Spondylosis without myelopathy or radiculopathy, lumbar region on their problem list. Her primarily concern today is the No chief complaint on file.  Pain Assessment: Location:     Radiating:   Onset:   Duration:   Quality:   Severity:  /10 (subjective, self-reported pain score)  Effect on ADL:   Timing:   Modifying factors:   BP:    HR:    Dominique Dyer comes in today for a follow-up visit after her initial evaluation on 05/07/2024. Today we went over the results of her tests. These were explained in Layman's terms. During today's appointment we went over my diagnostic impression, as well as the proposed treatment plan.  Review of initial evaluation (05/07/2024): Dominique SOBOTTA is a 43 year old female who presents for pain management consultation. She was referred by her neurologist for evaluation of her chronic pain management.   She experiences chronic low back pain that persists despite various treatments. Epidural injections provided temporary relief for about six weeks before the pain returned to its original intensity. She seeks alternative pain management options to improve her quality of life. 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 Chiropractic care was attempted until her insurance coverage ended.  Review of diagnostic test ordered on 05/07/2024:  Diagnostic lab work: *** Diagnostic imaging: ***  Discussed the use of AI scribe software for clinical note transcription with the patient, who gave verbal consent to proceed.  History of Present Illness          Patient presented with interventional treatment options. Dominique Dyer was informed that I will not be providing medication management. Pharmacotherapy evaluation including recommendations may be offered, if specifically requested.   Controlled Substance Pharmacotherapy Assessment REMS (Risk Evaluation and Mitigation Strategy)  Opioid Analgesic: None MME/day: 0 mg/day   Pill Count: None expected due to no prior prescriptions written by our practice. No notes on file  Pharmacokinetics: Liberation and absorption (onset of action): WNL Distribution (time to peak effect): WNL Metabolism and excretion (duration of action): WNL         Pharmacodynamics: Desired effects: Analgesia: Dominique Dyer reports >50% benefit. Functional ability: Patient reports that medication allows her to accomplish basic ADLs Clinically meaningful improvement in function (CMIF): Sustained CMIF goals met Perceived effectiveness: Described as relatively effective, allowing for increase in activities of daily living (ADL) Undesirable effects: Side-effects or Adverse reactions: None reported Monitoring: [place] PMP: PDMP reviewed during this encounter. Online review of the past 54-month period previously conducted. Not applicable at this point since we have not taken over the patient's medication management yet. List of other Serum/Urine Drug Screening Test(s):  Lab Results  Component Value Date    COCAINSCRNUR NONE DETECTED 04/14/2018   COCAINSCRNUR Negative 02/01/2018   COCAINSCRNUR NONE DETECTED 11/20/2011   THCU POSITIVE (A) 04/14/2018   THCU POSITIVE (A) 11/20/2011   CANNABQUANT Positive (A) 02/01/2018   ETH <11 11/20/2011   List of all UDS test(s) done:  Lab Results  Component Value Date   SUMMARY FINAL 05/07/2024   Last UDS on record: Summary  Date Value Ref Range Status  05/07/2024 FINAL  Final    Comment:    ==================================================================== Compliance Drug Analysis, Ur ==================================================================== Test                             Result       Flag       Units  Drug Present and Declared for Prescription Verification   Cyclobenzaprine                 PRESENT      EXPECTED   Desmethylcyclobenzaprine       PRESENT      EXPECTED    Desmethylcyclobenzaprine is an expected metabolite of    cyclobenzaprine .    Diclofenac                     PRESENT      EXPECTED  Drug Present not Declared for Prescription Verification   Alprazolam                      166          UNEXPECTED ng/mg creat   Alpha-hydroxyalprazolam        389          UNEXPECTED ng/mg creat    Source of alprazolam  is a scheduled prescription medication. Alpha-    hydroxyalprazolam is an expected metabolite of alprazolam .    Carboxy-THC                    >  1000        UNEXPECTED ng/mg creat    Carboxy-THC is a metabolite of tetrahydrocannabinol (THC). Source of    THC is most commonly herbal marijuana or marijuana-based products,    but THC is also present in a scheduled prescription medication.    Trace amounts of THC can be present in hemp and cannabidiol (CBD)    products. This test is not intended to distinguish between delta-9-    tetrahydrocannabinol, the predominant form of THC in most herbal or    marijuana-based products, and delta-8-tetrahydrocannabinol.    Acetaminophen                   PRESENT       UNEXPECTED ==================================================================== Test                      Result    Flag   Units      Ref Range   Creatinine              100              mg/dL      >=79 ==================================================================== Declared Medications:  The flagging and interpretation on this report are based on the  following declared medications.  Unexpected results may arise from  inaccuracies in the declared medications.   **Note: The testing scope of this panel includes these medications:   Cyclobenzaprine  (Flexeril )   **Note: The testing scope of this panel does not include small to  moderate amounts of these reported medications:   Diclofenac (Voltaren)   **Note: The testing scope of this panel does not include the  following reported medications:   Pantoprazole  (Protonix )  Rizatriptan (Maxalt) ==================================================================== For clinical consultation, please call [phone]. ====================================================================    UDS interpretation: No unexpected findings.          Medication Assessment Form: Not applicable. No opioids. Treatment compliance: Not applicable Risk Assessment Profile: Aberrant behavior: See initial evaluations. None observed or detected today Comorbid factors increasing risk of overdose: See initial evaluation. No additional risks detected today Opioid risk tool (ORT):     05/07/2024    2:26 PM  Opioid Risk   Alcohol 1  Illegal Drugs 0  Rx Drugs 0  Alcohol 0  Illegal Drugs 0 (inaccurate, see 05/07/2024 UDS)  Rx Drugs 0  Age between 16-45 years  1  Psychological Disease 2  Bipolar Positive  Opioid Risk Tool Scoring 4  Opioid Risk Interpretation Moderate Risk    ORT Scoring interpretation table:  Score <3 = Low Risk for SUD  Score between 4-7 = Moderate Risk for SUD  Score >8 = High Risk for Opioid Abuse   Risk of substance use  disorder (SUD): Moderate-to-High  Risk Mitigation Strategies:  Patient opioid safety counseling: No controlled substances prescribed. Patient-Prescriber Agreement (PPA): No agreement signed.  Controlled substance notification to other providers: None required. No opioid therapy.  Pharmacologic Plan: Non-opioid analgesic therapy offered. Interventional alternatives discussed.             Laboratory Chemistry Profile   Renal Lab Results  Component Value Date   BUN 14 05/07/2024   CREATININE 0.89 05/07/2024   BCR 16 05/07/2024   GFRAA >60 04/13/2018   GFRNONAA >60 03/09/2024   SPECGRAV 1.015 04/11/2018   PHUR 6.0 04/11/2018   PROTEINUR NEGATIVE 01/19/2024     Electrolytes Lab Results  Component Value Date   NA 140 05/07/2024   K 4.6 05/07/2024  CL 103 05/07/2024   CALCIUM 9.7 05/07/2024   MG 2.1 05/07/2024     Hepatic Lab Results  Component Value Date   AST 39 05/07/2024   ALT 20 01/19/2024   ALBUMIN 4.4 05/07/2024   ALKPHOS 72 05/07/2024   LIPASE 41 01/19/2024     ID Lab Results  Component Value Date   HIV Non Reactive 10/12/2017   PREGTESTUR Negative 01/19/2024     Bone Lab Results  Component Value Date   25OHVITD1 41 05/07/2024   25OHVITD2 36 05/07/2024   25OHVITD3 5.3 05/07/2024     Endocrine Lab Results  Component Value Date   GLUCOSE 91 05/07/2024   GLUCOSEU NEGATIVE 01/19/2024   HGBA1C 5.7 (H) 04/28/2022   TSH 1.200 04/28/2022   FREET4 1.26 04/28/2022     Neuropathy Lab Results  Component Value Date   VITAMINB12 504 05/07/2024   FOLATE 9.2 06/02/2020   HGBA1C 5.7 (H) 04/28/2022   HIV Non Reactive 10/12/2017     CNS No results found for: COLORCSF, APPEARCSF, RBCCOUNTCSF, WBCCSF, POLYSCSF, LYMPHSCSF, EOSCSF, PROTEINCSF, GLUCCSF, JCVIRUS, CSFOLI, IGGCSF, LABACHR, ACETBL   Inflammation (CRP: Acute  ESR: Chronic) Lab Results  Component Value Date   CRP 15 (H) 05/07/2024   ESRSEDRATE 25 05/07/2024      Rheumatology No results found for: RF, ANA, LABURIC, URICUR, LYMEIGGIGMAB, LYMEABIGMQN, HLAB27   Coagulation Lab Results  Component Value Date   PLT 360 03/09/2024     Cardiovascular Lab Results  Component Value Date   CKTOTAL 128 02/23/2014   CKMB 1.0 02/23/2014   TROPONINI <0.03 03/08/2018   HGB 11.7 (L) 03/09/2024   HCT 38.2 03/09/2024     Screening Lab Results  Component Value Date   HIV Non Reactive 10/12/2017   PREGTESTUR Negative 01/19/2024     Cancer No results found for: CEA, CA125, LABCA2   Allergens No results found for: ALMOND, APPLE, ASPARAGUS, AVOCADO, BANANA, BARLEY, BASIL, BAYLEAF, GREENBEAN, LIMABEAN, WHITEBEAN, BEEFIGE, REDBEET, BLUEBERRY, BROCCOLI, CABBAGE, MELON, CARROT, CASEIN, CASHEWNUT, CAULIFLOWER, CELERY     Note: Lab results reviewed.  Recent Diagnostic Imaging Review  Cervical Imaging: Cervical CT wo contrast: Results for orders placed during the hospital encounter of 07/21/21 CT Cervical Spine Wo Contrast  Narrative CLINICAL DATA:  Trauma  EXAM: CT HEAD WITHOUT CONTRAST  CT MAXILLOFACIAL WITHOUT CONTRAST  CT CERVICAL SPINE WITHOUT CONTRAST  TECHNIQUE: Multidetector CT imaging of the head, cervical spine, and maxillofacial structures were performed using the standard protocol without intravenous contrast. Multiplanar CT image reconstructions of the cervical spine and maxillofacial structures were also generated.  COMPARISON:  None.  FINDINGS: CT HEAD FINDINGS  Brain: No evidence of acute infarction, hemorrhage, hydrocephalus, extra-axial collection or mass lesion/mass effect.  Vascular: No hyperdense vessel or unexpected calcification.  CT FACIAL BONES FINDINGS  Skull: Normal. Negative for fracture or focal lesion.  Facial bones: No displaced fractures or dislocations.  Sinuses/Orbits: Mucosal thickening and a small air-fluid level of the left maxillary  sinus (series 3, image 34).  Other: None.  CT CERVICAL SPINE FINDINGS  Alignment: Normal.  Skull base and vertebrae: No acute fracture. No primary bone lesion or focal pathologic process.  Soft tissues and spinal canal: No prevertebral fluid or swelling. No visible canal hematoma.  Disc levels:  Intact.  Upper chest: Negative.  Other: Numerous prominent anterior and posterior cervical lymph nodes.  IMPRESSION: 1. No acute intracranial pathology. 2. No displaced fractures or dislocations of the facial bones. 3. Mucosal thickening and a small air-fluid level of  the left maxillary sinus without associated fracture of the sinus or orbit. Correlate for sinusitis. 4. No fracture or static subluxation of the cervical spine. 5. Numerous prominent anterior and posterior cervical lymph nodes, nonspecific and likely reactive.   Electronically Signed By: Marolyn JONETTA Jaksch M.D. On: 07/21/2021 14:19  Cervical DG Bending/F/E views: Results for orders placed during the hospital encounter of 05/07/24 DG Cervical Spine With Flex & Extend  Narrative EXAM: 7 Views including Flexion and Extension XRAY OF THE CERVICAL SPINE 05/07/2024 04:32:00 PM  COMPARISON: None available.  CLINICAL HISTORY: Cervicalgia, chronic neck pain. Pt reports increasing pain in neck, lower back, hip and joints.  FINDINGS:  BONES: No acute fracture. No aggressive appearing osseous lesion. Straightening of normal lordosis.  DISCS AND DEGENERATIVE CHANGES: Mild disc space narrowing and spurring at C6-C7. Anterior spurring at additional levels with preservation of disc spaces. No abnormal motion or evidence of instability. No bony neural foraminal narrowing. Mild facet hypertrophy at C2-C3 on the left.  SOFT TISSUES: No prevertebral soft tissue swelling. The visualized lungs appear clear.  IMPRESSION: 1. Mild degenerative disc disease greatest at C6-C7 2. Mild facet hypertrophy at C2-C3 on the  left.  Electronically signed by: Andrea Gasman MD 05/19/2024 09:28 PM EDT RP Workstation: [id number]  Lumbosacral Imaging: Lumbar DG Bending views: Results for orders placed during the hospital encounter of 05/07/24 DG Lumbar Spine Complete W/Bend  Narrative EXAM: 6 or more VIEW(S) XRAY OF THE LUMBAR SPINE 05/07/2024 04:32:00 PM  COMPARISON: None available.  CLINICAL HISTORY: Low back pain. Pt reports increasing pain in neck, lower back, hip and joints.  FINDINGS:  LUMBAR SPINE:  BONES: No acute fracture. No aggressive appearing osseous lesion. 5 non-rib-bearing lumbar vertebrae. Vertebral body heights are normal. No visible pars defects.  DISCS AND DEGENERATIVE CHANGES: Disk space narrowing and spurring at L4-L5 and L5-S1. Moderate L4-L5 with mild L5-S1 facet hypertrophy. 5 mm anterolisthesis of L4 on L5. No change in alignment on flexion or extension. No evidence of instability.  SOFT TISSUES: No acute abnormality.  IMPRESSION: 1. 5 mm anterolisthesis of L4 on L5 without instability on flexion or extension. 2. Lower lumbar degenerative disc disease and facet hypertrophy.  Electronically signed by: Andrea Gasman MD 05/19/2024 09:26 PM EDT RP Workstation: [id number]  Sacroiliac Joint Imaging: Sacroiliac Joint DG: Results for orders placed during the hospital encounter of 05/07/24 DG Si Joints  Narrative EXAM: XR SI joints Left hip and pelvis, 2 or more views 05/07/2024 04:32:00 PM  CLINICAL HISTORY: Left hip pain/arthralgia. Pt reports increasing pain in neck, lower back, hip and joints.  COMPARISON: None available.  TECHNIQUE: AP and oblique views of the SI joints. AP and lateral views of the left hip with frontal view of the pelvis  FINDINGS: Pelvis and left hip: The hip joint space is preserved. Femoral head is well seated. No fracture, erosion or avascular necrosis. Minimal acetabular spurring. Bony pelvis is intact. 2 tubal ligation clips on  the left. There is a right os acetabulum.  Sacroiliac joints: The sacroiliac joints are preserved. Mild subchondral cystic change and sclerosis. Minimal inferior spurring. No erosions.  IMPRESSION: 1. Mild degenerative change of the sacroiliac joints. 2. Minor degenerative change of the left hip.  Electronically signed by: Andrea Gasman MD 05/19/2024 09:32 PM EDT RP Workstation: [id number]  Hip Imaging: Hip-L DG 2-3 views: Results for orders placed during the hospital encounter of 05/07/24 DG HIP UNILAT W OR W/O PELVIS 2-3 VIEWS LEFT  Narrative EXAM: XR SI joints Left hip  and pelvis, 2 or more views 05/07/2024 04:32:00 PM  CLINICAL HISTORY: Left hip pain/arthralgia. Pt reports increasing pain in neck, lower back, hip and joints.  COMPARISON: None available.  TECHNIQUE: AP and oblique views of the SI joints. AP and lateral views of the left hip with frontal view of the pelvis  FINDINGS: Pelvis and left hip: The hip joint space is preserved. Femoral head is well seated. No fracture, erosion or avascular necrosis. Minimal acetabular spurring. Bony pelvis is intact. 2 tubal ligation clips on the left. There is a right os acetabulum.  Sacroiliac joints: The sacroiliac joints are preserved. Mild subchondral cystic change and sclerosis. Minimal inferior spurring. No erosions.  IMPRESSION: 1. Mild degenerative change of the sacroiliac joints. 2. Minor degenerative change of the left hip.  Electronically signed by: Andrea Gasman MD 05/19/2024 09:32 PM EDT RP Workstation: [id number]  Knee Imaging: Knee-L DG 4 views: Results for orders placed during the hospital encounter of 12/30/15 DG Knee Complete 4 Views Left  Narrative CLINICAL DATA:  Pain for 1 month. Anterior pain radiating down the leg.  EXAM: LEFT KNEE - COMPLETE 4+ VIEW  COMPARISON:  None.  FINDINGS: There is no evidence of fracture, dislocation, or joint effusion. There is no evidence of  arthropathy or other focal bone abnormality. Soft tissues are unremarkable.  IMPRESSION: Negative.   Electronically Signed By: Julaine Blanch On: 12/30/2015 14:43  Hand Imaging: Hand-L DG Complete: Results for orders placed during the hospital encounter of 09/27/07 DG Hand Complete Left  Narrative Clinical Data: Left fifth finger injury. LEFT HAND - 3 VIEW: Findings: Soft tissue swelling laterally along the fifth metacarpal. No fracture or radiopaque foreign body.  Impression No acute bony finding.  Provider: Augustin Moats  Complexity Note: Imaging results reviewed.                         Meds   Current Outpatient Medications:    cyclobenzaprine  (FLEXERIL ) 10 MG tablet, Take 10 mg by mouth 3 (three) times daily as needed for muscle spasms., Disp: , Rfl:    diclofenac (VOLTAREN) 50 MG EC tablet, Take 50 mg by mouth 2 (two) times daily., Disp: , Rfl:    pantoprazole  (PROTONIX ) 40 MG tablet, Take 1 tablet (40 mg total) by mouth daily., Disp: 90 tablet, Rfl: 1   rizatriptan (MAXALT-MLT) 10 MG disintegrating tablet, Take 10 mg by mouth as needed for migraine. May repeat in 2 hours if needed, Disp: , Rfl:   ROS  Constitutional: Denies any fever or chills Gastrointestinal: No reported hemesis, hematochezia, vomiting, or acute GI distress Musculoskeletal: Denies any acute onset joint swelling, redness, loss of ROM, or weakness Neurological: No reported episodes of acute onset apraxia, aphasia, dysarthria, agnosia, amnesia, paralysis, loss of coordination, or loss of consciousness  Allergies  Ms. Deiter is allergic to aspirin , naproxen , sumatriptan succinate, and lactose intolerance (gi).  PFSH  Drug: Ms. Purewal  reports current drug use. Drug: Marijuana. Alcohol:  reports no history of alcohol use. Tobacco:  reports that she has been smoking cigars. She has never used smokeless tobacco. Medical:  has a past medical history of Allergy, Anemia (2019), Anginal pain (HCC) (2019),  Anxiety, Arthritis, Bipolar disorder (HCC), Depression, Dyspnea, Dysrhythmia (2019), GERD (gastroesophageal reflux disease), Herniated lumbar intervertebral disc, Hypertension (2019), Migraines, Panic attacks, and Pica (2019). Surgical: Ms. Kadel  has a past surgical history that includes Cesarean section; Colposcopy (2006); Cesarean section (Bilateral, 04/14/2018); and Tubal ligation (2019). Family: family  history includes Anxiety disorder in her maternal grandmother and mother; Autism in her son; Bipolar disorder in her maternal grandmother; Cancer in her maternal grandfather and paternal uncle; Diabetes in her mother; Diabetic kidney disease in her paternal uncle; Heart attack in her paternal grandfather; Heart attack (age of onset: 40) in her mother; OCD in her maternal grandmother; Schizophrenia in her maternal grandmother; Seizures in her father; Thyroid  disease in her paternal grandmother.  Constitutional Exam  General appearance: Well nourished, well developed, and well hydrated. In no apparent acute distress There were no vitals filed for this visit. BMI Assessment: Estimated body mass index is 35.15 kg/m as calculated from the following:   Height as of 05/07/24: 5' 4 (1.626 m).   Weight as of 05/07/24: 204 lb 12.8 oz (92.9 kg).  BMI interpretation table: BMI level Category Range association with higher incidence of chronic pain  <18 kg/m2 Underweight   18.5-24.9 kg/m2 Ideal body weight   25-29.9 kg/m2 Overweight Increased incidence by 20%  30-34.9 kg/m2 Obese (Class I) Increased incidence by 68%  35-39.9 kg/m2 Severe obesity (Class II) Increased incidence by 136%  >40 kg/m2 Extreme obesity (Class III) Increased incidence by 254%   Patient's current BMI Ideal Body weight  There is no height or weight on file to calculate BMI. Patient weight not recorded   BMI Readings from Last 4 Encounters:  05/07/24 35.15 kg/m  03/09/24 37.25 kg/m  10/29/23 35.70 kg/m  05/20/23 33.68 kg/m    Wt Readings from Last 4 Encounters:  05/07/24 204 lb 12.8 oz (92.9 kg)  03/09/24 217 lb (98.4 kg)  10/29/23 208 lb (94.3 kg)  05/20/23 196 lb 3.4 oz (89 kg)    Psych/Mental status: Alert, oriented x 3 (person, place, & time)       Eyes: PERLA Respiratory: No evidence of acute respiratory distress  Assessment & Plan  Primary Diagnosis & Pertinent Problem List: There were no encounter diagnoses. Visit Diagnosis: No diagnosis found. Problems updated and reviewed during this visit: No problems updated.  Plan of Care  Assessment and Plan             Pharmacotherapy (Medications Ordered): No orders of the defined types were placed in this encounter.  Procedure Orders    No procedure(s) ordered today   No orders of the defined types were placed in this encounter.  Lab Orders  No laboratory test(s) ordered today   Imaging Orders  No imaging studies ordered today   Referral Orders  No referral(s) requested today    Pharmacological management:  Opioid Analgesics: I will not be prescribing any opioids at this time Membrane stabilizer: I will not be prescribing any at this time Muscle relaxant: I will not be prescribing any at this time NSAID: I will not be prescribing any at this time Other analgesic(s): I will not be prescribing any at this time      Interventional Therapies  Risk Factors  Considerations  Medical Comorbidities:  GERD  HTN  tobacco dependence     Planned  Pending:      Under consideration:   Pending   Completed: (Analgesic benefit)1  None at this time   Therapeutic  Palliative (PRN) options:   None established   Completed by other providers:   Procedures done at [facility] & [facility])   1(Analgesic benefit): Expressed in percentage (%). (Local anesthetic[LA] +/- sedation  L.A.Local Anesthetic  Steroid benefit  Ongoing benefit)      Provider-requested follow-up: No follow-ups  on file. Recent Visits Date  Type Provider Dept  05/07/24 Office Visit Tanya Glisson, MD [facility]  Showing recent visits within past 90 days and meeting all other requirements Future Appointments Date Type Provider Dept  05/30/24 Appointment Tanya Glisson, MD [facility]  Showing future appointments within next 90 days and meeting all other requirements   Primary Care Physician: [facility]  Duration of encounter: *** minutes.  Total time on encounter, as per AMA guidelines included both the face-to-face and non-face-to-face time personally spent by the physician and/or other qualified health care professional(s) on the day of the encounter (includes time in activities that require the physician or other qualified health care professional and does not include time in activities normally performed by clinical staff). Physician's time may include the following activities when performed: Preparing to see the patient (e.g., pre-charting review of records, searching for previously ordered imaging, lab work, and nerve conduction tests) Review of prior analgesic pharmacotherapies. Reviewing PMP Interpreting ordered tests (e.g., lab work, imaging, nerve conduction tests) Performing post-procedure evaluations, including interpretation of diagnostic procedures Obtaining and/or reviewing separately obtained history Performing a medically appropriate examination and/or evaluation Counseling and educating the patient/family/caregiver Ordering medications, tests, or procedures Referring and communicating with other health care professionals (when not separately reported) Documenting clinical information in the electronic or other health record Independently interpreting results (not separately reported) and communicating results to the patient/ family/caregiver Care coordination (not separately reported)  Note by: Glisson DELENA Tanya, MD (TTS technology used. I apologize for  any typographical errors that were not detected and corrected.) Date: 05/30/2024; Time: 6:47 AM

## 2024-05-30 ENCOUNTER — Encounter: Payer: Self-pay | Admitting: Pain Medicine

## 2024-05-30 ENCOUNTER — Ambulatory Visit: Attending: Pain Medicine | Admitting: Pain Medicine

## 2024-05-30 VITALS — BP 132/93 | HR 63 | Temp 97.0°F | Resp 16 | Ht 64.0 in | Wt 204.0 lb

## 2024-05-30 DIAGNOSIS — M47812 Spondylosis without myelopathy or radiculopathy, cervical region: Secondary | ICD-10-CM | POA: Diagnosis not present

## 2024-05-30 DIAGNOSIS — R7982 Elevated C-reactive protein (CRP): Secondary | ICD-10-CM | POA: Diagnosis present

## 2024-05-30 DIAGNOSIS — M533 Sacrococcygeal disorders, not elsewhere classified: Secondary | ICD-10-CM | POA: Insufficient documentation

## 2024-05-30 DIAGNOSIS — M47817 Spondylosis without myelopathy or radiculopathy, lumbosacral region: Secondary | ICD-10-CM

## 2024-05-30 DIAGNOSIS — M545 Low back pain, unspecified: Secondary | ICD-10-CM | POA: Diagnosis present

## 2024-05-30 DIAGNOSIS — M47816 Spondylosis without myelopathy or radiculopathy, lumbar region: Secondary | ICD-10-CM

## 2024-05-30 DIAGNOSIS — R892 Abnormal level of other drugs, medicaments and biological substances in specimens from other organs, systems and tissues: Secondary | ICD-10-CM | POA: Insufficient documentation

## 2024-05-30 DIAGNOSIS — M542 Cervicalgia: Secondary | ICD-10-CM

## 2024-05-30 DIAGNOSIS — M79605 Pain in left leg: Secondary | ICD-10-CM | POA: Insufficient documentation

## 2024-05-30 DIAGNOSIS — M4316 Spondylolisthesis, lumbar region: Secondary | ICD-10-CM | POA: Diagnosis present

## 2024-05-30 DIAGNOSIS — G8929 Other chronic pain: Secondary | ICD-10-CM

## 2024-05-30 DIAGNOSIS — R94130 Abnormal response to nerve stimulation, unspecified: Secondary | ICD-10-CM | POA: Diagnosis present

## 2024-05-30 DIAGNOSIS — G5601 Carpal tunnel syndrome, right upper limb: Secondary | ICD-10-CM | POA: Diagnosis present

## 2024-05-30 DIAGNOSIS — M461 Sacroiliitis, not elsewhere classified: Secondary | ICD-10-CM | POA: Insufficient documentation

## 2024-05-30 DIAGNOSIS — G4486 Cervicogenic headache: Secondary | ICD-10-CM

## 2024-05-30 DIAGNOSIS — M5459 Other low back pain: Secondary | ICD-10-CM | POA: Insufficient documentation

## 2024-05-30 NOTE — Progress Notes (Signed)
 Safety precautions to be maintained throughout the outpatient stay will include: orient to surroundings, keep bed in low position, maintain call bell within reach at all times, provide assistance with transfer out of bed and ambulation.

## 2024-05-30 NOTE — Patient Instructions (Addendum)
Facet Joint Block The facet joints connect the bones of the spine (vertebrae). They let you bend, twist, and make other movements with your spine. They also keep you from bending too far, twisting too far, and making other extreme movements. A facet joint block is a procedure where a numbing medicine (local anesthesia) is injected into a facet joint. Many times, a medicine for inflammation (steroid) is also injected. A facet joint block may be done: To diagnose neck or back pain. If the pain gets better after a facet joint block, the pain is likely coming from the facet joint. If the pain does not get better, the pain is likely not coming from the facet joint. To treat neck or back pain caused by an inflamed facet joint. To help you with physical therapy or other rehab (rehabilitation) exercises. Tell a health care provider about: Any allergies you have. All medicines you are taking, including vitamins, herbs, eye drops, creams, and over-the-counter medicines. Any problems you or family members have had with anesthesia. Any bleeding problems you have. Any surgeries you have had. Any medical conditions you have or have had. Whether you are pregnant or may be pregnant. What are the risks? Your health care provider will talk with you about risks. These may include: Infection. Allergic reactions to medicines or dyes. Bleeding. Injury to a nerve near where the needle was put in (injection site). Pain at the injection site. Short-term weakness or numbness in areas near the nerves at the injection site. What happens before the procedure? When to stop eating and drinking Follow instructions from your health care provider about what you may eat and drink. Medicines Ask your health care provider about: Changing or stopping your regular medicines. These include any diabetes medicines or blood thinners you take. Taking medicines such as aspirin  and ibuprofen . These medicines can thin your blood. Do  not take these medicines unless your health care provider tells you to. Taking over-the-counter medicines, vitamins, herbs, and supplements. General instructions If you will be going home right after the procedure, plan to have a responsible adult: Take you home from the hospital or clinic. You will not be allowed to drive. Care for you for the time you are told. Ask your health care provider: How your injection site will be marked. What steps will be taken to help prevent infection. These may include washing skin with a soap that kills germs. What happens during the procedure?  An IV will be inserted into one of your veins. You will lie on your stomach on an X-ray table. You may be asked to lie in a different position if you will be getting an injection in your neck. Your injection site will be cleaned with a soap that kills germs and then covered with a germ-free (sterile) drape. A local anesthesia will be put in at the injection site. A type of X-ray machine (fluoroscopy) or CT scan will be used to help find your facet joint. A contrast dye may also be injected into your joint to help show if the needle is at the joint. When your provider knows the needle is at your joint, they will inject anesthesia and anti-inflammatory medicine as needed. The needle will be removed. Pressure will be applied to keep your injection site from bleeding. A bandage (dressing) will be placed over each injection site. The procedure may vary among health care providers and hospitals. What happens after the procedure? Your blood pressure, heart rate, breathing rate, and blood oxygen level  will be monitored until you leave the hospital or clinic. This information is not intended to replace advice given to you by your health care provider. Make sure you discuss any questions you have with your health care provider. Document Revised: 03/19/2022 Document Reviewed: 03/19/2022 Elsevier Patient Education  2024  [employer] ______________________________________________________________________    Procedure instructions  Stop blood-thinners  Do not eat or drink fluids (other than water) for 6 hours before your procedure  No water for 2 hours before your procedure  Take your blood pressure medicine with a sip of water  Arrive 30 minutes before your appointment  If sedation is planned, bring suitable driver. [place], [place], & public transportation are NOT APPROVED)  Carefully read the Preparing for your procedure detailed instructions  If you have questions call us  at [phone]  Procedure appointments are for procedures only.   NO medication refills or new problem evaluations will be done on procedure days.   Only the scheduled, pre-approved procedure and side will be done.   ______________________________________________________________________     ______________________________________________________________________    Preparing for your procedure  Appointments: If you think you may not be able to keep your appointment, call 24-48 hours in advance to cancel. We need time to make it available to others.  Procedure visits are for procedures only. During your procedure appointment there will be: NO Prescription Refills*. NO medication changes or discussions*. NO discussion of disability issues*. NO unrelated pain problem evaluations*. NO evaluations to order other pain procedures*. *These will be addressed at a separate and distinct evaluation encounter on the provider's evaluation schedule and not during procedure days.  Instructions: Food intake: Avoid eating anything solid for at least 8 hours prior to your procedure. Clear liquid intake: You may take clear liquids such as water up to 2 hours prior to your procedure. (No carbonated drinks. No soda.) Transportation: Unless otherwise stated by your physician, bring a driver. (Driver cannot be a Market researcher, Pharmacist, community, or any other  form of public transportation.) Morning Medicines: Except for blood thinners, take all of your other morning medications with a sip of water. Make sure to take your heart and blood pressure medicines. If your blood pressure's lower number is above 100, the case will be rescheduled. Blood thinners: Make sure to stop your blood thinners as instructed.  If you take a blood thinner, but were not instructed to stop it, call our office [phone] and ask to talk to a nurse. Not stopping a blood thinner prior to certain procedures could lead to serious complications. Diabetics on insulin: Notify the staff so that you can be scheduled 1st case in the morning. If your diabetes requires high dose insulin, take only  of your normal insulin dose the morning of the procedure and notify the staff that you have done so. Preventing infections: Shower with an antibacterial soap the morning of your procedure.  Build-up your immune system: Take 1000 mg of Vitamin C with every meal (3 times a day) the day prior to your procedure. Antibiotics: Inform the nursing staff if you are taking any antibiotics or if you have any conditions that may require antibiotics prior to procedures. (Example: recent joint implants)   Pregnancy: If you are pregnant make sure to notify the nursing staff. Not doing so may result in injury to the fetus, including death.  Sickness: If you have a cold, fever, or any active infections, call and cancel or reschedule your procedure. Receiving steroids while having an infection may  result in complications. Arrival: You must be in the facility at least 30 minutes prior to your scheduled procedure. Tardiness: Your scheduled time is also the cutoff time. If you do not arrive at least 15 minutes prior to your procedure, you will be rescheduled.  Children: Do not bring any children with you. Make arrangements to keep them home. Dress appropriately: There is always a possibility that your clothing may get  soiled. Avoid long dresses. Valuables: Do not bring any jewelry or valuables.  Reasons to call and reschedule or cancel your procedure: (Following these recommendations will minimize the risk of a serious complication.) Surgeries: Avoid having procedures within 2 weeks of any surgery. (Avoid for 2 weeks before or after any surgery). Flu Shots: Avoid having procedures within 2 weeks of a flu shots or . (Avoid for 2 weeks before or after immunizations). Barium: Avoid having a procedure within 7-10 days after having had a radiological study involving the use of radiological contrast. (Myelograms, Barium swallow or enema study). Heart attacks: Avoid any elective procedures or surgeries for the initial 6 months after a Myocardial Infarction (Heart Attack). Blood thinners: It is imperative that you stop these medications before procedures. Let us  know if you if you take any blood thinner.  Infection: Avoid procedures during or within two weeks of an infection (including chest colds or gastrointestinal problems). Symptoms associated with infections include: Localized redness, fever, chills, night sweats or profuse sweating, burning sensation when voiding, cough, congestion, stuffiness, runny nose, sore throat, diarrhea, nausea, vomiting, cold or Flu symptoms, recent or current infections. It is specially important if the infection is over the area that we intend to treat. Heart and lung problems: Symptoms that may suggest an active cardiopulmonary problem include: cough, chest pain, breathing difficulties or shortness of breath, dizziness, ankle swelling, uncontrolled high or unusually low blood pressure, and/or palpitations. If you are experiencing any of these symptoms, cancel your procedure and contact your primary care physician for an evaluation.  Remember:  Regular Business hours are:  Monday to Thursday 8:00 AM to 4:00 PM  Provider's Schedule: Eric Como, MD:  Procedure days: Tuesday and  Thursday 7:30 AM to 4:00 PM  Wallie Sherry, MD:  Procedure days: Monday and Wednesday 7:30 AM to 4:00 PM Last  Updated: 08/30/2023 ______________________________________________________________________     ______________________________________________________________________    General Risks and Possible Complications  Patient Responsibilities: It is important that you read this as it is part of your informed consent. It is our duty to inform you of the risks and possible complications associated with treatments offered to you. It is your responsibility as a patient to read this and to ask questions about anything that is not clear or that you believe was not covered in this document.  Patient's Rights: You have the right to refuse treatment. You also have the right to change your mind, even after initially having agreed to have the treatment done. However, under this last option, if you wait until the last second to change your mind, you may be charged for the materials used up to that point.  Introduction: Medicine is not an Visual merchandiser. Everything in Medicine, including the lack of treatment(s), carries the potential for danger, harm, or loss (which is by definition: Risk). In Medicine, a complication is a secondary problem, condition, or disease that can aggravate an already existing one. All treatments carry the risk of possible complications. The fact that a side effects or complications occurs, does not imply that the treatment  was conducted incorrectly. It must be clearly understood that these can happen even when everything is done following the highest safety standards.  No treatment: You can choose not to proceed with the proposed treatment alternative. The "PRO(s)" would include: avoiding the risk of complications associated with the therapy. The "CON(s)" would include: not getting any of the treatment benefits. These benefits fall under one of three categories: diagnostic;  therapeutic; and/or palliative. Diagnostic benefits include: getting information which can ultimately lead to improvement of the disease or symptom(s). Therapeutic benefits are those associated with the successful treatment of the disease. Finally, palliative benefits are those related to the decrease of the primary symptoms, without necessarily curing the condition (example: decreasing the pain from a flare-up of a chronic condition, such as incurable terminal cancer).  General Risks and Complications: These are associated to most interventional treatments. They can occur alone, or in combination. They fall under one of the following six (6) categories: no benefit or worsening of symptoms; bleeding; infection; nerve damage; allergic reactions; and/or death. No benefits or worsening of symptoms: In Medicine there are no guarantees, only probabilities. No healthcare provider can ever guarantee that a medical treatment will work, they can only state the probability that it may. Furthermore, there is always the possibility that the condition may worsen, either directly, or indirectly, as a consequence of the treatment. Bleeding: This is more common if the patient is taking a blood thinner, either prescription or over the counter (example: Goody Powders, Fish oil, Aspirin , Garlic, etc.), or if suffering a condition associated with impaired coagulation (example: Hemophilia, cirrhosis of the liver, low platelet counts, etc.). However, even if you do not have one on these, it can still happen. If you have any of these conditions, or take one of these drugs, make sure to notify your treating physician. Infection: This is more common in patients with a compromised immune system, either due to disease (example: diabetes, cancer, human immunodeficiency virus [HIV], etc.), or due to medications or treatments (example: therapies used to treat cancer and rheumatological diseases). However, even if you do not have one on  these, it can still happen. If you have any of these conditions, or take one of these drugs, make sure to notify your treating physician. Nerve Damage: This is more common when the treatment is an invasive one, but it can also happen with the use of medications, such as those used in the treatment of cancer. The damage can occur to small secondary nerves, or to large primary ones, such as those in the spinal cord and brain. This damage may be temporary or permanent and it may lead to impairments that can range from temporary numbness to permanent paralysis and/or brain death. Allergic Reactions: Any time a substance or material comes in contact with our body, there is the possibility of an allergic reaction. These can range from a mild skin rash (contact dermatitis) to a severe systemic reaction (anaphylactic reaction), which can result in death. Death: In general, any medical intervention can result in death, most of the time due to an unforeseen complication. ______________________________________________________________________     ______________________________________________________________________    TENS (Device can be purchased online, without prescription. Search: TENS 7000.) Transcutaneous electrical nerve stimulation (TENS) is a method of pain relief that involves the use of mild electrical stimulation. A TENS machine is a small, battery-operated device that has leads connected to sticky pads called electrodes. Available at [employer]. (Estimated price as of July 10th, 2025.)  (Estimated [employer] cost: $  38.88) Rechargeable 9V batteries:  (Estimated [employer] cost: $12.98)  Larger Reusable 2 x 4 TENS Pads/Electrodes:  (Estimated [employer] cost: $9.99)  Total cost: $61.85    ELECTRODE PLACEMENT:   TENS UNIT SAFETY WARNING SHEET and INFORMATION INDICATIONS AND CONTRAINDICTIONS Read the operation manual before using the device. Federal law ([place] ) restricts this device to sale by or on the  order of a physician. Observe your physician's precise instructions and let him show you where to apply the electrodes. For a successful therapy, the correct application of the electrodes is an important factor. Carefully write down the settings your physician recommended. Indications for use This device is a prescription device and only for symptomatic relief of chronic intractable pain.  Contraindications:   Any electrode placement that applies current to the carotid sinus (neck) region.   Patients with implanted electronic devices (for example, a pacemaker) or metallic implants should not undertake.   Any electrode placement that causes current to flow transcerebrally (through the head). The use of unit whenever pain symptoms are undiagnosed, unit etiology is determined.   The use of TENS whenever pain syndromes are undiagnosed, until etiology is established.   WARNINGS AND PRECAUTIONS  Warnings:   The device must be kept out of reach of children.   The safety of device for use during pregnancy or delivery has not been established.   Do not place electrodes on front of the throat. This may result in spasms of the laryngeal and pharyngeal muscles.   Do not place the electrodes over the carotid nerve (side of neck below ear).   The device is not effective for pain of central origin (headaches).   The device may interfere with electronic monitoring equipment (such as ECG monitors and ECG alarms).   Electrodes should not be placed over the eyes, in the mouth, or internally.   These devices have no curative value.   TENS devices should be used only under the continued supervision of a physician.   TENS is a symptomatic treatment and as such suppresses the sensation of pain which would otherwise serve as a protective mechanism. Precautions/Adverse Reactions   Isolated cases of skin irritation may occur at the site of electrode placement following long-term application.   Stimulation should be stopped and  electrodes removed until the cause of the irritation can be determined.   Effectiveness is highly dependent upon patient selection by a person qualified in the management of pain patients.   If the device treatment becomes ineffective or unpleasant, stimulation should be discontinued until reevaluation by a physician/clinician.   Always turn the device off before applying or removing electrodes.   Skin irritation and electrode burns are potential adverse reactions.  PURPOSE: A Transcutaneous Electrical Nerve Stimulator, or TENS, unit is designed to relieve post-operative, acute and chronic pain. It is used for pain caused by peripheral nerves and not central. TENS units are prescription-only devices.   OPERATION: TENS units work in a couple of ways. The first way they are thought to work is by a method called the [employer]. The [employer] states that our brains can only handle one stimulus at a time. When you have chronic pain, this pain signal is constantly being sent to your brain and recognized as pain. When an electrical stimulus is added to the area of pain the body feels this electrical stimulus, and since the brain can only handle one thing at a time, the pain is not transmitted to the brain. The second method thought  to be part of TENS unit's success is by way of stimulating our own bodies to release their own natural painkillers. TENS units do not work for everyone and results may vary. Always follow the instructions and warnings in your user's manual.   USE: One of the most important tasks that must be performed is battery maintenance. If you are using a Engineering geologist, always fully charge it and fully deplete it before charging it again. These batteries can develop memories and by not performing this charging task correctly, your battery's life can be greatly diminished. If your battery does develop a memory you can help expand the memory by charging for 12 - 13 hours and  then completely depleting the battery. Always prepare the skin before applying electrodes. Your skin should be clean and free of any lotions or creams. If you are using electrodes that use conductive gel, apply a small, even layer over the electrode. For carbon, self-adhesive electrodes, apply a drop of water to the electrodes before applying to the skin. The electrodes attach to the lead wires and then the TENS unit. Always grasp the connector and not the cord when inserting or removing. When making adjustments, always make sure the unit's channels (1 and 2) are in the OFF position. The actual settings should be recommended and prescribed by your physician. Medical equipment suppliers don'tset or instruct users as to user settings. When you are using the BURST mode, the unit delivers a series of quick pulses followed by a rest. This cycle repeats itself frequently. Always have channels OFF before changing modes.   For MODULATION mode, the stimulation automatically varies the width of the pulse.   For CONVENTIONAL mode, the stimulation is constant. After the settings have been fine-tuned, set the timer to 30 or 60 minutes. Your physician should also prescribe the use time. When the lights become dim, it means your batteries should be replaced or recharged.   ACCESSORIES: The electrodes and lead wires can be obtained from your medical equipment supplier. Your medical equipment supplier can set up a recurring delivery to accommodate your needs. Electrodes should be replaced once a month and lead wires once every 6 months.  Video Tutorial https://[email or web address].be/[email or web address]  ______________________________________________________________________     ____________________________________________________________________________________________  Spondylolisthesis  Spondylolisthesis is a condition that occurs when a vertebra in the spine slips out of place, usually in the lower back. Symptoms  can vary from mild to severe, and a person may have no symptoms.  Some common symptoms include:  Back pain, especially chronic pain  Pain that radiates down the legs  Pain that worsens with exercise  Tightness in the hamstrings  Neck stiffness  Loss of spine flexibility  Weakness in the legs or trouble walking  Numbness and tingling in the groin and/or buttocks   Some causes of spondylolisthesis include: Birth defects. Sudden injury. Abnormal wear on the cartilage and bones, such as arthritis. Bone disease and fractures. Certain sports activities, such as gymnastics, weightlifting, and football.  A doctor can diagnose spondylolisthesis with a physical exam, X-rays, and possibly a CT scan.    Forward slippage is known as "Anterolisthesis".  Backward slippage is known as "Retrolisthesis".   Pathophysiology of Spondylolisthesis:   Grading Classification of Spondylolisthesis Grade I spondylolisthesis is 1 to 25% slippage, grade II is up to 50% slippage, grade III is up to 75% slippage, and grade IV is 76-100% slippage. If there is more than 100% slippage, it is known as spondyloptosis or grade V spondylolisthesis.  ____________________________________________________________________________________________     

## 2024-05-31 ENCOUNTER — Telehealth: Payer: Self-pay

## 2024-05-31 NOTE — Telephone Encounter (Addendum)
 You put in her notes that she had an RFA with [facility] in July.. Most of these insurances dont want to approve repeat facets after an RFA.  Because diagnostic after she already had the RFA doesn't make sense to them. I can try but it is unlikely they will approve it. And I cannot request another RFA for the left side for 6 months

## 2024-06-05 NOTE — Progress Notes (Signed)
 Chief Complaint  Patient presents with  . Otalgia  . Headache  . Emesis  . Diarrhea  . Nausea  . Chills    Subjective  Dominique Dyer is a 43 y.o. female who presents for Otalgia, Headache, Emesis, Diarrhea, Nausea, and Chills HPI History of Present Illness Dominique Dyer is a 43 year old female who presents with ear pain and flu-like symptoms.  She has been experiencing mild ear pain in her left ear for the past two to three weeks. She has not taken any medication for it but tried using peroxide, suspecting a wax issue.  Yesterday, she woke up at 3 AM with vomiting, diarrhea, and a fever of 99.93F. She experienced hot and cold flashes, and her fever persisted into the morning but has since decreased. She notes a fast heart rate of 111 bpm and a slight chest tightness, which she describes as not as severe as during a respiratory infection. She also mentions a 'weird sensation' on the left side of her chest. She has a history of frequent respiratory infections and mentions a recent exposure to a sick coworker.  She has been experiencing hot flashes for about a year and has not had a menstrual period in the same duration. She started menstruating at age 48 and is concerned about early menopause, especially given her mother's history of menopause at 67 and a heart attack at 57.  She has been diagnosed with HPV and is concerned about cancer-related issues due to the virus. She has a wart on her lip and finger and has not had a Pap smear in over eight years.  She does not smoke tobacco but uses marijuana without tobacco. She has reduced her alcohol intake significantly, influenced by her family history of alcoholism, including her mother's recent sobriety.  Review of Systems  Patient Active Problem List  Diagnosis  . Hypertension  . H/O Abnormal Pap smear of cervix  . Bipolar 1 disorder (CMS/HHS-HCC)  . Anxiety  . Panic attacks  . GERD (gastroesophageal reflux disease)  . Intractable  chronic migraine without aura and without status migrainosus  . Bilateral occipital neuralgia  . Bilateral carpal tunnel syndrome  . Cervicogenic headache  . Advanced maternal age in multigravida (HHS-HCC)  . Anemia, postpartum (HHS-HCC)  . Atypical chest pain  . 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 B12 deficiency  . Elevated blood pressure affecting pregnancy in third trimester, antepartum (HHS-HCC)  . History of cervical dysplasia  . History of pregnancy induced hypertension  . Insomnia  . Iron  deficiency anemia  . Leucocytosis  . Marijuana abuse  . Sinus tachycardia  . Obesity (BMI 30-39.9), unspecified  . Tobacco dependence  . Anemia  . Dry skin  . Umbilical hernia without obstruction and without gangrene  . Rib pain  . Prediabetes  . Abnormal drug screen  . Abnormal NCS (nerve conduction studies)  . Cervical spine crepitus  . Chronic left sacroiliac joint pain  . Chronic hip pain, left  . Chronic pain of left lower extremity  . Disorder of skeletal system  . Elevated C-reactive protein (CRP)  . Facet hypertrophy of cervical region  . Facet hypertrophy of lumbosacral region  . Low back pain of over 3 months duration  . Myofascial pain  . Neck pain of over 3 months duration  . Osteoarthritis of both sacroiliac joints ()  . Pain of cervical facet joint  . Chronic  bilateral low back pain without sciatica  . Chronic pain syndrome  . Intractable low back pain  . Lumbar radiculopathy  . Spondylolisthesis of lumbar region  . Myofascial pain syndrome of lumbar spine  . Cervicalgia    Outpatient Medications Prior to Visit  Medication Sig Dispense Refill  . albuterol MDI, PROVENTIL, VENTOLIN, PROAIR, HFA 90 mcg/actuation inhaler Inhale 2 inhalations into the lungs every 6 (six) hours as needed    . cyclobenzaprine  (FLEXERIL ) 10 MG tablet Take 10 mg by mouth 3 (three)  times daily    . diclofenac (VOLTAREN) 75 MG EC tablet Take 75 mg by mouth 2 (two) times daily with meals    . DULoxetine (CYMBALTA) 30 MG DR capsule     . lamoTRIgine (LAMICTAL) 200 MG tablet Take 100 mg by mouth once daily Takes 100mg  daily    . pantoprazole  (PROTONIX ) 40 MG DR tablet Take 1 tablet (40 mg total) by mouth once daily 30 tablet 11  . REXULTI 3 mg Tab Take 1 tablet by mouth once daily    . rizatriptan (MAXALT) 10 MG tablet Take 1 tablet (10 mg total) by mouth as directed for Migraine May take a second dose after 2 hours if needed. 6 tablet 6  . acetaminophen  (TYLENOL ) 650 MG ER tablet Take 650 mg by mouth every 8 (eight) hours as needed for Pain    . ALPRAZolam  (XANAX  XR) 0.5 MG 24 hr tablet Take 1 mg by mouth 2 (two) times daily    . diazePAM (VALIUM) 10 MG tablet TAKE 1 TABLET BY MOUTH 30 MINUTES PRIOR TO PROCEDURE    . ergocalciferol , vitamin D2, 1,250 mcg (50,000 unit) capsule Take 1 capsule (50,000 Units total) by mouth once a week for 8 doses 8 capsule 0  . magnesium gluconate (MAG-G) 27 mg (500 mg) tablet Take 500 mg by mouth    . ondansetron  (ZOFRAN -ODT) 4 MG disintegrating tablet TAKE 1 TABLET BY MOUTH EVERY 8 HOURS AS NEEDED FOR UP TO 5 DAYS    . ALPRAZOLAM  (XANAX  ORAL) Take 1.5 mg by mouth.   (Patient not taking: Reported on 03/06/2024)    . ALPRAZolam  (XANAX  XR) 0.5 MG 24 hr tablet Take 0.5 mg by mouth every morning    . cyclobenzaprine  (FLEXERIL ) 5 MG tablet Take 1 tablet (5 mg total) by mouth 3 (three) times daily as needed (Patient not taking: Reported on 03/06/2024) 40 tablet 1  . hydrOXYzine HCL (ATARAX) 10 MG tablet Take 10 mg by mouth 3 (three) times daily as needed for Itching (Patient not taking: Reported on 03/06/2024)    . meloxicam  (MOBIC ) 15 MG tablet Take 1 tablet (15 mg total) by mouth once daily (Patient not taking: Reported on 11/14/2023) 30 tablet 1  . methocarbamoL (ROBAXIN) 750 MG tablet TAKE 1 TABLET 3 TIMES A DAY BY ORAL ROUTE AS NEEDED, FOR SEVERE  MUSCULAR PAIN. (Patient not taking: Reported on 03/06/2024)    . zolpidem  (AMBIEN ) 10 mg tablet Take 10 mg by mouth at bedtime     No facility-administered medications prior to visit.      Objective  Vitals:   06/05/24 1638 06/05/24 1641  BP:  (!) 126/91  Pulse:  (!) 111  Temp:  36.7 C (98 F)  TempSrc:  Oral  SpO2:  99%  Weight: 90.3 kg (199 lb) 90.3 kg (199 lb)  PainSc:    4  PainLoc:  Head - Frontal   Body mass index is 34.16 kg/m.  Home Vitals:  Physical Exam Physical Exam VITALS: T- 99.6, P- 111 HEENT: Ears without bulging or signs of infection. Nasal passages clear, no discharge. CHEST: Lungs clear to auscultation bilaterally.  Constitutional: alert, in NAD, and communicates well Eye exam: pupils equal and reactive, extraocular eye movements intact. Neck: supple and no thyroid  enlargement or cervical adenopathy Respiratory: clear to auscultation, without rales or wheezes  Cardiovascular: regular rate and rhythm and without murmurs, rubs or gallops Lower extremities: no lower extremity edema Skin ankles/feet: warm, good capillary refill Neurological: sensorimotor grossly intact and normal muscle tone  Results       Assessment/Plan:   Assessment & Plan Acute upper respiratory infection Presents with symptoms consistent with an acute upper respiratory infection, including ear pain, fever, nausea, diarrhea, and chills. Symptoms began approximately two to three weeks ago. No evidence of ear infection upon examination; symptoms likely due to nasal congestion and post-nasal drip. Differential diagnosis includes viral infections such as flu, COVID-19, and RSV, especially given recent exposure to a sick coworker. - Test for flu, COVID-19, and RSV. - Prescribe benzonatate  capsules for cough. - Prescribe ipratropium nasal spray to be used at breakfast, lunch, and dinner for congestion. - Advise to drink plenty of fluids and consume chicken soup for  recovery.  Menopausal symptoms Experiencing hot flashes for about a year and has not had a menstrual period in the same duration. Family history of early menopause suggests a similar pattern. Reports growth of facial hair, possibly related to hormonal changes associated with menopause.  Human papillomavirus (HPV) infection Concern about potential cancer risks. Reports a wart on her lip and finger and has not had a Pap smear in over eight years, raising concern for cervical cancer screening. - Schedule a Pap smear for cervical cancer screening. - Recommend probiotics and prebiotics to support immune health.  Overweight Actively working on weight reduction and has lost nearly five pounds since last visit. Family history of heart disease underscores the importance of weight management.  Cannabis use Reports using cannabis without tobacco and has significantly reduced consumption. Aware of the legal implications of cannabis use in [place] . Diagnoses and all orders for this visit:  Respiratory infection -     Respiratory Virus, Basic Panel, PCR  Acute cough -     benzonatate  (TESSALON ) 200 MG capsule; Take 1 capsule (200 mg total) by mouth 3 (three) times daily as needed for Cough for up to 7 days  Nasal congestion -     Discontinue: ipratropium (ATROVENT) 0.06 % nasal spray; Place 2 sprays into both nostrils 3 (three) times daily for 14 days    This visit was coded based on medical decision making (MDM).           Future Appointments     Date/Time Provider Department Center Visit Type   06/11/2024 5:00 PM (Arrive by 4:45 PM) Eliverto Bette Hover, MD [facility] PHYSICAL   06/25/2024 1:00 PM (Arrive by 12:45 PM) [facility] PFT NEW   06/25/2024 2:00 PM (Arrive by 1:45 PM) Keturah Quan, MD [facility] NEW PATIENT   07/11/2024 11:15 AM Maree Jannett Hering, MD  [facility] RETURN VISIT       There are no Patient Instructions on file for this visit.  An after visit summary was provided for the patient either in written format (printed) or through [facility].  This note has been created  using automated tools and reviewed for accuracy by MARIO E OLMEDO.

## 2024-06-19 DIAGNOSIS — R339 Retention of urine, unspecified: Secondary | ICD-10-CM | POA: Insufficient documentation

## 2024-06-19 DIAGNOSIS — R102 Pelvic and perineal pain unspecified side: Secondary | ICD-10-CM | POA: Insufficient documentation

## 2024-06-19 DIAGNOSIS — R399 Unspecified symptoms and signs involving the genitourinary system: Secondary | ICD-10-CM | POA: Insufficient documentation

## 2024-06-19 DIAGNOSIS — R350 Frequency of micturition: Secondary | ICD-10-CM | POA: Insufficient documentation

## 2024-06-25 ENCOUNTER — Encounter: Payer: Self-pay | Admitting: Pain Medicine

## 2024-06-25 ENCOUNTER — Encounter: Payer: Self-pay | Admitting: Family

## 2024-06-25 DIAGNOSIS — R102 Pelvic and perineal pain unspecified side: Secondary | ICD-10-CM

## 2024-06-27 ENCOUNTER — Ambulatory Visit: Attending: Nurse Practitioner | Admitting: Nurse Practitioner

## 2024-06-27 ENCOUNTER — Encounter: Payer: Self-pay | Admitting: Nurse Practitioner

## 2024-06-27 VITALS — BP 124/75 | HR 90 | Temp 97.4°F | Ht 64.0 in | Wt 206.0 lb

## 2024-06-27 DIAGNOSIS — M542 Cervicalgia: Secondary | ICD-10-CM | POA: Insufficient documentation

## 2024-06-27 DIAGNOSIS — M545 Low back pain, unspecified: Secondary | ICD-10-CM | POA: Diagnosis present

## 2024-06-27 DIAGNOSIS — M5459 Other low back pain: Secondary | ICD-10-CM | POA: Diagnosis present

## 2024-06-27 DIAGNOSIS — G8929 Other chronic pain: Secondary | ICD-10-CM | POA: Diagnosis present

## 2024-06-27 DIAGNOSIS — M79605 Pain in left leg: Secondary | ICD-10-CM | POA: Diagnosis not present

## 2024-06-27 DIAGNOSIS — G894 Chronic pain syndrome: Secondary | ICD-10-CM | POA: Insufficient documentation

## 2024-06-27 NOTE — Progress Notes (Signed)
 Safety precautions to be maintained throughout the outpatient stay will include: orient to surroundings, keep bed in low position, maintain call bell within reach at all times, provide assistance with transfer out of bed and ambulation.

## 2024-06-27 NOTE — Progress Notes (Signed)
 PROVIDER NOTE: Interpretation of information contained herein should be left to medically-trained personnel. Specific patient instructions are provided elsewhere under Patient Instructions section of medical record. This document was created in part using AI and STT-dictation technology, any transcriptional errors that may result from this process are unintentional.  Patient: Dominique Dyer  Service: E/M   PCP: [facility]  DOB: 1980-11-16  DOS: 06/27/2024  Provider: Emmy MARLA Blanch, NP  MRN: [id number]  Delivery: Face-to-face  Specialty: Interventional Pain Management  Type: Established Patient  Setting: Ambulatory outpatient facility  Specialty designation: 09  Referring Prov.: [facility] *  Location: Outpatient office facility       History of present illness (HPI) Dominique Dyer, a 43 y.o. year old female, is here today because of her Intractable low back pain [M54.59]. 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 Location: Back Lower, Left, Right/pain radiaties down both side of her back, hip down to her big toe. Onset: More than a month ago. Quality: Aching, Burning, Constant, Tiring, Numbness, Headache. Timing: Constant. Modifying  factor(s): ice and heat, hot shower. TEN's unit. Vitals:  height is 5' 4 (1.626 m) and weight is 206 lb (93.4 kg). Her temperature is 97.4 F (36.3 C) (abnormal). Her blood pressure is 124/75 and her pulse is 90. Her oxygen saturation is 100%.  BMI: Estimated body mass index is 35.36 kg/m as calculated from the following:   Height as of this encounter: 5' 4 (1.626 m).   Weight as of this encounter: 206 lb (93.4 kg).  Last encounter: 05/30/2024 Last procedure: Visit date not found.  Reason for encounter: follow-up evaluation.  The patient patiens today for medication management discussion.  As per Dr. Marilyne note, she was referred to physical therapy for 6 weeks, and a lumbar facet block was ordered but denied by her insurance.  Review of her recent urine drug screening collected at our clinic revealed THC>1000.  I discussed with the patient that she must refrain from [facility] use to continue participation in pain management as per clinic protocol.  She admitted to ongoing [facility] use; therefore, I am unable to prescribe any controlled medications at this time.  The patient is referred again to physical therapy for continued conservative management. Pharmacotherapy Assessment   Monitoring: [place] PMP: PDMP not reviewed this encounter.       Pharmacotherapy: No side-effects or adverse reactions reported. Compliance: No problems identified. Effectiveness: Clinically acceptable.  Dominique Dorothe LABOR, RN  06/27/2024 11:29 AM  Sign when Signing Visit Safety precautions to be maintained throughout the outpatient stay will include: orient to surroundings, keep bed in low position, maintain call bell within reach at all times, provide assistance with transfer out of bed and ambulation.     UDS:  Summary  Date Value Ref Range Status  05/07/2024 FINAL  Final    Comment:    ====================================================================  Compliance Drug Analysis,  Ur ==================================================================== Test                             Result       Flag       Units  Drug Present and Declared for Prescription Verification   Cyclobenzaprine                 PRESENT      EXPECTED   Desmethylcyclobenzaprine       PRESENT      EXPECTED    Desmethylcyclobenzaprine is an expected metabolite of    cyclobenzaprine .    Diclofenac                     PRESENT      EXPECTED  Drug Present not Declared for Prescription Verification   Alprazolam                      166          UNEXPECTED ng/mg creat   Alpha-hydroxyalprazolam        389          UNEXPECTED ng/mg creat    Source of alprazolam  is a scheduled prescription medication. Alpha-    hydroxyalprazolam is an expected metabolite of alprazolam .    Carboxy-THC                    >1000        UNEXPECTED ng/mg creat    Carboxy-THC is a metabolite of tetrahydrocannabinol (THC). Source of    THC is most commonly herbal marijuana or marijuana-based products,    but THC is also present in a scheduled prescription medication.    Trace amounts of THC can be present in hemp and cannabidiol (CBD)    products. This test is not intended to distinguish between delta-9-    tetrahydrocannabinol, the predominant form of THC in most herbal or    marijuana-based products, and delta-8-tetrahydrocannabinol.    Acetaminophen                   PRESENT      UNEXPECTED ==================================================================== Test                      Result    Flag   Units      Ref Range   Creatinine              100              mg/dL      >=79 ==================================================================== Declared Medications:  The flagging and interpretation on this report are based on the  following declared medications.  Unexpected results may arise from  inaccuracies in the declared medications.   **Note: The testing scope of this panel includes these medications:    Cyclobenzaprine  (Flexeril )   **Note: The testing scope of this panel does not include small to  moderate amounts of these reported medications:   Diclofenac (Voltaren)   **Note: The testing scope of this panel does not include the  following reported medications:   Pantoprazole  (Protonix )  Rizatriptan (Maxalt) ==================================================================== For clinical consultation, please call [phone]. ====================================================================     No results found for: CBDTHCR No results found for: D8THCCBX No results found for: D9THCCBX  ROS  Constitutional: Denies any fever or chills Gastrointestinal: No reported hemesis, hematochezia, vomiting, or acute GI distress Musculoskeletal: Intractable  low back pain Neurological: No reported episodes of acute onset apraxia, aphasia, dysarthria, agnosia, amnesia, paralysis, loss of coordination, or loss of consciousness  Medication Review  ALPRAZolam , acetaminophen , cyclobenzaprine , diclofenac, magnesium gluconate, pantoprazole , and rizatriptan  History Review  Allergy: Dominique Dyer is allergic to aspirin , naproxen , sumatriptan succinate, and lactose intolerance (gi). Drug: Dominique Dyer  reports current drug use. Drug: Marijuana. Alcohol:  reports no history of alcohol use. Tobacco:  reports that she has been smoking cigars. She has never used smokeless tobacco. Social: Ms. Stenseth  reports that she has been smoking cigars. She has never used smokeless tobacco. She reports current drug use. Drug: Marijuana. She reports that she does not drink alcohol. Medical:  has a past medical history of Allergy, Anemia (2019), Anginal pain (2019), Anxiety, Arthritis, Bipolar disorder (HCC), Depression, Dyspnea, Dysrhythmia (2019), GERD (gastroesophageal reflux disease), Herniated lumbar intervertebral disc, Hypertension (2019), Migraines, Panic attacks, and Pica (2019). Surgical: Ms.  Dyer  has a past surgical history that includes Cesarean section; Colposcopy (2006); Cesarean section (Bilateral, 04/14/2018); and Tubal ligation (2019). Family: family history includes Anxiety disorder in her maternal grandmother and mother; Autism in her son; Bipolar disorder in her maternal grandmother; Cancer in her maternal grandfather and paternal uncle; Diabetes in her mother; Diabetic kidney disease in her paternal uncle; Heart attack in her paternal grandfather; Heart attack (age of onset: 30) in her mother; OCD in her maternal grandmother; Schizophrenia in her maternal grandmother; Seizures in her father; Thyroid  disease in her paternal grandmother.  Laboratory Chemistry Profile   Renal Lab Results  Component Value Date   BUN 14 05/07/2024   CREATININE 0.89 05/07/2024   BCR 16 05/07/2024   GFRAA >60 04/13/2018   GFRNONAA >60 03/09/2024    Hepatic Lab Results  Component Value Date   AST 39 05/07/2024   ALT 20 01/19/2024   ALBUMIN 4.4 05/07/2024   ALKPHOS 72 05/07/2024   LIPASE 41 01/19/2024    Electrolytes Lab Results  Component Value Date   NA 140 05/07/2024   K 4.6 05/07/2024   CL 103 05/07/2024   CALCIUM 9.7 05/07/2024   MG 2.1 05/07/2024    Bone Lab Results  Component Value Date   25OHVITD1 41 05/07/2024   25OHVITD2 36 05/07/2024   25OHVITD3 5.3 05/07/2024    Inflammation (CRP: Acute Phase) (ESR: Chronic Phase) Lab Results  Component Value Date   CRP 15 (H) 05/07/2024   ESRSEDRATE 25 05/07/2024         Note: Above Lab results reviewed.  Recent Imaging Review  DG HIP UNILAT W OR W/O PELVIS 2-3 VIEWS LEFT EXAM: XR SI joints Left hip and pelvis, 2 or more views 05/07/2024 04:32:00 PM  CLINICAL HISTORY: Left hip pain/arthralgia. Pt reports increasing pain in neck, lower back, hip and joints.  COMPARISON: None available.  TECHNIQUE: AP and oblique views of the SI joints. AP and lateral views of the left hip with frontal view of the  pelvis  FINDINGS: Pelvis and left hip: The hip joint space is preserved. Femoral head is well seated. No fracture, erosion or avascular necrosis. Minimal acetabular spurring. Bony pelvis is intact. 2 tubal ligation clips on the left. There is a right os acetabulum.  Sacroiliac joints: The sacroiliac joints are preserved. Mild subchondral cystic change and sclerosis. Minimal inferior spurring. No erosions.  IMPRESSION: 1. Mild degenerative change of the sacroiliac joints. 2. Minor degenerative change of the left hip.  Electronically signed by: Andrea Gasman MD 05/19/2024 09:32 PM EDT RP Workstation:  [id number] DG Si Joints EXAM: XR SI joints Left hip and pelvis, 2 or more views 05/07/2024 04:32:00 PM  CLINICAL HISTORY: Left hip pain/arthralgia. Pt reports increasing pain in neck, lower back, hip and joints.  COMPARISON: None available.  TECHNIQUE: AP and oblique views of the SI joints. AP and lateral views of the left hip with frontal view of the pelvis  FINDINGS: Pelvis and left hip: The hip joint space is preserved. Femoral head is well seated. No fracture, erosion or avascular necrosis. Minimal acetabular spurring. Bony pelvis is intact. 2 tubal ligation clips on the left. There is a right os acetabulum.  Sacroiliac joints: The sacroiliac joints are preserved. Mild subchondral cystic change and sclerosis. Minimal inferior spurring. No erosions.  IMPRESSION: 1. Mild degenerative change of the sacroiliac joints. 2. Minor degenerative change of the left hip.  Electronically signed by: Andrea Gasman MD 05/19/2024 09:32 PM EDT RP Workstation: [id number] DG Cervical Spine With Flex & Extend EXAM: 7 Views including Flexion and Extension XRAY OF THE CERVICAL SPINE 05/07/2024 04:32:00 PM  COMPARISON: None available.  CLINICAL HISTORY: Cervicalgia, chronic neck pain. Pt reports increasing pain in neck, lower back, hip and joints.  FINDINGS:  BONES: No acute  fracture. No aggressive appearing osseous lesion. Straightening of normal lordosis.  DISCS AND DEGENERATIVE CHANGES: Mild disc space narrowing and spurring at C6-C7. Anterior spurring at additional levels with preservation of disc spaces. No abnormal motion or evidence of instability. No bony neural foraminal narrowing. Mild facet hypertrophy at C2-C3 on the left.  SOFT TISSUES: No prevertebral soft tissue swelling. The visualized lungs appear clear.  IMPRESSION: 1. Mild degenerative disc disease greatest at C6-C7 2. Mild facet hypertrophy at C2-C3 on the left.  Electronically signed by: Andrea Gasman MD 05/19/2024 09:28 PM EDT RP Workstation: [id number] DG Lumbar Spine Complete W/Bend EXAM: 6 or more VIEW(S) XRAY OF THE LUMBAR SPINE 05/07/2024 04:32:00 PM  COMPARISON: None available.  CLINICAL HISTORY: Low back pain. Pt reports increasing pain in neck, lower back, hip and joints.  FINDINGS:  LUMBAR SPINE:  BONES: No acute fracture. No aggressive appearing osseous lesion. 5 non-rib-bearing lumbar vertebrae. Vertebral body heights are normal. No visible pars defects.  DISCS AND DEGENERATIVE CHANGES: Disk space narrowing and spurring at L4-L5 and L5-S1. Moderate L4-L5 with mild L5-S1 facet hypertrophy. 5 mm anterolisthesis of L4 on L5. No change in alignment on flexion or extension. No evidence of instability.  SOFT TISSUES: No acute abnormality.  IMPRESSION: 1. 5 mm anterolisthesis of L4 on L5 without instability on flexion or extension. 2. Lower lumbar degenerative disc disease and facet hypertrophy.  Electronically signed by: Andrea Gasman MD 05/19/2024 09:26 PM EDT RP Workstation: [id number] Note: Reviewed        Physical Exam  Vitals: BP 124/75   Pulse 90   Temp (!) 97.4 F (36.3 C)   Ht 5' 4 (1.626 m)   Wt 206 lb (93.4 kg)   SpO2 100%   BMI 35.36 kg/m  BMI: Estimated body mass index is 35.36 kg/m as calculated from the following:   Height as  of this encounter: 5' 4 (1.626 m).   Weight as of this encounter: 206 lb (93.4 kg). Ideal: Ideal body weight: 54.7 kg (120 lb 9.5 oz) Adjusted ideal body weight: 70.2 kg (154 lb 12.1 oz) General appearance: Well nourished, well developed, and well hydrated. In no apparent acute distress Mental status: Alert, oriented x 3 (person, place, & time)       Respiratory: No evidence  of acute respiratory distress Eyes: PERLA  Musculoskeletal: +LBP Assessment   Diagnosis Status  1. Intractable low back pain   2. Low back pain radiating to left leg   3. Cervicalgia   4. Chronic pain syndrome   5. Chronic neck pain    Controlled Controlled Controlled   Updated Problems: No problems updated.  Plan of Care  Problem-specific:  Assessment and Plan She admitted to ongoing [facility] use; therefore, I am unable to prescribe any controlled medications at this time.  The patient is referred again to physical therapy for continued conservative management.  Plan: Ambulatory referral to physical therapy  Ms. KADI HESSION has a current medication list which includes the following long-term medication(s): pantoprazole  and rizatriptan.  Pharmacotherapy (Medications Ordered): No orders of the defined types were placed in this encounter.  Orders:  Orders Placed This Encounter  Procedures   Ambulatory referral to Physical Therapy    Referral Priority:   Routine    Referral Type:   Physical Medicine    Referral Reason:   Specialty Services Required    Requested Specialty:   Physical Therapy    Number of Visits Requested:   1        Return for (F2F), eval by MD with Dr. Tanya .    Recent Visits Date Type Provider Dept  05/30/24 Office Visit Tanya Glisson, MD [facility]  05/07/24 Office Visit Tanya Glisson, MD [facility]  Showing recent visits within past 90 days and meeting all other requirements Today's Visits Date Type Provider Dept  06/27/24 Office  Visit Aliahna Statzer K, NP [facility]  Showing today's visits and meeting all other requirements Future Appointments Date Type Provider Dept  07/11/24 Appointment Tanya Glisson, MD [facility]  Showing future appointments within next 90 days and meeting all other requirements  I discussed the assessment and treatment plan with the patient. The patient was provided an opportunity to ask questions and all were answered. The patient agreed with the plan and demonstrated an understanding of the instructions.  Patient advised to call back or seek an in-person evaluation if the symptoms or condition worsens.  I personally spent a total of 20 minutes in the care of the patient today including preparing to see the patient, getting/reviewing separately obtained history, performing a medically appropriate exam/evaluation, counseling and educating, placing orders, referring and communicating with other health care professionals, documenting clinical information in the EHR, independently interpreting results, communicating results, and coordinating care.   Note by: Emmy MARLA Blanch, NP  Date: 06/27/2024; Time: 1:05 PM

## 2024-06-28 ENCOUNTER — Encounter: Payer: Self-pay | Admitting: Family

## 2024-06-28 DIAGNOSIS — R102 Pelvic and perineal pain unspecified side: Secondary | ICD-10-CM

## 2024-06-28 DIAGNOSIS — R339 Retention of urine, unspecified: Secondary | ICD-10-CM

## 2024-06-28 DIAGNOSIS — R350 Frequency of micturition: Secondary | ICD-10-CM

## 2024-06-29 ENCOUNTER — Other Ambulatory Visit: Payer: Self-pay | Admitting: Family

## 2024-06-29 DIAGNOSIS — R339 Retention of urine, unspecified: Secondary | ICD-10-CM

## 2024-06-29 DIAGNOSIS — R102 Pelvic and perineal pain unspecified side: Secondary | ICD-10-CM

## 2024-06-29 DIAGNOSIS — R350 Frequency of micturition: Secondary | ICD-10-CM

## 2024-07-02 ENCOUNTER — Emergency Department

## 2024-07-02 ENCOUNTER — Emergency Department
Admission: EM | Admit: 2024-07-02 | Discharge: 2024-07-02 | Disposition: A | Discharge status: Home / Self Care | Attending: Emergency Medicine | Admitting: Emergency Medicine

## 2024-07-02 ENCOUNTER — Other Ambulatory Visit: Payer: Self-pay

## 2024-07-02 DIAGNOSIS — I10 Essential (primary) hypertension: Secondary | ICD-10-CM | POA: Diagnosis not present

## 2024-07-02 DIAGNOSIS — R059 Cough, unspecified: Secondary | ICD-10-CM | POA: Diagnosis not present

## 2024-07-02 DIAGNOSIS — R079 Chest pain, unspecified: Secondary | ICD-10-CM

## 2024-07-02 DIAGNOSIS — R519 Headache, unspecified: Secondary | ICD-10-CM | POA: Insufficient documentation

## 2024-07-02 DIAGNOSIS — R110 Nausea: Secondary | ICD-10-CM | POA: Insufficient documentation

## 2024-07-02 DIAGNOSIS — R0981 Nasal congestion: Secondary | ICD-10-CM | POA: Diagnosis not present

## 2024-07-02 DIAGNOSIS — R0789 Other chest pain: Secondary | ICD-10-CM | POA: Diagnosis not present

## 2024-07-02 LAB — BASIC METABOLIC PANEL WITH GFR
Anion gap: 10 (ref 5–15)
BUN: 13 mg/dL (ref 6–20)
CO2: 26 mmol/L (ref 22–32)
Calcium: 9.5 mg/dL (ref 8.9–10.3)
Chloride: 103 mmol/L (ref 98–111)
Creatinine, Ser: 0.72 mg/dL (ref 0.44–1.00)
GFR, Estimated: >60 mL/min (ref >60)
Glucose, Bld: 122 mg/dL — ABNORMAL HIGH (ref 70–99)
Potassium: 4.1 mmol/L (ref 3.5–5.1)
Sodium: 139 mmol/L (ref 135–145)

## 2024-07-02 LAB — CBC
HCT: 41.1 % (ref 36.0–46.0)
Hemoglobin: 12.3 g/dL (ref 12.0–15.0)
MCH: 21.1 pg — ABNORMAL LOW (ref 26.0–34.0)
MCHC: 29.9 g/dL — ABNORMAL LOW (ref 30.0–36.0)
MCV: 70.5 fL — ABNORMAL LOW (ref 80.0–100.0)
Platelets: 446 K/uL — ABNORMAL HIGH (ref 150–400)
RBC: 5.83 MIL/uL — ABNORMAL HIGH (ref 3.87–5.11)
RDW: 14.3 % (ref 11.5–15.5)
WBC: 8.6 K/uL (ref 4.0–10.5)
nRBC: 0 % (ref 0.0–0.2)

## 2024-07-02 LAB — LIPASE, BLOOD: Lipase: 32 U/L (ref 11–51)

## 2024-07-02 LAB — RESP PANEL BY RT-PCR (RSV, FLU A&B, COVID)  RVPGX2
Influenza A by PCR: NEGATIVE
Influenza B by PCR: NEGATIVE
Resp Syncytial Virus by PCR: NEGATIVE
SARS Coronavirus 2 by RT PCR: NEGATIVE

## 2024-07-02 LAB — TROPONIN I (HIGH SENSITIVITY): Troponin I (High Sensitivity): 2 ng/L (ref <18)

## 2024-07-02 MED ORDER — PROCHLORPERAZINE EDISYLATE 10 MG/2ML IJ SOLN
10.0000 mg | Freq: Once | INTRAMUSCULAR | Status: AC
  Administered 2024-07-02: 10 mg via INTRAVENOUS
  Filled 2024-07-02: qty 2

## 2024-07-02 MED ORDER — DIPHENHYDRAMINE HCL 50 MG/ML IJ SOLN
25.0000 mg | Freq: Once | INTRAMUSCULAR | Status: AC
Start: 2024-07-02 — End: 2024-07-02
  Administered 2024-07-02: 25 mg via INTRAVENOUS
  Filled 2024-07-02: qty 1

## 2024-07-02 MED ORDER — SODIUM CHLORIDE 0.9 % IV BOLUS
1000.0000 mL | Freq: Once | INTRAVENOUS | Status: AC
  Administered 2024-07-02: 1000 mL via INTRAVENOUS

## 2024-07-02 MED ORDER — KETOROLAC TROMETHAMINE 15 MG/ML IJ SOLN
15.0000 mg | Freq: Once | INTRAMUSCULAR | Status: AC
  Administered 2024-07-02: 15 mg via INTRAVENOUS
  Filled 2024-07-02: qty 1
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
## 2024-07-02 NOTE — ED Triage Notes (Addendum)
 Pt to ED via POV from home. Pt reports yesterday had a panic attack on Saturday with chest pressure and SOB. Pt reports chest pressure and SOB is still present. Pt reports yesterday started to have cough, congestion, fever, N/V, chills and HA. Pt reports took migraine medication with no relief.

## 2024-07-02 NOTE — Discharge Instructions (Signed)
 You were seen in the Emergency Department today for evaluation of your chest pain and headache. Fortunately, your labs, EKG, and chest x-Caprice Wasko were overall reassuring against a emergency cause for your pain. Please follow-up with your primary doctor within the next few days for reevaluation. You can take Tylenol  and ibuprofen  as needed for your pain, unless there is another reason that you should not take these. Return to the ER for any new or worsening symptoms including worsening chest pain, difficulty breathing, new numbness, tingling, focal weakness, or any other new or concerning symptoms that you believe warrants immediate attention.

## 2024-07-03 ENCOUNTER — Other Ambulatory Visit: Payer: Self-pay | Admitting: Family

## 2024-07-03 DIAGNOSIS — R102 Pelvic and perineal pain unspecified side: Secondary | ICD-10-CM

## 2024-07-03 DIAGNOSIS — R350 Frequency of micturition: Secondary | ICD-10-CM

## 2024-07-03 DIAGNOSIS — R339 Retention of urine, unspecified: Secondary | ICD-10-CM

## 2024-07-04 ENCOUNTER — Ambulatory Visit
Admission: RE | Admit: 2024-07-04 | Discharge: 2024-07-04 | Disposition: A | Discharge status: Home / Self Care | Source: Ambulatory Visit | Attending: Family | Admitting: Family

## 2024-07-04 DIAGNOSIS — R102 Pelvic and perineal pain unspecified side: Secondary | ICD-10-CM | POA: Diagnosis present

## 2024-07-04 DIAGNOSIS — R339 Retention of urine, unspecified: Secondary | ICD-10-CM | POA: Diagnosis present

## 2024-07-04 DIAGNOSIS — R350 Frequency of micturition: Secondary | ICD-10-CM | POA: Diagnosis present

## 2024-07-07 ENCOUNTER — Encounter: Payer: Self-pay | Admitting: Pain Medicine

## 2024-07-09 NOTE — Progress Notes (Unsigned)
 PROVIDER NOTE: Interpretation of information contained herein should be left to medically-trained personnel. Specific patient instructions are provided elsewhere under Patient Instructions section of medical record. This document was created in part using AI and STT-dictation technology, any transcriptional errors that may result from this process are unintentional.  Patient: Dominique Dyer  Service: E/M   PCP: Eliverto Bette Hover, MD  DOB: 03/14/1981  DOS: 07/11/2024  Provider: Eric DELENA Como, MD  MRN: [id number]  Delivery: Face-to-face  Specialty: Interventional Pain Management  Type: Established Patient  Setting: Ambulatory outpatient facility  Specialty designation: 09  Referring Prov.: [facility] *  Location: Outpatient office facility       History of present illness (HPI) [name], a 43 y.o. year old female, is here today because of her No primary diagnosis found.. Ms. Warning primary complain today is No chief complaint on file.  Pertinent problems: 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); Intractable chronic migraine without aura and without status migrainosus; Rib pain; Chronic pain syndrome; Myofascial pain; Lumbar radiculopathy; Spondylolisthesis of lumbar region; Lumbar spondylosis; Myofascial pain syndrome of lumbar spine; Chronic low back pain (1ry area of Pain) (Bilateral) (L>R) w/o sciatica; Chronic lower extremity pain (2ry area of Pain) (Left); Chronic hip pain (Left); Chronic sacroiliac joint pain (Left); Lumbar facet joint pain (Bilateral); Lumbar facet joint syndrome; Abnormal NCS (nerve conduction studies) (UE); Chronic neck pain; Cervical facet joint pain (Bilateral) (L>R); Cervical spine crepitus; Cervicogenic headache (Bilateral) (L>R); Spondylosis without myelopathy or radiculopathy, cervical region; Spondylosis without myelopathy or radiculopathy, lumbar region; Osteoarthritis of  sacroiliac joints (HCC) (Bilateral); Lumbosacral facet hypertrophy; Cervical facet hypertrophy (Left: C2-3); Cervicalgia; Neck pain of over 3 months duration; Grade 1 (5 mm) Anterolisthesis of L4/L5 (stable); Low back pain of over 3 months duration; Low back pain radiating to left leg; Intractable low back pain; and Multifactorial low back pain on their pertinent problem list.  Pain Assessment: Severity of   is reported as a  /10. Location:    / . Onset:  . Quality:  . Timing:  . Modifying factor(s):  SABRA Vitals:  vitals were not taken for this visit.  BMI: Estimated body mass index is 35.34 kg/m as calculated from the following:   Height as of 07/02/24: 5' 4 (1.626 m).   Weight as of 07/02/24: 205 lb 14.6 oz (93.4 kg).  Last encounter: 05/30/2024. Last procedure: Visit date not found.  Reason for encounter: patient-requested evaluation.   Discussed the use of AI scribe software for clinical note transcription with the patient, who gave verbal consent to proceed.  History of Present Illness           Pharmacotherapy Assessment   Opioid Analgesic: None MME/day: 0 mg/day   Monitoring: [place] PMP: PDMP reviewed during this encounter.       Pharmacotherapy: No side-effects or adverse reactions reported. Compliance: No problems identified. Effectiveness: Clinically acceptable.  No notes on file  UDS:  Summary  Date Value Ref Range Status  05/07/2024 FINAL  Final    Comment:    ==================================================================== Compliance Drug Analysis, Ur ==================================================================== Test                             Result       Flag       Units  Drug Present and Declared for Prescription Verification   Cyclobenzaprine   PRESENT      EXPECTED   Desmethylcyclobenzaprine       PRESENT      EXPECTED    Desmethylcyclobenzaprine is an expected metabolite of    cyclobenzaprine .    Diclofenac                      PRESENT      EXPECTED  Drug Present not Declared for Prescription Verification   Alprazolam                      166          UNEXPECTED ng/mg creat   Alpha-hydroxyalprazolam        389          UNEXPECTED ng/mg creat    Source of alprazolam  is a scheduled prescription medication. Alpha-    hydroxyalprazolam is an expected metabolite of alprazolam .    Carboxy-THC                    >1000        UNEXPECTED ng/mg creat    Carboxy-THC is a metabolite of tetrahydrocannabinol (THC). Source of    THC is most commonly herbal marijuana or marijuana-based products,    but THC is also present in a scheduled prescription medication.    Trace amounts of THC can be present in hemp and cannabidiol (CBD)    products. This test is not intended to distinguish between delta-9-    tetrahydrocannabinol, the predominant form of THC in most herbal or    marijuana-based products, and delta-8-tetrahydrocannabinol.    Acetaminophen                   PRESENT      UNEXPECTED ==================================================================== Test                      Result    Flag   Units      Ref Range   Creatinine              100              mg/dL      >=79 ==================================================================== Declared Medications:  The flagging and interpretation on this report are based on the  following declared medications.  Unexpected results may arise from  inaccuracies in the declared medications.   **Note: The testing scope of this panel includes these medications:   Cyclobenzaprine  (Flexeril )   **Note: The testing scope of this panel does not include small to  moderate amounts of these reported medications:   Diclofenac (Voltaren)   **Note: The testing scope of this panel does not include the  following reported medications:   Pantoprazole  (Protonix )  Rizatriptan (Maxalt) ==================================================================== For clinical consultation, please  call [phone]. ====================================================================     No results found for: CBDTHCR No results found for: D8THCCBX No results found for: D9THCCBX  ROS  Constitutional: Denies any fever or chills Gastrointestinal: No reported hemesis, hematochezia, vomiting, or acute GI distress Musculoskeletal: Denies any acute onset joint swelling, redness, loss of ROM, or weakness Neurological: No reported episodes of acute onset apraxia, aphasia, dysarthria, agnosia, amnesia, paralysis, loss of coordination, or loss of consciousness  Medication Review  ALPRAZolam , acetaminophen , cyclobenzaprine , diclofenac, magnesium gluconate, pantoprazole , and rizatriptan  History Review  Allergy: Ms. Quizon is allergic to aspirin , naproxen , sumatriptan succinate, and lactose intolerance (gi). Drug: Ms. Calvillo  reports current drug use. Drug: Marijuana. Alcohol:  reports  no history of alcohol use. Tobacco:  reports that she has been smoking cigars. She has never used smokeless tobacco. Social: Ms. Basden  reports that she has been smoking cigars. She has never used smokeless tobacco. She reports current drug use. Drug: Marijuana. She reports that she does not drink alcohol. Medical:  has a past medical history of Allergy, Anemia (2019), Anginal pain (2019), Anxiety, Arthritis, Bipolar disorder (HCC), Depression, Dyspnea, Dysrhythmia (2019), GERD (gastroesophageal reflux disease), Herniated lumbar intervertebral disc, Hypertension (2019), Migraines, Panic attacks, and Pica (2019). Surgical: Ms. Emigh  has a past surgical history that includes Cesarean section; Colposcopy (2006); Cesarean section (Bilateral, 04/14/2018); and Tubal ligation (2019). Family: family history includes Anxiety disorder in her maternal grandmother and mother; Autism in her son; Bipolar disorder in her maternal grandmother; Cancer in her maternal grandfather and paternal uncle; Diabetes in her  mother; Diabetic kidney disease in her paternal uncle; Heart attack in her paternal grandfather; Heart attack (age of onset: 67) in her mother; OCD in her maternal grandmother; Schizophrenia in her maternal grandmother; Seizures in her father; Thyroid  disease in her paternal grandmother.  Laboratory Chemistry Profile   Renal Lab Results  Component Value Date   BUN 13 07/02/2024   CREATININE 0.72 07/02/2024   BCR 16 05/07/2024   GFRAA >60 04/13/2018   GFRNONAA >60 07/02/2024    Hepatic Lab Results  Component Value Date   AST 39 05/07/2024   ALT 20 01/19/2024   ALBUMIN 4.4 05/07/2024   ALKPHOS 72 05/07/2024   LIPASE 32 07/02/2024    Electrolytes Lab Results  Component Value Date   NA 139 07/02/2024   K 4.1 07/02/2024   CL 103 07/02/2024   CALCIUM 9.5 07/02/2024   MG 2.1 05/07/2024    Bone Lab Results  Component Value Date   25OHVITD1 41 05/07/2024   25OHVITD2 36 05/07/2024   25OHVITD3 5.3 05/07/2024    Inflammation (CRP: Acute Phase) (ESR: Chronic Phase) Lab Results  Component Value Date   CRP 15 (H) 05/07/2024   ESRSEDRATE 25 05/07/2024         Note: Above Lab results reviewed.  Recent Imaging Review  US  PELVIC COMPLETE WITH TRANSVAGINAL CLINICAL DATA:  Three-month history of pelvic pain. Urinary frequency and incomplete bladder emptying. History of tubal ligation.  EXAM: TRANSABDOMINAL AND TRANSVAGINAL ULTRASOUND OF PELVIS, ULTRASOUND EXAMINATION OF THE BLADDER  TECHNIQUE: Both transabdominal and transvaginal ultrasound examinations of the pelvis were performed. Transabdominal technique was performed for global imaging of the pelvis including uterus, ovaries, adnexal regions, and pelvic cul-de-sac and bladder. It was necessary to proceed with endovaginal exam following the transabdominal exam to visualize the ovaries and endometrium.  COMPARISON:  Prior CT scan from 2022  FINDINGS: Uterus  Measurements: 6.6 x 2.7 x 3.7 cm = volume: 33.5 mL. No  fibroids or other mass visualized.  Endometrium  Thickness: 5.6 mm.  No focal abnormality visualized.  Right ovary  Measurements: 1.0 x 0.8 x 2.1 cm = volume: 0.95 mL. Normal appearance/no adnexal mass.  Left ovary  Measurements: 0.8 x 0.7 x 1.6 cm = volume: 0.47 mL. Normal appearance/no adnexal mass.  Other findings  No abnormal free fluid.  Bladder:  The bladder is normal. No bladder wall thickening, bladder mass or bladder calculi. Bilateral ureteral jets are identified. No significant postvoid residual.  IMPRESSION: 1. Normal pelvic ultrasound examination. 2. Normal bladder ultrasound examination.  please select correct US  Pelvis template depending on combination of exams performed / being read (e.g. both, Doppler, etc).  Electronically Signed   By: MYRTIS Stammer M.D.   On: 07/04/2024 14:17 US  PELVIS LIMITED (TRANSABDOMINAL ONLY) CLINICAL DATA:  Three-month history of pelvic pain. Urinary frequency and incomplete bladder emptying. History of tubal ligation.  EXAM: TRANSABDOMINAL AND TRANSVAGINAL ULTRASOUND OF PELVIS, ULTRASOUND EXAMINATION OF THE BLADDER  TECHNIQUE: Both transabdominal and transvaginal ultrasound examinations of the pelvis were performed. Transabdominal technique was performed for global imaging of the pelvis including uterus, ovaries, adnexal regions, and pelvic cul-de-sac and bladder. It was necessary to proceed with endovaginal exam following the transabdominal exam to visualize the ovaries and endometrium.  COMPARISON:  Prior CT scan from 2022  FINDINGS: Uterus  Measurements: 6.6 x 2.7 x 3.7 cm = volume: 33.5 mL. No fibroids or other mass visualized.  Endometrium  Thickness: 5.6 mm.  No focal abnormality visualized.  Right ovary  Measurements: 1.0 x 0.8 x 2.1 cm = volume: 0.95 mL. Normal appearance/no adnexal mass.  Left ovary  Measurements: 0.8 x 0.7 x 1.6 cm = volume: 0.47 mL. Normal appearance/no adnexal  mass.  Other findings  No abnormal free fluid.  Bladder:  The bladder is normal. No bladder wall thickening, bladder mass or bladder calculi. Bilateral ureteral jets are identified. No significant postvoid residual.  IMPRESSION: 1. Normal pelvic ultrasound examination. 2. Normal bladder ultrasound examination.  please select correct US  Pelvis template depending on combination of exams performed / being read (e.g. both, Doppler, etc).  Electronically Signed   By: MYRTIS Stammer M.D.   On: 07/04/2024 14:17 Note: Reviewed        Physical Exam  Vitals: There were no vitals taken for this visit. BMI: Estimated body mass index is 35.34 kg/m as calculated from the following:   Height as of 07/02/24: 5' 4 (1.626 m).   Weight as of 07/02/24: 205 lb 14.6 oz (93.4 kg). Ideal: Ideal body weight: 54.7 kg (120 lb 9.5 oz) Adjusted ideal body weight: 70.2 kg (154 lb 11.5 oz) General appearance: Well nourished, well developed, and well hydrated. In no apparent acute distress Mental status: Alert, oriented x 3 (person, place, & time)       Respiratory: No evidence of acute respiratory distress Eyes: PERLA   Assessment   Diagnosis Status  No diagnosis found. Controlled Controlled Controlled   Updated Problems: No problems updated.  Plan of Care  Problem-specific:  Assessment and Plan            Ms. DRUSCILLA PETSCH has a current medication list which includes the following long-term medication(s): pantoprazole  and rizatriptan.  Pharmacotherapy (Medications Ordered): No orders of the defined types were placed in this encounter.  Orders:  No orders of the defined types were placed in this encounter.    Interventional Therapies  Risk Factors  Considerations  Medical Comorbidities:  (05/07/2024) UDS (+) Carboxy-THC (Marijuana)  GERD  HTN  tobacco dependence  Hx. Vasovagal Response to MNB/TPI by Dr. Maree     Planned  Pending:   Diagnostic bilateral lumbar facet  (L3-4, L4-5, and L5-S1) MBB #1    Under consideration:   Diagnostic Lumbar MRI  Diagnostic bilateral lumbar facet MBB #1 with possible RFA follow-up Diagnostic bilateral sacroiliac joint block #1 with possible RFA follow-up  Diagnostic bilateral cervical facet MBB #1 with possible RFA follow-up    Completed: (Analgesic benefit)1  None at this time   Therapeutic  Palliative (PRN) options:   None established   Completed by other providers:   Procedures done at [facility] & Dr. Maree [facility])  Diagnostic lumbar MRI (07/2023) apparently done at [facility] with no available report in our system Therapeutic MNB/TPI x1 (03/28/2024) by Dr. Jannett Fairly [facility]) (Vasovagal RXN)  Therapeutic right CTS inj. x2 (12/10/2019, 01/21/2020) by Dr. Jannett Fairly [facility])  Diagnostic bilateral (UE) EMG (11/19/2019) by Dr. Jannett Fairly [facility]) Dx: (R) Grade 1 CTS. Therapeutic occipital NB + TPI x1 (04/18/2019) by Dr. Jannett Fairly [facility])  Therapeutic sphenopalatine (V2 trigeminal NB) x2 (03/30/2017, 04/06/2017) by Dr. Jannett Fairly [facility])  Therapeutic left L4/L5 TFESI x1 (December 2024) by unknown provider.  [place])  Therapeutic bilateral L4/L5 TFESI x2 (10/26/2023, 01/04/2024) by unknown provider.  ([facility])  Therapeutic L5-S1 LESI x1 (01/04/2024) by unknown provider.  ([facility])  Diagnostic left L4-5, L5-S1 lumbar facet BLK x2 (02/17/2024, 03/14/2024) by unknown provider.  ([facility])  Therapeutic left L4-5, L5-S1 lumbar facet MB RFA x1 (04/11/2024) by unknown provider.  ([facility])   1(Analgesic benefit): Expressed in percentage (%). (Local anesthetic[LA] +/- sedation  L.A.Local Anesthetic  Steroid benefit  Ongoing benefit)     No follow-ups on file.    Recent Visits Date Type Provider Dept  06/27/24 Office Visit Patel, Seema K, NP [facility]  05/30/24 Office Visit Tanya Glisson, MD [facility]  05/07/24 Office Visit Tanya Glisson, MD [facility]  Showing recent visits within past 90 days and meeting all other requirements Future Appointments Date Type Provider Dept  07/11/24 Appointment Tanya Glisson, MD [facility]  Showing future appointments within next 90 days and meeting all other requirements  I discussed the assessment and treatment plan with the patient. The patient was provided an opportunity to ask questions and all were answered. The patient agreed with the plan and demonstrated an understanding of the instructions.  Patient advised to call back or seek an in-person evaluation if the symptoms or condition worsens.  Duration of encounter: *** minutes.  Total time on encounter, as per AMA guidelines included both the face-to-face and non-face-to-face time personally spent by the physician and/or other qualified health care professional(s) on the day of the encounter (includes time in activities that require the physician or other qualified health care professional and does not include time in activities normally performed by clinical staff). Physician's time may include the following activities when performed: Preparing to see the patient (e.g., pre-charting review of records, searching for previously ordered imaging, lab work, and nerve conduction tests) Review of prior analgesic pharmacotherapies. Reviewing PMP Interpreting ordered tests (e.g., lab work, imaging, nerve conduction tests) Performing post-procedure evaluations, including interpretation of diagnostic procedures Obtaining and/or reviewing separately obtained history Performing a medically appropriate examination and/or evaluation Counseling and educating the patient/family/caregiver Ordering medications, tests, or procedures Referring and communicating with other health care professionals (when not separately reported) Documenting clinical information in the electronic or other health record Independently  interpreting results (not separately reported) and communicating results to the patient/ family/caregiver Care coordination (not separately reported)  Note by: Glisson DELENA Tanya, MD (TTS and AI technology used. I apologize for any typographical errors that were not detected and corrected.) Date: 07/11/2024; Time: 7:13 PM

## 2024-07-11 ENCOUNTER — Encounter: Payer: Self-pay | Admitting: Pain Medicine

## 2024-07-11 ENCOUNTER — Ambulatory Visit: Attending: Pain Medicine | Admitting: Pain Medicine

## 2024-07-11 VITALS — BP 111/75 | HR 83 | Temp 97.9°F | Resp 14 | Ht 64.0 in | Wt 208.0 lb

## 2024-07-11 DIAGNOSIS — M5459 Other low back pain: Secondary | ICD-10-CM | POA: Insufficient documentation

## 2024-07-11 DIAGNOSIS — R350 Frequency of micturition: Secondary | ICD-10-CM | POA: Insufficient documentation

## 2024-07-11 DIAGNOSIS — M47816 Spondylosis without myelopathy or radiculopathy, lumbar region: Secondary | ICD-10-CM | POA: Diagnosis present

## 2024-07-11 DIAGNOSIS — G8929 Other chronic pain: Secondary | ICD-10-CM | POA: Diagnosis present

## 2024-07-11 DIAGNOSIS — R339 Retention of urine, unspecified: Secondary | ICD-10-CM | POA: Insufficient documentation

## 2024-07-11 DIAGNOSIS — R102 Pelvic and perineal pain unspecified side: Secondary | ICD-10-CM | POA: Diagnosis present

## 2024-07-11 DIAGNOSIS — M5134 Other intervertebral disc degeneration, thoracic region: Secondary | ICD-10-CM | POA: Insufficient documentation

## 2024-07-11 DIAGNOSIS — M545 Low back pain, unspecified: Secondary | ICD-10-CM | POA: Insufficient documentation

## 2024-07-11 DIAGNOSIS — M4316 Spondylolisthesis, lumbar region: Secondary | ICD-10-CM | POA: Diagnosis present

## 2024-07-11 DIAGNOSIS — M47817 Spondylosis without myelopathy or radiculopathy, lumbosacral region: Secondary | ICD-10-CM | POA: Insufficient documentation

## 2024-07-11 DIAGNOSIS — R7982 Elevated C-reactive protein (CRP): Secondary | ICD-10-CM | POA: Diagnosis present

## 2024-07-11 NOTE — Patient Instructions (Signed)
 ______________________________________________________________________    Procedure instructions  Stop blood-thinners  Do not eat or drink fluids (other than water ) for 6 hours before your procedure  No water  for 2 hours before your procedure  Take your blood pressure medicine with a sip of water   Arrive 30 minutes before your appointment  If sedation is planned, bring suitable driver. [place], [place], & public transportation are NOT APPROVED)  Carefully read the Preparing for your procedure detailed instructions  If you have questions call us  at [phone]  Procedure appointments are for procedures only.   NO medication refills or new problem evaluations will be done on procedure days.   Only the scheduled, pre-approved procedure and side will be done.   ______________________________________________________________________     ______________________________________________________________________    Preparing for your procedure  Appointments: If you think you may not be able to keep your appointment, call 24-48 hours in advance to cancel. We need time to make it available to others.  Procedure visits are for procedures only. During your procedure appointment there will be: NO Prescription Refills*. NO medication changes or discussions*. NO discussion of disability issues*. NO unrelated pain problem evaluations*. NO evaluations to order other pain procedures*. *These will be addressed at a separate and distinct evaluation encounter on the provider's evaluation schedule and not during procedure days.  Instructions: Food intake: Avoid eating anything solid for at least 8 hours prior to your procedure. Clear liquid intake: You may take clear liquids such as water  up to 2 hours prior to your procedure. (No carbonated drinks. No soda.) Transportation: Unless otherwise stated by your physician, bring a driver. (Driver cannot be a Market researcher, Pharmacist, community, or any other form of public  transportation.) Morning Medicines: Except for blood thinners, take all of your other morning medications with a sip of water . Make sure to take your heart and blood pressure medicines. If your blood pressure's lower number is above 100, the case will be rescheduled. Blood thinners: Make sure to stop your blood thinners as instructed.  If you take a blood thinner, but were not instructed to stop it, call our office [phone] and ask to talk to a nurse. Not stopping a blood thinner prior to certain procedures could lead to serious complications. Diabetics on insulin : Notify the staff so that you can be scheduled 1st case in the morning. If your diabetes requires high dose insulin , take only  of your normal insulin  dose the morning of the procedure and notify the staff that you have done so. Preventing infections: Shower with an antibacterial soap the morning of your procedure.  Build-up your immune system: Take 1000 mg of Vitamin C with every meal (3 times a day) the day prior to your procedure. Antibiotics: Inform the nursing staff if you are taking any antibiotics or if you have any conditions that may require antibiotics prior to procedures. (Example: recent joint implants)   Pregnancy: If you are pregnant make sure to notify the nursing staff. Not doing so may result in injury to the fetus, including death.  Sickness: If you have a cold, fever, or any active infections, call and cancel or reschedule your procedure. Receiving steroids while having an infection may result in complications. Arrival: You must be in the facility at least 30 minutes prior to your scheduled procedure. Tardiness: Your scheduled time is also the cutoff time. If you do not arrive at least 15 minutes prior to your procedure, you will be rescheduled.  Children: Do not bring any children with  you. Make arrangements to keep them home. Dress appropriately: There is always a possibility that your clothing may get soiled. Avoid  long dresses. Valuables: Do not bring any jewelry or valuables.  Reasons to call and reschedule or cancel your procedure: (Following these recommendations will minimize the risk of a serious complication.) Surgeries: Avoid having procedures within 2 weeks of any surgery. (Avoid for 2 weeks before or after any surgery). Flu Shots: Avoid having procedures within 2 weeks of a flu shots or . (Avoid for 2 weeks before or after immunizations). Barium: Avoid having a procedure within 7-10 days after having had a radiological study involving the use of radiological contrast. (Myelograms, Barium swallow or enema study). Heart attacks: Avoid any elective procedures or surgeries for the initial 6 months after a Myocardial Infarction (Heart Attack). Blood thinners: It is imperative that you stop these medications before procedures. Let us  know if you if you take any blood thinner.  Infection: Avoid procedures during or within two weeks of an infection (including chest colds or gastrointestinal problems). Symptoms associated with infections include: Localized redness, fever, chills, night sweats or profuse sweating, burning sensation when voiding, cough, congestion, stuffiness, runny nose, sore throat, diarrhea, nausea, vomiting, cold or Flu symptoms, recent or current infections. It is specially important if the infection is over the area that we intend to treat. Heart and lung problems: Symptoms that may suggest an active cardiopulmonary problem include: cough, chest pain, breathing difficulties or shortness of breath, dizziness, ankle swelling, uncontrolled high or unusually low blood pressure, and/or palpitations. If you are experiencing any of these symptoms, cancel your procedure and contact your primary care physician for an evaluation.  Remember:  Regular Business hours are:  Monday to Thursday 8:00 AM to 4:00 PM  Provider's Schedule: Eric Como, MD:  Procedure days: Tuesday and Thursday 7:30  AM to 4:00 PM  Wallie Sherry, MD:  Procedure days: Monday and Wednesday 7:30 AM to 4:00 PM Last  Updated: 08/30/2023 ______________________________________________________________________     ______________________________________________________________________    General Risks and Possible Complications  Patient Responsibilities: It is important that you read this as it is part of your informed consent. It is our duty to inform you of the risks and possible complications associated with treatments offered to you. It is your responsibility as a patient to read this and to ask questions about anything that is not clear or that you believe was not covered in this document.  Patient's Rights: You have the right to refuse treatment. You also have the right to change your mind, even after initially having agreed to have the treatment done. However, under this last option, if you wait until the last second to change your mind, you may be charged for the materials used up to that point.  Introduction: Medicine is not an Visual merchandiser. Everything in Medicine, including the lack of treatment(s), carries the potential for danger, harm, or loss (which is by definition: Risk). In Medicine, a complication is a secondary problem, condition, or disease that can aggravate an already existing one. All treatments carry the risk of possible complications. The fact that a side effects or complications occurs, does not imply that the treatment was conducted incorrectly. It must be clearly understood that these can happen even when everything is done following the highest safety standards.  No treatment: You can choose not to proceed with the proposed treatment alternative. The "PRO(s)" would include: avoiding the risk of complications associated with the therapy. The "CON(s)" would include:  not getting any of the treatment benefits. These benefits fall under one of three categories: diagnostic; therapeutic; and/or  palliative. Diagnostic benefits include: getting information which can ultimately lead to improvement of the disease or symptom(s). Therapeutic benefits are those associated with the successful treatment of the disease. Finally, palliative benefits are those related to the decrease of the primary symptoms, without necessarily curing the condition (example: decreasing the pain from a flare-up of a chronic condition, such as incurable terminal cancer).  General Risks and Complications: These are associated to most interventional treatments. They can occur alone, or in combination. They fall under one of the following six (6) categories: no benefit or worsening of symptoms; bleeding; infection; nerve damage; allergic reactions; and/or death. No benefits or worsening of symptoms: In Medicine there are no guarantees, only probabilities. No healthcare provider can ever guarantee that a medical treatment will work, they can only state the probability that it may. Furthermore, there is always the possibility that the condition may worsen, either directly, or indirectly, as a consequence of the treatment. Bleeding: This is more common if the patient is taking a blood thinner, either prescription or over the counter (example: Goody Powders, Fish oil, Aspirin, Garlic, etc.), or if suffering a condition associated with impaired coagulation (example: Hemophilia, cirrhosis of the liver, low platelet counts, etc.). However, even if you do not have one on these, it can still happen. If you have any of these conditions, or take one of these drugs, make sure to notify your treating physician. Infection: This is more common in patients with a compromised immune system, either due to disease (example: diabetes, cancer, human immunodeficiency virus [HIV], etc.), or due to medications or treatments (example: therapies used to treat cancer and rheumatological diseases). However, even if you do not have one on these, it can still  happen. If you have any of these conditions, or take one of these drugs, make sure to notify your treating physician. Nerve Damage: This is more common when the treatment is an invasive one, but it can also happen with the use of medications, such as those used in the treatment of cancer. The damage can occur to small secondary nerves, or to large primary ones, such as those in the spinal cord and brain. This damage may be temporary or permanent and it may lead to impairments that can range from temporary numbness to permanent paralysis and/or brain death. Allergic Reactions: Any time a substance or material comes in contact with our body, there is the possibility of an allergic reaction. These can range from a mild skin rash (contact dermatitis) to a severe systemic reaction (anaphylactic reaction), which can result in death. Death: In general, any medical intervention can result in death, most of the time due to an unforeseen complication. ______________________________________________________________________      ______________________________________________________________________    Steroid injections  Common steroids for injections Triamcinolone: Used by many sports medicine physicians for large joint and bursal injections, often combined with a local anesthetic like lidocaine . A study focusing on coccydynia (tailbone pain) found triamcinolone was more effective than betamethasone , suggesting it may also be preferable for other localized inflammation conditions. Methylprednisolone: A common alternative to triamcinolone that is also a strong anti-inflammatory. It is available in different formulations, with the acetate suspension being the long-acting option for intra-articular injections. Dexamethasone : This is a non-particulate steroid, meaning it has a lower risk of tissue damage compared to particulate steroids like triamcinolone and methylprednisolone. While less common for this specific  use,  it is an option for targeted injections.   Considerations for physicians Particulate vs. non-particulate steroids: Triamcinolone and methylprednisolone are particulate, meaning they can clump together. Dexamethasone  is non-particulate. Particulate steroids are often preferred for their longer-lasting effects but carry a theoretical higher risk for certain injections (though this is less of a concern in the costochondral joints). Combined injectate: Corticosteroids are typically mixed with a local anesthetic like lidocaine  to provide both immediate pain relief (from the anesthetic) and longer-term inflammation reduction (from the steroid). Imaging guidance: To ensure accurate placement of the needle and medication, physicians may use ultrasound or fluoroscopic guidance for the injection, especially in complex or refractory cases.   Patient guidance Before undergoing a steroid injection, discuss the options with your physician. They will determine the best steroid, dosage, and procedure for your specific case based on factors like: Severity of your condition History of response to other treatments Your overall health status Experience and preference of the physician  Last  Updated: 05/15/2024 ______________________________________________________________________

## 2024-07-11 NOTE — Progress Notes (Signed)
 Safety precautions to be maintained throughout the outpatient stay will include: orient to surroundings, keep bed in low position, maintain call bell within reach at all times, provide assistance with transfer out of bed and ambulation.

## 2024-07-25 NOTE — Progress Notes (Unsigned)
 PROVIDER NOTE: Interpretation of information contained herein should be left to medically-trained personnel. Specific patient instructions are provided elsewhere under Patient Instructions section of medical record. This document was created in part using STT-dictation technology, any transcriptional errors that may result from this process are unintentional.  Patient: Dominique Dyer Type: Established DOB: December 09, 1980 MRN: [id number] PCP: Eliverto Bette Hover, MD  Service: Procedure DOS: 07/26/2024 Setting: Ambulatory Location: Ambulatory outpatient facility Delivery: Face-to-face Provider: Eric DELENA Como, MD Specialty: Interventional Pain Management Specialty designation: 09 Location: Outpatient facility Ref. Prov.: Como Eric, MD       Interventional Therapy   Type: Lumbar epidural steroid injection (LESI) (interlaminar) #1    Laterality: Right   Level:  L5-S1 Level.  Imaging: Fluoroscopic guidance Spinal ([id number]) Anesthesia: Local anesthesia (1-2% Lidocaine ) Anxiolysis: IV Versed 2.0 mg + Glycopyrrolate 0.2 mg IV Sedation: Minimal Sedation Fentanyl  1 mL (50 mcg) DOS: 07/26/2024  Performed by: Eric DELENA Como, MD  Purpose: Diagnostic/Therapeutic Indications: Lumbar radicular pain of intraspinal etiology of more than 4 weeks that has failed to respond to conservative therapy and is severe enough to impact quality of life or function. 1. Chronic low back pain (1ry area of Pain) (Bilateral) (L>R) w/o sciatica   2. Chronic lower extremity pain (2ry area of Pain) (Left)   3. Grade 1 (5 mm) Anterolisthesis of L4/L5 (stable)   4. Low back pain of over 3 months duration   5. Lumbar facet joint pain (Bilateral)   6. History of Vasovagal episode    NAS-11 Pain score:   Pre-procedure: 6 /10   Post-procedure: 0-No pain/10      Position / Prep / Materials:  Position: Prone w/ head of the table raised (slight reverse trendelenburg) to facilitate breathing.  Prep  solution: ChloraPrep (2% chlorhexidine gluconate and 70% isopropyl alcohol) Prep Area: Entire Posterior Lumbar Region from lower scapular tip down to mid buttocks area and from flank to flank. Materials:  Tray: Epidural tray Needle(s):  Type: Epidural needle (Tuohy) Gauge (G):  17 Length: Regular (3.5-in) Qty: 1  H&P (Pre-op Assessment):  Dominique Dyer is a 43 y.o. (year old), female patient, seen today for interventional treatment. She  has a past surgical history that includes Cesarean section; Colposcopy (2006); Cesarean section (Bilateral, 04/14/2018); and Tubal ligation (2019). Dominique Dyer has a current medication list which includes the following prescription(s): acetaminophen , alprazolam , cyclobenzaprine , diclofenac, magnesium gluconate, pantoprazole , and rizatriptan, and the following Facility-Administered Medications: fentanyl . Her primarily concern today is the Back Pain (lower)  Initial Vital Signs:  Pulse/HCG Rate: 82ECG Heart Rate: 74 (nsr) Temp: 97.8 F (36.6 C) Resp: 18 BP: 114/78 SpO2: 100 %  BMI: Estimated body mass index is 36.73 kg/m as calculated from the following:   Height as of this encounter: 5' 4 (1.626 m).   Weight as of this encounter: 214 lb (97.1 kg).  Risk Assessment: Allergies: Reviewed. She is allergic to aspirin , naproxen , sumatriptan succinate, and lactose intolerance (gi).  Allergy Precautions: None required Coagulopathies: Reviewed. None identified.  Blood-thinner therapy: None at this time Active Infection(s): Reviewed. None identified. Dominique Dyer is afebrile  Site Confirmation: Dominique Dyer was asked to confirm the procedure and laterality before marking the site Procedure checklist: Completed Consent: Before the procedure and under the influence of no sedative(s), amnesic(s), or anxiolytics, the patient was informed of the treatment options, risks and possible complications. To fulfill our ethical and legal obligations, as recommended by the  American Medical Association's Code of Ethics, I have informed the patient of  my clinical impression; the nature and purpose of the treatment or procedure; the risks, benefits, and possible complications of the intervention; the alternatives, including doing nothing; the risk(s) and benefit(s) of the alternative treatment(s) or procedure(s); and the risk(s) and benefit(s) of doing nothing. The patient was provided information about the general risks and possible complications associated with the procedure. These may include, but are not limited to: failure to achieve desired goals, infection, bleeding, organ or nerve damage, allergic reactions, paralysis, and death. In addition, the patient was informed of those risks and complications associated to Spine-related procedures, such as failure to decrease pain; infection (i.e.: Meningitis, epidural or intraspinal abscess); bleeding (i.e.: epidural hematoma, subarachnoid hemorrhage, or any other type of intraspinal or peri-dural bleeding); organ or nerve damage (i.e.: Any type of peripheral nerve, nerve root, or spinal cord injury) with subsequent damage to sensory, motor, and/or autonomic systems, resulting in permanent pain, numbness, and/or weakness of one or several areas of the body; allergic reactions; (i.e.: anaphylactic reaction); and/or death. Furthermore, the patient was informed of those risks and complications associated with the medications. These include, but are not limited to: allergic reactions (i.e.: anaphylactic or anaphylactoid reaction(s)); adrenal axis suppression; blood sugar elevation that in diabetics may result in ketoacidosis or comma; water retention that in patients with history of congestive heart failure may result in shortness of breath, pulmonary edema, and decompensation with resultant heart failure; weight gain; swelling or edema; medication-induced neural toxicity; particulate matter embolism and blood vessel occlusion with  resultant organ, and/or nervous system infarction; and/or aseptic necrosis of one or more joints. Finally, the patient was informed that Medicine is not an exact science; therefore, there is also the possibility of unforeseen or unpredictable risks and/or possible complications that may result in a catastrophic outcome. The patient indicated having understood very clearly. We have given the patient no guarantees and we have made no promises. Enough time was given to the patient to ask questions, all of which were answered to the patient's satisfaction. Ms. Hobart has indicated that she wanted to continue with the procedure. Attestation: I, the ordering provider, attest that I have discussed with the patient the benefits, risks, side-effects, alternatives, likelihood of achieving goals, and potential problems during recovery for the procedure that I have provided informed consent. Date  Time: 07/26/2024  9:09 AM  Pre-Procedure Preparation:  Monitoring: As per clinic protocol. Respiration, ETCO2, SpO2, BP, heart rate and rhythm monitor placed and checked for adequate function Safety Precautions: Patient was assessed for positional comfort and pressure points before starting the procedure. Time-out: I initiated and conducted the Time-out before starting the procedure, as per protocol. The patient was asked to participate by confirming the accuracy of the Time Out information. Verification of the correct person, site, and procedure were performed and confirmed by me, the nursing staff, and the patient. Time-out conducted as per Joint Commission's Universal Protocol (UP.01.01.01). Time: 1030 Start Time: 1030 hrs.  Description/Narrative of Procedure:          Target: Epidural space via interlaminar opening, initially targeting the lower laminar border of the superior vertebral body. Region: Lumbar Approach: Percutaneous paravertebral  Rationale (medical necessity): procedure needed and proper for  the diagnosis and/or treatment of the patient's medical symptoms and needs. Procedural Technique Safety Precautions: Aspiration looking for blood return was conducted prior to all injections. At no point did we inject any substances, as a needle was being advanced. No attempts were made at seeking any paresthesias. Safe injection practices and  needle disposal techniques used. Medications properly checked for expiration dates. SDV (single dose vial) medications used. Description of the Procedure: Protocol guidelines were followed. The procedure needle was introduced through the skin, ipsilateral to the reported pain, and advanced to the target area. Bone was contacted and the needle walked caudad, until the lamina was cleared. The epidural space was identified using "loss-of-resistance technique" with 2-3 ml of PF-NaCl (0.9% NSS), in a 5cc LOR glass syringe.  Vitals:   07/26/24 0906 07/26/24 1030 07/26/24 1037  BP: 114/78 108/75 110/83  Pulse: 82    Resp: 18 18 17   Temp: 97.8 F (36.6 C)    TempSrc: Temporal    SpO2: 100% 100% 100%  Weight: 214 lb (97.1 kg)    Height: 5' 4 (1.626 m)      Start Time: 1030 hrs. End Time: 1037 hrs.  Imaging Guidance (Spinal):          Type of Imaging Technique: Fluoroscopy Guidance (Spinal) Indication(s): Fluoroscopy guidance for needle placement to enhance accuracy in procedures requiring precise needle localization for targeted delivery of medication in or near specific anatomical locations not easily accessible without such real-time imaging assistance. Exposure Time: Please see nurses notes. Contrast: Before injecting any contrast, we confirmed that the patient did not have an allergy to iodine, shellfish, or radiological contrast. Once satisfactory needle placement was completed at the desired level, radiological contrast was injected. Contrast injected under live fluoroscopy. No contrast complications. See chart for type and volume of contrast  used. Fluoroscopic Guidance: I was personally present during the use of fluoroscopy. Tunnel Vision Technique used to obtain the best possible view of the target area. Parallax error corrected before commencing the procedure. Direction-depth-direction technique used to introduce the needle under continuous pulsed fluoroscopy. Once target was reached, antero-posterior, oblique, and lateral fluoroscopic projection used confirm needle placement in all planes. Images permanently stored in EMR. Interpretation: I personally interpreted the imaging intraoperatively. Adequate needle placement confirmed in multiple planes. Appropriate spread of contrast into desired area was observed. No evidence of afferent or efferent intravascular uptake. No intrathecal or subarachnoid spread observed. Permanent images saved into the patient's record.  Antibiotic Prophylaxis:   Anti-infectives (From admission, onward)    None      Indication(s): None identified  Post-operative Assessment:  Post-procedure Vital Signs:  Pulse/HCG Rate: 8275 (nsr) Temp: 97.8 F (36.6 C) Resp: 17 BP: 110/83 SpO2: 100 %  EBL: None  Complications: No immediate post-treatment complications observed by team, or reported by patient.  Note: The patient tolerated the entire procedure well. A repeat set of vitals were taken after the procedure and the patient was kept under observation following institutional policy, for this type of procedure. Post-procedural neurological assessment was performed, showing return to baseline, prior to discharge. The patient was provided with post-procedure discharge instructions, including a section on how to identify potential problems. Should any problems arise concerning this procedure, the patient was given instructions to immediately contact us , at any time, without hesitation. In any case, we plan to contact the patient by telephone for a follow-up status report regarding this interventional  procedure.  Comments:  No additional relevant information.  Plan of Care (POC)  Orders:  Orders Placed This Encounter  Procedures   Lumbar Epidural Injection    Scheduling Instructions:     Procedure: Interlaminar LESI L5-S1     Laterality: Right     Sedation: With Sedation     Date: 07/26/2024    Where will this procedure  be performed?:   [facility]   DG [facility] C-ARM 1-60 MIN NO REPORT    Intraoperative interpretation by procedural physician at [facility].    Standing Status:   Standing    Number of Occurrences:   1    Reason for exam::   Assistance in needle guidance and placement for procedures requiring needle placement in or near specific anatomical locations not easily accessible without such assistance.   Informed Consent Details: Physician/Practitioner Attestation; Transcribe to consent form and obtain patient signature    Note: Always confirm laterality of pain with Ms. Katie, before procedure. Transcribe to consent form and obtain patient signature.    Physician/Practitioner attestation of informed consent for procedure/surgical case:   I, the physician/practitioner, attest that I have discussed with the patient the benefits, risks, side effects, alternatives, likelihood of achieving goals and potential problems during recovery for the procedure that I have provided informed consent.    Procedure:   Lumbar epidural steroid injection under fluoroscopic guidance    Physician/Practitioner performing the procedure:   Shan Valdes A. Tanya, MD    Indication/Reason:   Low back and/or lower extremity pain secondary to lumbar radiculitis   Provide equipment / supplies at bedside    Procedural tray: Epidural Tray (Disposable  single use) Skin infiltration needle: Regular 1.5-in, 25-G, (x1) Block needle size: Regular standard Catheter: No catheter required    Standing Status:   Standing    Number of Occurrences:   1    Specify:   Epidural Tray   Saline  lock IV    Have LR [phone] mL available and administer at 125 mL/hr if patient becomes hypotensive.    Standing Status:   Standing    Number of Occurrences:   1     Opioid Analgesic: None MME/day: 0 mg/day    Medications ordered for procedure: Meds ordered this encounter  Medications   iohexol  (OMNIPAQUE ) 180 MG/ML injection 10 mL    Must be Myelogram-compatible. If not available, you may substitute with a water-soluble, non-ionic, hypoallergenic, myelogram-compatible radiological contrast medium.   lidocaine  (XYLOCAINE ) 2 % (with pres) injection 400 mg   pentafluoroprop-tetrafluoroeth (GEBAUERS) aerosol   glycopyrrolate (ROBINUL) injection 0.2 mg   midazolam (VERSED) 5 MG/5ML injection 0.5-2 mg    Make sure Flumazenil is available in the pyxis when using this medication. If oversedation occurs, administer 0.2 mg IV over 15 sec. If after 45 sec no response, administer 0.2 mg again over 1 min; may repeat at 1 min intervals; not to exceed 4 doses (1 mg)   fentaNYL  (SUBLIMAZE ) injection 25-50 mcg    Make sure Narcan  is available in the pyxis when using this medication. In the event of respiratory depression (RR< 8/min): Titrate NARCAN  (naloxone ) in increments of 0.1 to 0.2 mg IV at 2-3 minute intervals, until desired degree of reversal.   sodium chloride  flush (NS) 0.9 % injection 2 mL   ropivacaine (PF) 2 mg/mL (0.2%) (NAROPIN) injection 2 mL   triamcinolone acetonide (KENALOG-40) injection 40 mg   Medications administered: We administered iohexol , lidocaine , pentafluoroprop-tetrafluoroeth, glycopyrrolate, midazolam, fentaNYL , sodium chloride  flush, ropivacaine (PF) 2 mg/mL (0.2%), and triamcinolone acetonide.  See the medical record for exact dosing, route, and time of administration.    Interventional Therapies  Risk Factors  Considerations  Medical Comorbidities:  (05/07/2024) UDS (+) Carboxy-THC (Marijuana)  GERD  HTN  tobacco dependence  Hx. Vasovagal Response to MNB/TPI  by Dr. Maree     Planned  Pending:  Diagnostic bilateral lumbar facet (L3-4, L4-5, and L5-S1) MBB #1    Under consideration:   Diagnostic Lumbar MRI  Diagnostic bilateral lumbar facet MBB #1 with possible RFA follow-up Diagnostic bilateral sacroiliac joint block #1 with possible RFA follow-up  Diagnostic bilateral cervical facet MBB #1 with possible RFA follow-up    Completed: (Analgesic benefit)1  None at this time   Therapeutic  Palliative (PRN) options:   None established   Completed by other providers:   Procedures done at [facility] & Dr. Maree [facility])  Diagnostic lumbar MRI (07/2023) apparently done at [facility] with no available report in our system Therapeutic MNB/TPI x1 (03/28/2024) by Dr. Jannett Maree [facility]) (Vasovagal RXN)  Therapeutic right CTS inj. x2 (12/10/2019, 01/21/2020) by Dr. Jannett Maree [facility])  Diagnostic bilateral (UE) EMG (11/19/2019) by Dr. Jannett Maree [facility]) Dx: (R) Grade 1 CTS. Therapeutic occipital NB + TPI x1 (04/18/2019) by Dr. Jannett Maree [facility])  Therapeutic sphenopalatine (V2 trigeminal NB) x2 (03/30/2017, 04/06/2017) by Dr. Jannett Maree [facility])  Therapeutic left L4/L5 TFESI x1 (December 2024) by unknown provider.  [facility])  Therapeutic bilateral L4/L5 TFESI x2 (10/26/2023, 01/04/2024) by unknown provider.  ([facility])  Therapeutic L5-S1 LESI x1 (01/04/2024) by unknown provider.  ([facility])  Diagnostic left L4-5, L5-S1 lumbar facet BLK x2 (02/17/2024, 03/14/2024) by unknown provider.  ([facility])  Therapeutic left L4-5, L5-S1 lumbar facet MB RFA x1 (04/11/2024) by unknown provider.  ([facility])   1(Analgesic benefit): Expressed in percentage (%). (Local anesthetic[LA] +/- sedation  L.A.Local Anesthetic  Steroid benefit  Ongoing benefit)      Follow-up plan:   Return in about 2 weeks (around 08/09/2024) for (Face2F), (PPE).     Recent Visits Date Type Provider Dept  07/11/24 Office Visit Tanya Glisson, MD [facility]  06/27/24 Office Visit Patel, Seema K, NP [facility]  05/30/24 Office Visit Tanya Glisson, MD [facility]  05/07/24 Office Visit Tanya Glisson, MD [facility]  Showing recent visits within past 90 days and meeting all other requirements Today's Visits Date Type Provider Dept  07/26/24 Procedure visit Tanya Glisson, MD [facility]  Showing today's visits and meeting all other requirements Future Appointments No visits were found meeting these conditions. Showing future appointments within next 90 days and meeting all other requirements   Disposition: Discharge home  Discharge (Date  Time): 07/26/2024;   hrs.   Primary Care Physician: Eliverto Bette Hover, MD Location: [facility] Note by: Glisson DELENA Tanya, MD (TTS technology used. I apologize for any typographical errors that were not detected and corrected.) Date: 07/26/2024; Time: 10:43 AM  Disclaimer:  Medicine is not an visual merchandiser. The only guarantee in medicine is that nothing is guaranteed. It is important to note that the decision to proceed with this intervention was based on the information collected from the patient. The Data and conclusions were drawn from the patient's questionnaire, the interview, and the physical examination. Because the information was provided in large part by the patient, it cannot be guaranteed that it has not been purposely or unconsciously manipulated. Every effort has been made to obtain as much relevant data as possible for this evaluation. It is important to note that the conclusions that lead to this procedure are derived in large part from the available data. Always take into account that the treatment will also be dependent on availability of resources and existing treatment guidelines, considered by other Pain Management Practitioners as being common  knowledge and practice, at  the time of the intervention. For Medico-Legal purposes, it is also important to point out that variation in procedural techniques and pharmacological choices are the acceptable norm. The indications, contraindications, technique, and results of the above procedure should only be interpreted and judged by a Board-Certified Interventional Pain Specialist with extensive familiarity and expertise in the same exact procedure and technique.

## 2024-07-26 ENCOUNTER — Ambulatory Visit: Admitting: Pain Medicine

## 2024-07-26 ENCOUNTER — Encounter: Payer: Self-pay | Admitting: Pain Medicine

## 2024-07-26 ENCOUNTER — Ambulatory Visit
Admission: RE | Admit: 2024-07-26 | Discharge: 2024-07-26 | Disposition: A | Discharge status: Home / Self Care | Source: Ambulatory Visit | Attending: Pain Medicine | Admitting: Pain Medicine

## 2024-07-26 VITALS — BP 122/94 | HR 82 | Temp 97.2°F | Resp 13 | Ht 64.0 in | Wt 214.0 lb

## 2024-07-26 DIAGNOSIS — R350 Frequency of micturition: Secondary | ICD-10-CM | POA: Insufficient documentation

## 2024-07-26 DIAGNOSIS — M47817 Spondylosis without myelopathy or radiculopathy, lumbosacral region: Secondary | ICD-10-CM | POA: Insufficient documentation

## 2024-07-26 DIAGNOSIS — M47816 Spondylosis without myelopathy or radiculopathy, lumbar region: Secondary | ICD-10-CM

## 2024-07-26 DIAGNOSIS — M545 Low back pain, unspecified: Secondary | ICD-10-CM | POA: Insufficient documentation

## 2024-07-26 DIAGNOSIS — R102 Pelvic and perineal pain unspecified side: Secondary | ICD-10-CM

## 2024-07-26 DIAGNOSIS — R339 Retention of urine, unspecified: Secondary | ICD-10-CM

## 2024-07-26 DIAGNOSIS — R55 Syncope and collapse: Secondary | ICD-10-CM | POA: Insufficient documentation

## 2024-07-26 DIAGNOSIS — M4316 Spondylolisthesis, lumbar region: Secondary | ICD-10-CM | POA: Insufficient documentation

## 2024-07-26 DIAGNOSIS — R7982 Elevated C-reactive protein (CRP): Secondary | ICD-10-CM

## 2024-07-26 DIAGNOSIS — M79605 Pain in left leg: Secondary | ICD-10-CM | POA: Diagnosis present

## 2024-07-26 DIAGNOSIS — M5459 Other low back pain: Secondary | ICD-10-CM

## 2024-07-26 DIAGNOSIS — G8929 Other chronic pain: Secondary | ICD-10-CM | POA: Insufficient documentation

## 2024-07-26 DIAGNOSIS — M5134 Other intervertebral disc degeneration, thoracic region: Secondary | ICD-10-CM | POA: Diagnosis present

## 2024-07-26 MED ORDER — PENTAFLUOROPROP-TETRAFLUOROETH EX AERO
INHALATION_SPRAY | Freq: Once | CUTANEOUS | Status: AC
  Administered 2024-07-26: 30 via TOPICAL

## 2024-07-26 MED ORDER — TRIAMCINOLONE ACETONIDE 40 MG/ML IJ SUSP
INTRAMUSCULAR | Status: AC
  Filled 2024-07-26: qty 1

## 2024-07-26 MED ORDER — LIDOCAINE HCL (PF) 2 % IJ SOLN
INTRAMUSCULAR | Status: AC
  Filled 2024-07-26: qty 10

## 2024-07-26 MED ORDER — ROPIVACAINE HCL 2 MG/ML IJ SOLN
2.0000 mL | Freq: Once | INTRAMUSCULAR | Status: AC
  Administered 2024-07-26: 2 mL via EPIDURAL

## 2024-07-26 MED ORDER — GLYCOPYRROLATE 0.2 MG/ML IJ SOLN
INTRAMUSCULAR | Status: AC
  Filled 2024-07-26: qty 1

## 2024-07-26 MED ORDER — SODIUM CHLORIDE (PF) 0.9 % IJ SOLN
INTRAMUSCULAR | Status: AC
  Filled 2024-07-26: qty 10

## 2024-07-26 MED ORDER — MIDAZOLAM HCL 5 MG/5ML IJ SOLN
0.5000 mg | Freq: Once | INTRAMUSCULAR | Status: AC
  Administered 2024-07-26: 2 mg via INTRAVENOUS

## 2024-07-26 MED ORDER — TRIAMCINOLONE ACETONIDE 40 MG/ML IJ SUSP
40.0000 mg | Freq: Once | INTRAMUSCULAR | Status: AC
  Administered 2024-07-26: 40 mg

## 2024-07-26 MED ORDER — IOHEXOL 180 MG/ML  SOLN
INTRAMUSCULAR | Status: AC
  Filled 2024-07-26: qty 10

## 2024-07-26 MED ORDER — ROPIVACAINE HCL 2 MG/ML IJ SOLN
INTRAMUSCULAR | Status: AC
  Filled 2024-07-26: qty 20

## 2024-07-26 MED ORDER — SODIUM CHLORIDE 0.9% FLUSH
2.0000 mL | Freq: Once | INTRAVENOUS | Status: AC
  Administered 2024-07-26: 2 mL

## 2024-07-26 MED ORDER — MIDAZOLAM HCL 5 MG/5ML IJ SOLN
INTRAMUSCULAR | Status: AC
  Filled 2024-07-26: qty 5

## 2024-07-26 MED ORDER — FENTANYL CITRATE (PF) 100 MCG/2ML IJ SOLN
INTRAMUSCULAR | Status: AC
  Filled 2024-07-26: qty 2

## 2024-07-26 MED ORDER — FENTANYL CITRATE (PF) 100 MCG/2ML IJ SOLN
25.0000 ug | INTRAMUSCULAR | Status: DC | PRN
  Administered 2024-07-26: 50 ug via INTRAVENOUS

## 2024-07-26 MED ORDER — IOHEXOL 180 MG/ML  SOLN
10.0000 mL | Freq: Once | INTRAMUSCULAR | Status: AC
  Administered 2024-07-26: 10 mL via EPIDURAL

## 2024-07-26 MED ORDER — LIDOCAINE HCL 2 % IJ SOLN
20.0000 mL | Freq: Once | INTRAMUSCULAR | Status: AC
  Administered 2024-07-26: 100 mg

## 2024-07-26 MED ORDER — GLYCOPYRROLATE 0.2 MG/ML IJ SOLN
0.2000 mg | Freq: Once | INTRAMUSCULAR | Status: AC
  Administered 2024-07-26: 0.2 mg via INTRAVENOUS

## 2024-07-26 NOTE — Patient Instructions (Signed)
 ______________________________________________________________________    Post-Procedure Discharge Instructions  INSTRUCTIONS Apply ice:  Purpose: This will minimize any swelling and discomfort after procedure.  When: Day of procedure, as soon as you get home. How: Fill a plastic sandwich bag with crushed ice. Cover it with a small towel and apply to injection site. How long: (15 min on, 15 min off) Apply for 15 minutes then remove x 15 minutes.  Repeat sequence on day of procedure, until you go to bed. Apply heat:  Purpose: To treat any soreness and discomfort from the procedure. When: Starting the next day after the procedure. How: Apply heat to procedure site starting the day following the procedure. How long: May continue to repeat daily, until discomfort goes away. Food intake: Start with clear liquids (like water) and advance to regular food, as tolerated.  Physical activities: Keep activities to a minimum for the first 8 hours after the procedure. After that, then as tolerated. Driving: If you have received any sedation, be responsible and do not drive. You are not allowed to drive for 24 hours after having sedation. Blood thinner: (Applies only to those taking blood thinners) You may restart your blood thinner 6 hours after your procedure. Insulin: (Applies only to Diabetic patients taking insulin) As soon as you can eat, you may resume your normal dosing schedule. Infection prevention: Keep procedure site clean and dry. Shower daily and clean area with soap and water.  PAIN DIARY Post-procedure Pain Diary: Extremely important that this be done correctly and accurately. Recorded information will be used to determine the next step in treatment. For the purpose of accuracy, follow these rules: Evaluate only the area treated. Do not report or include pain from an untreated area. For the purpose of this evaluation, ignore all other areas of pain, except for the treated area. After your  procedure, avoid taking a long nap and attempting to complete the pain diary after you wake up. Instead, set your alarm clock to go off every hour, on the hour, for the initial 8 hours after the procedure. Document the duration of the numbing medicine, and the relief you are getting from it. Do not go to sleep and attempt to complete it later. It will not be accurate. If you received sedation, it is likely that you were given a medication that may cause amnesia. Because of this, completing the diary at a later time may cause the information to be inaccurate. This information is needed to plan your care. Follow-up appointment: Keep your post-procedure follow-up evaluation appointment after the procedure (usually 2 weeks for most procedures, 6 weeks for radiofrequencies). DO NOT FORGET to bring you pain diary with you.   EXPECT... (What should I expect to see with my procedure?) From numbing medicine (AKA: Local Anesthetics): Numbness or decrease in pain. You may also experience some weakness, which if present, could last for the duration of the local anesthetic. Onset: Full effect within 15 minutes of injected. Duration: It will depend on the type of local anesthetic used. On the average, 1 to 8 hours.  From steroids (Applies only if steroids were used): Decrease in swelling or inflammation. Once inflammation is improved, relief of the pain will follow. Onset of benefits: Depends on the amount of swelling present. The more swelling, the longer it will take for the benefits to be seen. In some cases, up to 10 days. Duration: Steroids will stay in the system x 2 weeks. Duration of benefits will depend on multiple posibilities including persistent irritating  factors. Side-effects: If present, they may typically last 2 weeks (the duration of the steroids). Frequent: Cramps (if they occur, drink Gatorade and take over-the-counter Magnesium 450-500 mg once to twice a day); water retention with temporary weight  gain; increases in blood sugar; decreased immune system response; increased appetite. Occasional: Facial flushing (red, warm cheeks); mood swings; menstrual changes. Uncommon: Long-term decrease or suppression of natural hormones; bone thinning. (These are more common with higher doses or more frequent use. This is why we prefer that our patients avoid having any injection therapies in other practices.)  Very Rare: Severe mood changes; psychosis; aseptic necrosis. From procedure: Some discomfort is to be expected once the numbing medicine wears off. This should be minimal if ice and heat are applied as instructed.  CALL IF... (When should I call?) You experience numbness and weakness that gets worse with time, as opposed to wearing off. New onset bowel or bladder incontinence. (Applies only to procedures done in the spine)  Emergency Numbers: Durning business hours (Monday - Thursday, 8:00 AM - 4:00 PM) (Friday, 9:00 AM - 12:00 Noon): [phone] After hours: [phone] NOTE: If you are having a problem and are unable connect with, or to talk to a provider, then go to your nearest urgent care or emergency department. If the problem is serious and urgent, please call 911. ______________________________________________________________________     ______________________________________________________________________    Steroid injections  Common steroids for injections Triamcinolone : Used by many sports medicine physicians for large joint and bursal injections, often combined with a local anesthetic like lidocaine . A study focusing on coccydynia (tailbone pain) found triamcinolone  was more effective than betamethasone, suggesting it may also be preferable for other localized inflammation conditions. Methylprednisolone: A common alternative to triamcinolone  that is also a strong anti-inflammatory. It is available in different formulations, with the acetate suspension being the long-acting  option for intra-articular injections. Dexamethasone : This is a non-particulate steroid, meaning it has a lower risk of tissue damage compared to particulate steroids like triamcinolone  and methylprednisolone. While less common for this specific use, it is an option for targeted injections.   Considerations for physicians Particulate vs. non-particulate steroids: Triamcinolone  and methylprednisolone are particulate, meaning they can clump together. Dexamethasone  is non-particulate. Particulate steroids are often preferred for their longer-lasting effects but carry a theoretical higher risk for certain injections (though this is less of a concern in the costochondral joints). Combined injectate: Corticosteroids are typically mixed with a local anesthetic like lidocaine  to provide both immediate pain relief (from the anesthetic) and longer-term inflammation reduction (from the steroid). Imaging guidance: To ensure accurate placement of the needle and medication, physicians may use ultrasound or fluoroscopic guidance for the injection, especially in complex or refractory cases.   Patient guidance Before undergoing a steroid injection, discuss the options with your physician. They will determine the best steroid, dosage, and procedure for your specific case based on factors like: Severity of your condition History of response to other treatments Your overall health status Experience and preference of the physician  Last  Updated: 05/15/2024 ______________________________________________________________________

## 2024-07-27 ENCOUNTER — Telehealth: Payer: Self-pay | Admitting: *Deleted

## 2024-07-27 NOTE — Telephone Encounter (Signed)
 No problems post procedure.

## 2024-08-19 NOTE — Progress Notes (Unsigned)
 PROVIDER NOTE: Interpretation of information contained herein should be left to medically-trained personnel. Specific patient instructions are provided elsewhere under Patient Instructions section of medical record. This document was created in part using AI and STT-dictation technology, any transcriptional errors that may result from this process are unintentional.  Patient: Dominique Dyer  Service: E/M   PCP: Eliverto Bette Hover, MD  DOB: 1981/03/22  DOS: 08/20/2024  Provider: Eric DELENA Como, MD  MRN: [id number]  Delivery: Face-to-face  Specialty: Interventional Pain Management  Type: Established Patient  Setting: Ambulatory outpatient facility  Specialty designation: 09  Referring Prov.: Eliverto Bette Hover, *  Location: Outpatient office facility       History of present illness (HPI) Dominique Dyer, a 43 y.o. year old female, is here today because of her Chronic pain of left lower extremity [M79.605, G89.29]. Dominique Dyer primary complain today is No chief complaint on file.  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); Intractable chronic migraine without aura and without status migrainosus; Rib pain; Chronic pain syndrome; Myofascial pain; Lumbar radiculopathy; Spondylolisthesis of lumbar region; Lumbar spondylosis; Myofascial pain syndrome of lumbar spine; Chronic low back pain (1ry area of Pain) (Bilateral) (L>R) w/o sciatica; Chronic lower extremity pain (2ry area of Pain) (Left); Chronic hip pain (Left); Chronic sacroiliac joint pain (Left); Lumbar facet joint pain (Bilateral); Lumbar facet joint syndrome; Abnormal NCS (nerve conduction studies) (UE); Chronic neck pain; Cervical facet joint pain (Bilateral) (L>R); Cervical spine crepitus; Cervicogenic headache (Bilateral) (L>R); Spondylosis without myelopathy or radiculopathy, cervical region; Spondylosis without myelopathy or radiculopathy, lumbar region;  Osteoarthritis of sacroiliac joints (HCC) (Bilateral); Lumbosacral facet hypertrophy (Bilateral) (L5-S1); Cervical facet hypertrophy (Left: C2-3); Cervicalgia; Neck pain of over 3 months duration; Grade 1 (5 mm) Anterolisthesis of L4/L5 (stable); Low back pain of over 3 months duration; Low back pain radiating to left leg; Intractable low back pain; Multifactorial low back pain; and Lumbar facet hypertrophy (Bilateral) (L4-5, L5-S1) on their pertinent problem list.  Pain Assessment: Severity of   is reported as a  /10. Location:    / . Onset:  . Quality:  . Timing:  . Modifying factor(s):  SABRA Vitals:  vitals were not taken for this visit.  BMI: Estimated body mass index is 36.73 kg/m as calculated from the following:   Height as of 07/26/24: 5' 4 (1.626 m).   Weight as of 07/26/24: 214 lb (97.1 kg).  Last encounter: 07/11/2024. Last procedure: 07/26/2024.  Reason for encounter: post-procedure evaluation and assessment.   Discussed the use of AI scribe software for clinical note transcription with the patient, who gave verbal consent to proceed.  History of Present Illness          Post-Procedure Evaluation   Type: Lumbar epidural steroid injection (LESI) (interlaminar) #1    Laterality: Right   Level:  L5-S1 Level.  Imaging: Fluoroscopic guidance Spinal ([id number]) Anesthesia: Local anesthesia (1-2% Lidocaine ) Anxiolysis: IV Versed  2.0 mg + Glycopyrrolate  0.2 mg IV Sedation: Minimal Sedation Fentanyl  1 mL (50 mcg) DOS: 07/26/2024  Performed by: Eric DELENA Como, MD  Purpose: Diagnostic/Therapeutic Indications: Lumbar radicular pain of intraspinal etiology of more than 4 weeks that has failed to respond to conservative therapy and is severe enough to impact quality of life or function. 1. Chronic low back pain (1ry area of Pain) (Bilateral) (L>R) w/o sciatica   2. Chronic lower extremity pain (2ry area of Pain) (Left)   3. Grade 1 (5 mm) Anterolisthesis of  L4/L5 (stable)   4. Low  back pain of over 3 months duration   5. Lumbar facet joint pain (Bilateral)   6. History of Vasovagal episode    NAS-11 Pain score:   Pre-procedure: 6 /10   Post-procedure: 0-No pain/10     Effectiveness:  Initial hour after procedure:   ***. Subsequent 4-6 hours post-procedure:   ***. Analgesia past initial 6 hours:   ***. Ongoing improvement:  Analgesic:  *** Function:    ***    ROM:    ***    Interpretation: ***  Pharmacotherapy Assessment   Opioid Analgesic: None MME/day: 0 mg/day   Monitoring: [place] PMP: PDMP reviewed during this encounter.       Pharmacotherapy: No side-effects or adverse reactions reported. Compliance: No problems identified. Effectiveness: Clinically acceptable.  No notes on file  UDS:  Summary  Date Value Ref Range Status  05/07/2024 FINAL  Final    Comment:    ==================================================================== Compliance Drug Analysis, Ur ==================================================================== Test                             Result       Flag       Units  Drug Present and Declared for Prescription Verification   Cyclobenzaprine                 PRESENT      EXPECTED   Desmethylcyclobenzaprine       PRESENT      EXPECTED    Desmethylcyclobenzaprine is an expected metabolite of    cyclobenzaprine .    Diclofenac                     PRESENT      EXPECTED  Drug Present not Declared for Prescription Verification   Alprazolam                      166          UNEXPECTED ng/mg creat   Alpha-hydroxyalprazolam        389          UNEXPECTED ng/mg creat    Source of alprazolam  is a scheduled prescription medication. Alpha-    hydroxyalprazolam is an expected metabolite of alprazolam .    Carboxy-THC                    >1000        UNEXPECTED ng/mg creat    Carboxy-THC is a metabolite of tetrahydrocannabinol (THC). Source of    THC is most commonly herbal marijuana or marijuana-based products,    but THC is also  present in a scheduled prescription medication.    Trace amounts of THC can be present in hemp and cannabidiol (CBD)    products. This test is not intended to distinguish between delta-9-    tetrahydrocannabinol, the predominant form of THC in most herbal or    marijuana-based products, and delta-8-tetrahydrocannabinol.    Acetaminophen                   PRESENT      UNEXPECTED ==================================================================== Test                      Result    Flag   Units      Ref Range   Creatinine              100  mg/dL      >=79 ==================================================================== Declared Medications:  The flagging and interpretation on this report are based on the  following declared medications.  Unexpected results may arise from  inaccuracies in the declared medications.   **Note: The testing scope of this panel includes these medications:   Cyclobenzaprine  (Flexeril )   **Note: The testing scope of this panel does not include small to  moderate amounts of these reported medications:   Diclofenac (Voltaren)   **Note: The testing scope of this panel does not include the  following reported medications:   Pantoprazole  (Protonix )  Rizatriptan (Maxalt) ==================================================================== For clinical consultation, please call [phone]. ====================================================================     No results found for: CBDTHCR No results found for: D8THCCBX No results found for: D9THCCBX  ROS  Constitutional: Denies any fever or chills Gastrointestinal: No reported hemesis, hematochezia, vomiting, or acute GI distress Musculoskeletal: Denies any acute onset joint swelling, redness, loss of ROM, or weakness Neurological: No reported episodes of acute onset apraxia, aphasia, dysarthria, agnosia, amnesia, paralysis, loss of coordination, or loss of  consciousness  Medication Review  ALPRAZolam , acetaminophen , cyclobenzaprine , diclofenac, magnesium gluconate, pantoprazole , and rizatriptan  History Review  Allergy: Ms. Trachtenberg is allergic to aspirin , naproxen , sumatriptan succinate, and lactose intolerance (gi). Drug: Ms. Bellissimo  reports current drug use. Drug: Marijuana. Alcohol:  reports no history of alcohol use. Tobacco:  reports that she quit smoking about 5 months ago. Her smoking use included cigars. She has never used smokeless tobacco. Social: Ms. Kalp  reports that she quit smoking about 5 months ago. Her smoking use included cigars. She has never used smokeless tobacco. She reports current drug use. Drug: Marijuana. She reports that she does not drink alcohol. Medical:  has a past medical history of Allergy, Anemia (2019), Anginal pain (2019), Anxiety, Arthritis, Bipolar disorder (HCC), Depression, Dyspnea, Dysrhythmia (2019), GERD (gastroesophageal reflux disease), Herniated lumbar intervertebral disc, Hypertension (2019), Migraines, Panic attacks, and Pica (2019). Surgical: Ms. Blackson  has a past surgical history that includes Cesarean section; Colposcopy (2006); Cesarean section (Bilateral, 04/14/2018); and Tubal ligation (2019). Family: family history includes Anxiety disorder in her maternal grandmother and mother; Autism in her son; Bipolar disorder in her maternal grandmother; Cancer in her maternal grandfather and paternal uncle; Diabetes in her mother; Diabetic kidney disease in her paternal uncle; Heart attack in her paternal grandfather; Heart attack (age of onset: 74) in her mother; OCD in her maternal grandmother; Schizophrenia in her maternal grandmother; Seizures in her father; Thyroid  disease in her paternal grandmother.  Laboratory Chemistry Profile   Renal Lab Results  Component Value Date   BUN 13 07/02/2024   CREATININE 0.72 07/02/2024   BCR 16 05/07/2024   GFRAA >60 04/13/2018   GFRNONAA >60 07/02/2024     Hepatic Lab Results  Component Value Date   AST 39 05/07/2024   ALT 20 01/19/2024   ALBUMIN 4.4 05/07/2024   ALKPHOS 72 05/07/2024   LIPASE 32 07/02/2024    Electrolytes Lab Results  Component Value Date   NA 139 07/02/2024   K 4.1 07/02/2024   CL 103 07/02/2024   CALCIUM 9.5 07/02/2024   MG 2.1 05/07/2024    Bone Lab Results  Component Value Date   25OHVITD1 41 05/07/2024   25OHVITD2 36 05/07/2024   25OHVITD3 5.3 05/07/2024    Inflammation (CRP: Acute Phase) (ESR: Chronic Phase) Lab Results  Component Value Date   CRP 15 (H) 05/07/2024   ESRSEDRATE 25 05/07/2024  Note: Above Lab results reviewed.  Recent Imaging Review  DG PAIN CLINIC C-ARM 1-60 MIN NO REPORT Fluoro was used, but no Radiologist interpretation will be provided.  Please refer to NOTES tab for provider progress note. Note: Reviewed        Physical Exam  Vitals: There were no vitals taken for this visit. BMI: Estimated body mass index is 36.73 kg/m as calculated from the following:   Height as of 07/26/24: 5' 4 (1.626 m).   Weight as of 07/26/24: 214 lb (97.1 kg). Ideal: Patient weight not recorded General appearance: Well nourished, well developed, and well hydrated. In no apparent acute distress Mental status: Alert, oriented x 3 (person, place, & time)       Respiratory: No evidence of acute respiratory distress Eyes: PERLA   Assessment   Diagnosis Status  1. Chronic lower extremity pain (2ry area of Pain) (Left)   2. Chronic low back pain (1ry area of Pain) (Bilateral) (L>R) w/o sciatica   3. Low back pain of over 3 months duration   4. Lumbar facet joint pain (Bilateral)   5. Pelvic pain in female   6. History of Vasovagal episode   7. Postop check    Controlled Controlled Controlled   Updated Problems: No problems updated.  Plan of Care  Problem-specific:  Assessment and Plan            Ms. BRENNAN KARAM has a current medication list which includes the  following long-term medication(s): pantoprazole  and rizatriptan.  Pharmacotherapy (Medications Ordered): No orders of the defined types were placed in this encounter.  Orders:  No orders of the defined types were placed in this encounter.    Interventional Therapies  Risk Factors  Considerations  Medical Comorbidities:  (05/07/2024) UDS (+) Carboxy-THC (Marijuana)  GERD  HTN  tobacco dependence  Hx. Vasovagal Response to MNB/TPI by Dr. Maree     Planned  Pending:   Diagnostic bilateral lumbar facet (L3-4, L4-5, and L5-S1) MBB #1    Under consideration:   Diagnostic Lumbar MRI  Diagnostic bilateral lumbar facet MBB #1 with possible RFA follow-up Diagnostic bilateral sacroiliac joint block #1 with possible RFA follow-up  Diagnostic bilateral cervical facet MBB #1 with possible RFA follow-up    Completed: (Analgesic benefit)1  None at this time   Therapeutic  Palliative (PRN) options:   None established   Completed by other providers:   Procedures done at [facility] & Dr. Maree [facility])  Diagnostic lumbar MRI (07/2023) apparently done at [facility] with no available report in our system Therapeutic MNB/TPI x1 (03/28/2024) by Dr. Jannett Maree [facility]) (Vasovagal RXN)  Therapeutic right CTS inj. x2 (12/10/2019, 01/21/2020) by Dr. Jannett Maree [facility])  Diagnostic bilateral (UE) EMG (11/19/2019) by Dr. Jannett Maree [facility]) Dx: (R) Grade 1 CTS. Therapeutic occipital NB + TPI x1 (04/18/2019) by Dr. Jannett [facility]are[facility])  Therapeutic sphenopalatine (V2 trigeminal NB) x2 (03/30/2017, 04/06/2017) by Dr. Jannett Maree [facility])  Therapeutic left L4/L5 TFESI x1 (December 2024) by unknown provider.  [facility])  Therapeutic bilateral L4/L5 TFESI x2 (10/26/2023, 01/04/2024) by unknown provider.  ([facility])  Therapeutic L5-S1 LESI x1 (01/04/2024) by unknown provider.  ([facility])  Diagnostic left L4-5, L5-S1 lumbar facet BLK x2 (02/17/2024, 03/14/2024) by  unknown provider.  ([facility])  Therapeutic left L4-5, L5-S1 lumbar facet MB RFA x1 (04/11/2024) by unknown provider.  ([facility])   1(Analgesic benefit): Expressed in percentage (%). (Local anesthetic[LA] +/- sedation  L.A.Local Anesthetic  Steroid benefit  Ongoing benefit)     No follow-ups on file.    Recent Visits Date Type Provider Dept  07/26/24 Procedure visit Tanya Glisson, MD [facility]  07/11/24 Office Visit Tanya Glisson, MD [facility]  06/27/24 Office Visit Patel, Seema K, NP [facility]  05/30/24 Office Visit Tanya Glisson, MD [facility]  Showing recent visits within past 90 days and meeting all other requirements Future Appointments Date Type Provider Dept  08/20/24 Appointment Tanya Glisson, MD [facility]  Showing future appointments within next 90 days and meeting all other requirements  I discussed the assessment and treatment plan with the patient. The patient was provided an opportunity to ask questions and all were answered. The patient agreed with the plan and demonstrated an understanding of the instructions.  Patient advised to call back or seek an in-person evaluation if the symptoms or condition worsens.  Duration of encounter: *** minutes.  Total time on encounter, as per AMA guidelines included both the face-to-face and non-face-to-face time personally spent by the physician and/or other qualified health care professional(s) on the day of the encounter (includes time in activities that require the physician or other qualified health care professional and does not include time in activities normally performed by clinical staff). Physician's time may include the following activities when performed: Preparing to see the patient (e.g., pre-charting review of records, searching for previously ordered imaging, lab work, and nerve conduction tests) Review of prior analgesic  pharmacotherapies. Reviewing PMP Interpreting ordered tests (e.g., lab work, imaging, nerve conduction tests) Performing post-procedure evaluations, including interpretation of diagnostic procedures Obtaining and/or reviewing separately obtained history Performing a medically appropriate examination and/or evaluation Counseling and educating the patient/family/caregiver Ordering medications, tests, or procedures Referring and communicating with other health care professionals (when not separately reported) Documenting clinical information in the electronic or other health record Independently interpreting results (not separately reported) and communicating results to the patient/ family/caregiver Care coordination (not separately reported)  Note by: Glisson DELENA Tanya, MD (TTS and AI technology used. I apologize for any typographical errors that were not detected and corrected.) Date: 08/20/2024; Time: 10:40 AM

## 2024-08-20 ENCOUNTER — Ambulatory Visit: Admitting: Pain Medicine

## 2024-08-20 DIAGNOSIS — Z91199 Patient's noncompliance with other medical treatment and regimen due to unspecified reason: Secondary | ICD-10-CM

## 2024-08-20 DIAGNOSIS — G8929 Other chronic pain: Secondary | ICD-10-CM

## 2024-08-20 DIAGNOSIS — Z09 Encounter for follow-up examination after completed treatment for conditions other than malignant neoplasm: Secondary | ICD-10-CM

## 2024-08-20 DIAGNOSIS — R102 Pelvic and perineal pain unspecified side: Secondary | ICD-10-CM

## 2024-08-20 DIAGNOSIS — M5459 Other low back pain: Secondary | ICD-10-CM

## 2024-08-20 DIAGNOSIS — R55 Syncope and collapse: Secondary | ICD-10-CM

## 2024-08-20 DIAGNOSIS — M545 Low back pain, unspecified: Secondary | ICD-10-CM

## 2024-08-20 NOTE — Patient Instructions (Signed)

## 2024-09-05 ENCOUNTER — Ambulatory Visit: Admitting: Pain Medicine

## 2024-09-05 ENCOUNTER — Encounter: Payer: Self-pay | Admitting: Pain Medicine

## 2024-09-05 VITALS — BP 111/81 | HR 82 | Temp 97.5°F | Resp 16 | Ht 64.0 in | Wt 216.0 lb

## 2024-09-05 DIAGNOSIS — M79605 Pain in left leg: Secondary | ICD-10-CM | POA: Insufficient documentation

## 2024-09-05 DIAGNOSIS — M545 Low back pain, unspecified: Secondary | ICD-10-CM | POA: Diagnosis present

## 2024-09-05 DIAGNOSIS — M5459 Other low back pain: Secondary | ICD-10-CM | POA: Insufficient documentation

## 2024-09-05 DIAGNOSIS — G8929 Other chronic pain: Secondary | ICD-10-CM | POA: Diagnosis present

## 2024-09-05 DIAGNOSIS — Z09 Encounter for follow-up examination after completed treatment for conditions other than malignant neoplasm: Secondary | ICD-10-CM | POA: Diagnosis present

## 2024-09-05 NOTE — Progress Notes (Signed)
 PROVIDER NOTE: Interpretation of information contained herein should be left to medically-trained personnel. Specific patient instructions are provided elsewhere under Patient Instructions section of medical record. This document was created in part using AI and STT-dictation technology, any transcriptional errors that may result from this process are unintentional.  Patient: Dominique Dyer  Service: E/M   PCP: Eliverto Bette Hover, MD  DOB: 03-17-1981  DOS: 09/05/2024  Provider: Eric DELENA Como, MD  MRN: [id number]  Delivery: Face-to-face  Specialty: Interventional Pain Management  Type: Established Patient  Setting: Ambulatory outpatient facility  Specialty designation: 09  Referring Prov.: Eliverto Bette Hover, MD  Location: Outpatient office facility       History of present illness (HPI) Dominique Dyer, a 43 y.o. year old female, is here today because of her Chronic bilateral low back pain without sciatica [M54.50, G89.29]. Ms. Garver primary complain today is Back Pain  Pertinent problems: Ms. 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); Intractable chronic migraine without aura and without status migrainosus; Rib pain; Chronic pain syndrome; Myofascial pain; Lumbar radiculopathy; Spondylolisthesis of lumbar region; Lumbar spondylosis; Myofascial pain syndrome of lumbar spine; Chronic low back pain (1ry area of Pain) (Bilateral) (L>R) w/o sciatica; Chronic lower extremity pain (2ry area of Pain) (Left); Chronic hip pain (Left); Chronic sacroiliac joint pain (Left); Lumbar facet joint pain (Bilateral); Lumbar facet joint syndrome; Abnormal NCS (nerve conduction studies) (UE); Chronic neck pain; Cervical facet joint pain (Bilateral) (L>R); Cervical spine crepitus; Cervicogenic headache (Bilateral) (L>R); Spondylosis without myelopathy or radiculopathy, cervical region; Spondylosis without myelopathy or radiculopathy, lumbar region;  Osteoarthritis of sacroiliac joints (HCC) (Bilateral); Lumbosacral facet hypertrophy (Bilateral) (L5-S1); Cervical facet hypertrophy (Left: C2-3); Cervicalgia; Neck pain of over 3 months duration; Grade 1 (5 mm) Anterolisthesis of L4/L5 (stable); Low back pain of over 3 months duration; Low back pain radiating to left leg; Intractable low back pain; Multifactorial low back pain; and Lumbar facet hypertrophy (Bilateral) (L4-5, L5-S1) on their pertinent problem list.  Pain Assessment: Severity of Chronic pain is reported as a 3 /10. Location: Back Lower/denies on right but will radiate on side of leg to aboe the knee. Onset: More than a month ago. Quality: Tender, Discomfort, Sharp. Timing: Constant. Modifying factor(s): procedures, sometimes TENS, heating pad, ice. Vitals:  height is 5' 4 (1.626 m) and weight is 216 lb (98 kg). Her temperature is 97.5 F (36.4 C) (abnormal). Her blood pressure is 111/81 and her pulse is 82. Her respiration is 16 and oxygen saturation is 100%.  BMI: Estimated body mass index is 37.08 kg/m as calculated from the following:   Height as of this encounter: 5' 4 (1.626 m).   Weight as of this encounter: 216 lb (98 kg).  Last encounter: 07/26/2024. Last procedure: 07/26/2024.  Reason for encounter: post-procedure evaluation and assessment.   Discussed the use of AI scribe software for clinical note transcription with the patient, who gave verbal consent to proceed.  History of Present Illness   NISHAT Dyer is a 43 year old female who presents for follow-up after a right-sided L5-S1 lumbar epidural steroid injection.  She underwent her first right L5-S1 lumbar epidural steroid injection under fluoroscopic guidance on July 26, 2024. Her pain decreased from 6/10 pre-procedure to 0/10 immediately afterward with 100% relief during the local anesthetic effect, then stabilized at about 80% improvement and now about 50% improvement in right-sided pain.  Before the  injection she had bilateral low back pain, worse on the left,  with chronic left lower extremity pain. She has grade I anterolisthesis of L4 on L5 with related pars defects and 5 mm displacement.     Post-Procedure Evaluation   Type: Lumbar epidural steroid injection (LESI) (interlaminar) #1    Laterality: Right   Level:  L5-S1 Level.  Imaging: Fluoroscopic guidance Spinal ([id number]) Anesthesia: Local anesthesia (1-2% Lidocaine ) Anxiolysis: IV Versed  2.0 mg + Glycopyrrolate  0.2 mg IV Sedation: Minimal Sedation Fentanyl  1 mL (50 mcg) DOS: 07/26/2024  Performed by: Eric DELENA Como, MD  Purpose: Diagnostic/Therapeutic Indications: Lumbar radicular pain of intraspinal etiology of more than 4 weeks that has failed to respond to conservative therapy and is severe enough to impact quality of life or function. 1. Chronic low back pain (1ry area of Pain) (Bilateral) (L>R) w/o sciatica   2. Chronic lower extremity pain (2ry area of Pain) (Left)   3. Grade 1 (5 mm) Anterolisthesis of L4/L5 (stable)   4. Low back pain of over 3 months duration   5. Lumbar facet joint pain (Bilateral)   6. History of Vasovagal episode    NAS-11 Pain score:   Pre-procedure: 6 /10   Post-procedure: 0-No pain/10     Effectiveness:  Initial hour after procedure: 100 %. Subsequent 4-6 hours post-procedure: 80 %. Analgesia past initial 6 hours: 50 % (current on right side). Ongoing improvement:  Analgesic: According to the patient she attained 100% relief of the pain for the duration of the local anesthetic followed by a decrease to an 80% which then eventually settled into an ongoing 50% improvement of her right sided low back pain.  She still having some pain in the left side with some of this referred pain going down to the lateral area of the left hip.  For this reason we will plan on repeating the procedure but this time on the left side. Function: Ms. Violette reports improvement in function ROM: Ms. Corbridge  reports improvement in ROM Interpretation: Based on the above results, it would seem that the patient did attain quite a bit of benefit from the lumbar epidural steroid injection on the right side to the point where most of the symptoms that she was experiencing on the right side seem to now be gone.  However, she still having some symptoms on the left.  In reviewing the x-rays from the procedure, we can see that muscle spread of the contrast was on the right side with bowing of the plica medialis.      Pharmacotherapy Assessment   Opioid Analgesic: None MME/day: 0 mg/day   Monitoring: [place] PMP: PDMP reviewed during this encounter.       Pharmacotherapy: No side-effects or adverse reactions reported. Compliance: No problems identified. Effectiveness: Clinically acceptable.  Dorlene Montie FALCON, RN  09/05/2024 12:55 PM  Sign when Signing Visit Safety precautions to be maintained throughout the outpatient stay will include: orient to surroundings, keep bed in low position, maintain call bell within reach at all times, provide assistance with transfer out of bed and ambulation.     UDS:  Summary  Date Value Ref Range Status  05/07/2024 FINAL  Final    Comment:    ==================================================================== Compliance Drug Analysis, Ur ==================================================================== Test                             Result       Flag       Units  Drug Present and Declared for Prescription Verification  Cyclobenzaprine                 PRESENT      EXPECTED   Desmethylcyclobenzaprine       PRESENT      EXPECTED    Desmethylcyclobenzaprine is an expected metabolite of    cyclobenzaprine .    Diclofenac                     PRESENT      EXPECTED  Drug Present not Declared for Prescription Verification   Alprazolam                      166          UNEXPECTED ng/mg creat   Alpha-hydroxyalprazolam        389          UNEXPECTED ng/mg creat     Source of alprazolam  is a scheduled prescription medication. Alpha-    hydroxyalprazolam is an expected metabolite of alprazolam .    Carboxy-THC                    >1000        UNEXPECTED ng/mg creat    Carboxy-THC is a metabolite of tetrahydrocannabinol (THC). Source of    THC is most commonly herbal marijuana or marijuana-based products,    but THC is also present in a scheduled prescription medication.    Trace amounts of THC can be present in hemp and cannabidiol (CBD)    products. This test is not intended to distinguish between delta-9-    tetrahydrocannabinol, the predominant form of THC in most herbal or    marijuana-based products, and delta-8-tetrahydrocannabinol.    Acetaminophen                   PRESENT      UNEXPECTED ==================================================================== Test                      Result    Flag   Units      Ref Range   Creatinine              100              mg/dL      >=79 ==================================================================== Declared Medications:  The flagging and interpretation on this report are based on the  following declared medications.  Unexpected results may arise from  inaccuracies in the declared medications.   **Note: The testing scope of this panel includes these medications:   Cyclobenzaprine  (Flexeril )   **Note: The testing scope of this panel does not include small to  moderate amounts of these reported medications:   Diclofenac (Voltaren)   **Note: The testing scope of this panel does not include the  following reported medications:   Pantoprazole  (Protonix )  Rizatriptan (Maxalt) ==================================================================== For clinical consultation, please call [phone]. ====================================================================     No results found for: CBDTHCR No results found for: D8THCCBX No results found for: D9THCCBX  ROS  Constitutional:  Denies any fever or chills Gastrointestinal: No reported hemesis, hematochezia, vomiting, or acute GI distress Musculoskeletal: Denies any acute onset joint swelling, redness, loss of ROM, or weakness Neurological: No reported episodes of acute onset apraxia, aphasia, dysarthria, agnosia, amnesia, paralysis, loss of coordination, or loss of consciousness  Medication Review  ALPRAZolam , acetaminophen , cyclobenzaprine , diclofenac, magnesium gluconate, pantoprazole , and rizatriptan  History Review  Allergy: Ms. Eddinger is allergic to aspirin , naproxen , sumatriptan succinate, and  lactose intolerance (gi). Drug: Ms. Demos  reports current drug use. Drug: Marijuana. Alcohol:  reports no history of alcohol use. Tobacco:  reports that she quit smoking about 5 months ago. Her smoking use included cigars. She has never used smokeless tobacco. Social: Ms. Lukes  reports that she quit smoking about 5 months ago. Her smoking use included cigars. She has never used smokeless tobacco. She reports current drug use. Drug: Marijuana. She reports that she does not drink alcohol. Medical:  has a past medical history of Allergy, Anemia (2019), Anginal pain (2019), Anxiety, Arthritis, Bipolar disorder (HCC), Depression, Dyspnea, Dysrhythmia (2019), GERD (gastroesophageal reflux disease), Herniated lumbar intervertebral disc, Hypertension (2019), Migraines, Panic attacks, and Pica (2019). Surgical: Ms. Zellars  has a past surgical history that includes Cesarean section; Colposcopy (2006); Cesarean section (Bilateral, 04/14/2018); and Tubal ligation (2019). Family: family history includes Anxiety disorder in her maternal grandmother and mother; Autism in her son; Bipolar disorder in her maternal grandmother; Cancer in her maternal grandfather and paternal uncle; Diabetes in her mother; Diabetic kidney disease in her paternal uncle; Heart attack in her paternal grandfather; Heart attack (age of onset: 8) in her mother;  OCD in her maternal grandmother; Schizophrenia in her maternal grandmother; Seizures in her father; Thyroid  disease in her paternal grandmother.  Laboratory Chemistry Profile   Renal Lab Results  Component Value Date   BUN 13 07/02/2024   CREATININE 0.72 07/02/2024   BCR 16 05/07/2024   GFRAA >60 04/13/2018   GFRNONAA >60 07/02/2024    Hepatic Lab Results  Component Value Date   AST 39 05/07/2024   ALT 20 01/19/2024   ALBUMIN 4.4 05/07/2024   ALKPHOS 72 05/07/2024   LIPASE 32 07/02/2024    Electrolytes Lab Results  Component Value Date   NA 139 07/02/2024   K 4.1 07/02/2024   CL 103 07/02/2024   CALCIUM 9.5 07/02/2024   MG 2.1 05/07/2024    Bone Lab Results  Component Value Date   25OHVITD1 41 05/07/2024   25OHVITD2 36 05/07/2024   25OHVITD3 5.3 05/07/2024    Inflammation (CRP: Acute Phase) (ESR: Chronic Phase) Lab Results  Component Value Date   CRP 15 (H) 05/07/2024   ESRSEDRATE 25 05/07/2024         Note: Above Lab results reviewed.  Recent Imaging Review  DG PAIN [facility] C-ARM 1-60 MIN NO REPORT Fluoro was used, but no Radiologist interpretation will be provided.  Please refer to NOTES tab for provider progress note. Note: Reviewed        Physical Exam  Vitals: BP 111/81   Pulse 82   Temp (!) 97.5 F (36.4 C)   Resp 16   Ht 5' 4 (1.626 m)   Wt 216 lb (98 kg)   SpO2 100%   BMI 37.08 kg/m  BMI: Estimated body mass index is 37.08 kg/m as calculated from the following:   Height as of this encounter: 5' 4 (1.626 m).   Weight as of this encounter: 216 lb (98 kg). Ideal: Ideal body weight: 54.7 kg (120 lb 9.5 oz) Adjusted ideal body weight: 72 kg (158 lb 12.1 oz) General appearance: Well nourished, well developed, and well hydrated. In no apparent acute distress Mental status: Alert, oriented x 3 (person, place, & time)       Respiratory: No evidence of acute respiratory distress Eyes: PERLA   Assessment   Diagnosis Status  1. Chronic low  back pain (1ry area of Pain) (Bilateral) (L>R) w/o sciatica  2. Chronic lower extremity pain (2ry area of Pain) (Left)   3. Low back pain of over 3 months duration   4. Lumbar facet joint pain (Bilateral)   5. Postop check    Controlled Controlled Controlled   Updated Problems: No problems updated.  Plan of Care  Problem-specific:  Assessment and Plan    Chronic low back pain   She experiences bilateral low back pain, worse on the left side. A recent right-sided L5-S1 lumbar epidural steroid injection provided significant relief, initially 100% during local anesthetic, then 80%, and currently 50% improvement. Imaging showed contrast predominantly on the right side due to a membrane, explaining persistent left-sided symptoms. Plan to repeat lumbar epidural steroid injection on the left side after insurance approval. She must fast for at least 8 hours prior to the procedure and have a driver on the procedure day. An IV will be started, and out care medicine administered on the procedure day.  Chronic pain of left lower extremity   Chronic pain in the left lower extremity is likely related to lumbar spine issues. The current treatment plan for low back pain, including the planned left-sided lumbar epidural steroid injection, may also address this symptom.       Ms. DANYLE BOENING has a current medication list which includes the following long-term medication(s): pantoprazole  and rizatriptan.  Pharmacotherapy (Medications Ordered): No orders of the defined types were placed in this encounter.  Orders:  Orders Placed This Encounter  Procedures   Lumbar Epidural Injection    Standing Status:   Future    Expiration Date:   12/04/2024    Scheduling Instructions:     Procedure: Interlaminar Lumbar Epidural Steroid injection (LESI)  L5-S1     Laterality: Left-sided     Sedation: With Sedation.     Timeframe: ASAP    Where will this procedure be performed?:   [facility]    Nursing Instructions:    Please complete this patient's postprocedure evaluation.    Scheduling Instructions:     Please complete this patient's postprocedure evaluation.     Interventional Therapies  Risk Factors  Considerations  Medical Comorbidities:  (05/07/2024) UDS (+) Carboxy-THC (Marijuana)  GERD  HTN  tobacco dependence  Hx. Vasovagal Response to MNB/TPI by Dr. Maree     Planned  Pending:   Diagnostic bilateral lumbar facet (L3-4, L4-5, and L5-S1) MBB #1    Under consideration:   Diagnostic Lumbar MRI  Diagnostic bilateral lumbar facet MBB #1 with possible RFA follow-up Diagnostic bilateral sacroiliac joint block #1 with possible RFA follow-up  Diagnostic bilateral cervical facet MBB #1 with possible RFA follow-up    Completed: (Analgesic benefit)1  None at this time   Therapeutic  Palliative (PRN) options:   None established   Completed by other providers:   Procedures done at [facility] & Dr. Maree [facility])  Diagnostic lumbar MRI (07/2023) apparently done at [facility] with no available report in our system Therapeutic MNB/TPI x1 (03/28/2024) by Dr. Jannett Maree [facility]) (Vasovagal RXN)  Therapeutic right CTS inj. x2 (12/10/2019, 01/21/2020) by Dr. Jannett Maree [facility])  Diagnostic bilateral (UE) EMG (11/19/2019) by Dr. Jannett Maree [facility]) Dx: (R) Grade 1 CTS. Therapeutic occipital NB + TPI x1 (04/18/2019) by Dr. Jannett Maree [facility])  Therapeutic sphenopalatine (V2 trigeminal NB) x2 (03/30/2017, 04/06/2017) by Dr. Jannett Maree [facility])  Therapeutic left L4/L5 TFESI x1 (December 2024) by unknown provider.  ([facility])  Therapeutic bilateral L4/L5 TFESI x2 (10/26/2023, 01/04/2024)  by unknown provider.  ([facility])  Therapeutic L5-S1 LESI x1 (01/04/2024) by unknown provider.  ([facility])  Diagnostic left L4-5, L5-S1 lumbar facet BLK x2 (02/17/2024, 03/14/2024) by unknown provider.  ([facility])  Therapeutic left L4-5, L5-S1 lumbar  facet MB RFA x1 (04/11/2024) by unknown provider.  ([facility])   1(Analgesic benefit): Expressed in percentage (%). (Local anesthetic[LA] +/- sedation  L.A.Local Anesthetic  Steroid benefit  Ongoing benefit)     Return for (ECT):(L) L5-S1 LESI #2 (Hx. Vasovagal).    Recent Visits Date Type Provider Dept  07/26/24 Procedure visit Tanya Glisson, MD [facility]  07/11/24 Office Visit Tanya Glisson, MD [facility]  06/27/24 Office Visit Patel, Seema K, NP [facility]  Showing recent visits within past 90 days and meeting all other requirements Today's Visits Date Type Provider Dept  09/05/24 Office Visit Tanya Glisson, MD [facility]  Showing today's visits and meeting all other requirements Future Appointments No visits were found meeting these conditions. Showing future appointments within next 90 days and meeting all other requirements  I discussed the assessment and treatment plan with the patient. The patient was provided an opportunity to ask questions and all were answered. The patient agreed with the plan and demonstrated an understanding of the instructions.  Patient advised to call back or seek an in-person evaluation if the symptoms or condition worsens.  Duration of encounter: 30 minutes.  Total time on encounter, as per AMA guidelines included both the face-to-face and non-face-to-face time personally spent by the physician and/or other qualified health care professional(s) on the day of the encounter (includes time in activities that require the physician or other qualified health care professional and does not include time in activities normally performed by clinical staff). Physician's time may include the following activities when performed: Preparing to see the patient (e.g., pre-charting review of records, searching for previously ordered imaging, lab work, and nerve conduction tests) Review of prior analgesic  pharmacotherapies. Reviewing PMP Interpreting ordered tests (e.g., lab work, imaging, nerve conduction tests) Performing post-procedure evaluations, including interpretation of diagnostic procedures Obtaining and/or reviewing separately obtained history Performing a medically appropriate examination and/or evaluation Counseling and educating the patient/family/caregiver Ordering medications, tests, or procedures Referring and communicating with other health care professionals (when not separately reported) Documenting clinical information in the electronic or other health record Independently interpreting results (not separately reported) and communicating results to the patient/ family/caregiver Care coordination (not separately reported)  Note by: Glisson DELENA Tanya, MD (TTS and AI technology used. I apologize for any typographical errors that were not detected and corrected.) Date: 09/05/2024; Time: 1:07 PM

## 2024-09-05 NOTE — Patient Instructions (Signed)
 ______________________________________________________________________    Procedure instructions  Stop blood-thinners  Do not eat or drink fluids (other than water ) for 6 hours before your procedure  No water  for 2 hours before your procedure  Take your blood pressure medicine with a sip of water   Arrive 30 minutes before your appointment  If sedation is planned, bring suitable driver. [place], [place], & public transportation are NOT APPROVED)  Carefully read the Preparing for your procedure detailed instructions  If you have questions call us  at [phone]  Procedure appointments are for procedures only.   NO medication refills or new problem evaluations will be done on procedure days.   Only the scheduled, pre-approved procedure and side will be done.   ______________________________________________________________________     ______________________________________________________________________    Preparing for your procedure  Appointments: If you think you may not be able to keep your appointment, call 24-48 hours in advance to cancel. We need time to make it available to others.  Procedure visits are for procedures only. During your procedure appointment there will be: NO Prescription Refills*. NO medication changes or discussions*. NO discussion of disability issues*. NO unrelated pain problem evaluations*. NO evaluations to order other pain procedures*. *These will be addressed at a separate and distinct evaluation encounter on the provider's evaluation schedule and not during procedure days.  Instructions: Food intake: Avoid eating anything solid for at least 8 hours prior to your procedure. Clear liquid intake: You may take clear liquids such as water  up to 2 hours prior to your procedure. (No carbonated drinks. No soda.) Transportation: Unless otherwise stated by your physician, bring a driver. (Driver cannot be a Market researcher, Pharmacist, community, or any other form of public  transportation.) Morning Medicines: Except for blood thinners, take all of your other morning medications with a sip of water . Make sure to take your heart and blood pressure medicines. If your blood pressure's lower number is above 100, the case will be rescheduled. Blood thinners: Make sure to stop your blood thinners as instructed.  If you take a blood thinner, but were not instructed to stop it, call our office [phone] and ask to talk to a nurse. Not stopping a blood thinner prior to certain procedures could lead to serious complications. Diabetics on insulin : Notify the staff so that you can be scheduled 1st case in the morning. If your diabetes requires high dose insulin , take only  of your normal insulin  dose the morning of the procedure and notify the staff that you have done so. Preventing infections: Shower with an antibacterial soap the morning of your procedure.  Build-up your immune system: Take 1000 mg of Vitamin C with every meal (3 times a day) the day prior to your procedure. Antibiotics: Inform the nursing staff if you are taking any antibiotics or if you have any conditions that may require antibiotics prior to procedures. (Example: recent joint implants)   Pregnancy: If you are pregnant make sure to notify the nursing staff. Not doing so may result in injury to the fetus, including death.  Sickness: If you have a cold, fever, or any active infections, call and cancel or reschedule your procedure. Receiving steroids while having an infection may result in complications. Arrival: You must be in the facility at least 30 minutes prior to your scheduled procedure. Tardiness: Your scheduled time is also the cutoff time. If you do not arrive at least 15 minutes prior to your procedure, you will be rescheduled.  Children: Do not bring any children with  you. Make arrangements to keep them home. Dress appropriately: There is always a possibility that your clothing may get soiled. Avoid  long dresses. Valuables: Do not bring any jewelry or valuables.  Reasons to call and reschedule or cancel your procedure: (Following these recommendations will minimize the risk of a serious complication.) Surgeries: Avoid having procedures within 2 weeks of any surgery. (Avoid for 2 weeks before or after any surgery). Flu Shots: Avoid having procedures within 2 weeks of a flu shots or . (Avoid for 2 weeks before or after immunizations). Barium: Avoid having a procedure within 7-10 days after having had a radiological study involving the use of radiological contrast. (Myelograms, Barium swallow or enema study). Heart attacks: Avoid any elective procedures or surgeries for the initial 6 months after a Myocardial Infarction (Heart Attack). Blood thinners: It is imperative that you stop these medications before procedures. Let us  know if you if you take any blood thinner.  Infection: Avoid procedures during or within two weeks of an infection (including chest colds or gastrointestinal problems). Symptoms associated with infections include: Localized redness, fever, chills, night sweats or profuse sweating, burning sensation when voiding, cough, congestion, stuffiness, runny nose, sore throat, diarrhea, nausea, vomiting, cold or Flu symptoms, recent or current infections. It is specially important if the infection is over the area that we intend to treat. Heart and lung problems: Symptoms that may suggest an active cardiopulmonary problem include: cough, chest pain, breathing difficulties or shortness of breath, dizziness, ankle swelling, uncontrolled high or unusually low blood pressure, and/or palpitations. If you are experiencing any of these symptoms, cancel your procedure and contact your primary care physician for an evaluation.  Remember:  Regular Business hours are:  Monday to Thursday 8:00 AM to 4:00 PM  Provider's Schedule: Eric Como, MD:  Procedure days: Tuesday and Thursday 7:30  AM to 4:00 PM  Wallie Sherry, MD:  Procedure days: Monday and Wednesday 7:30 AM to 4:00 PM Last  Updated: 08/30/2023 ______________________________________________________________________     ______________________________________________________________________    General Risks and Possible Complications  Patient Responsibilities: It is important that you read this as it is part of your informed consent. It is our duty to inform you of the risks and possible complications associated with treatments offered to you. It is your responsibility as a patient to read this and to ask questions about anything that is not clear or that you believe was not covered in this document.  Patient's Rights: You have the right to refuse treatment. You also have the right to change your mind, even after initially having agreed to have the treatment done. However, under this last option, if you wait until the last second to change your mind, you may be charged for the materials used up to that point.  Introduction: Medicine is not an Visual merchandiser. Everything in Medicine, including the lack of treatment(s), carries the potential for danger, harm, or loss (which is by definition: Risk). In Medicine, a complication is a secondary problem, condition, or disease that can aggravate an already existing one. All treatments carry the risk of possible complications. The fact that a side effects or complications occurs, does not imply that the treatment was conducted incorrectly. It must be clearly understood that these can happen even when everything is done following the highest safety standards.  No treatment: You can choose not to proceed with the proposed treatment alternative. The "PRO(s)" would include: avoiding the risk of complications associated with the therapy. The "CON(s)" would include:  not getting any of the treatment benefits. These benefits fall under one of three categories: diagnostic; therapeutic; and/or  palliative. Diagnostic benefits include: getting information which can ultimately lead to improvement of the disease or symptom(s). Therapeutic benefits are those associated with the successful treatment of the disease. Finally, palliative benefits are those related to the decrease of the primary symptoms, without necessarily curing the condition (example: decreasing the pain from a flare-up of a chronic condition, such as incurable terminal cancer).  General Risks and Complications: These are associated to most interventional treatments. They can occur alone, or in combination. They fall under one of the following six (6) categories: no benefit or worsening of symptoms; bleeding; infection; nerve damage; allergic reactions; and/or death. No benefits or worsening of symptoms: In Medicine there are no guarantees, only probabilities. No healthcare provider can ever guarantee that a medical treatment will work, they can only state the probability that it may. Furthermore, there is always the possibility that the condition may worsen, either directly, or indirectly, as a consequence of the treatment. Bleeding: This is more common if the patient is taking a blood thinner, either prescription or over the counter (example: Goody Powders, Fish oil, Aspirin, Garlic, etc.), or if suffering a condition associated with impaired coagulation (example: Hemophilia, cirrhosis of the liver, low platelet counts, etc.). However, even if you do not have one on these, it can still happen. If you have any of these conditions, or take one of these drugs, make sure to notify your treating physician. Infection: This is more common in patients with a compromised immune system, either due to disease (example: diabetes, cancer, human immunodeficiency virus [HIV], etc.), or due to medications or treatments (example: therapies used to treat cancer and rheumatological diseases). However, even if you do not have one on these, it can still  happen. If you have any of these conditions, or take one of these drugs, make sure to notify your treating physician. Nerve Damage: This is more common when the treatment is an invasive one, but it can also happen with the use of medications, such as those used in the treatment of cancer. The damage can occur to small secondary nerves, or to large primary ones, such as those in the spinal cord and brain. This damage may be temporary or permanent and it may lead to impairments that can range from temporary numbness to permanent paralysis and/or brain death. Allergic Reactions: Any time a substance or material comes in contact with our body, there is the possibility of an allergic reaction. These can range from a mild skin rash (contact dermatitis) to a severe systemic reaction (anaphylactic reaction), which can result in death. Death: In general, any medical intervention can result in death, most of the time due to an unforeseen complication. ______________________________________________________________________      ______________________________________________________________________    Steroid injections  Common steroids for injections Triamcinolone: Used by many sports medicine physicians for large joint and bursal injections, often combined with a local anesthetic like lidocaine . A study focusing on coccydynia (tailbone pain) found triamcinolone was more effective than betamethasone , suggesting it may also be preferable for other localized inflammation conditions. Methylprednisolone: A common alternative to triamcinolone that is also a strong anti-inflammatory. It is available in different formulations, with the acetate suspension being the long-acting option for intra-articular injections. Dexamethasone : This is a non-particulate steroid, meaning it has a lower risk of tissue damage compared to particulate steroids like triamcinolone and methylprednisolone. While less common for this specific  use,  it is an option for targeted injections.   Considerations for physicians Particulate vs. non-particulate steroids: Triamcinolone and methylprednisolone are particulate, meaning they can clump together. Dexamethasone  is non-particulate. Particulate steroids are often preferred for their longer-lasting effects but carry a theoretical higher risk for certain injections (though this is less of a concern in the costochondral joints). Combined injectate: Corticosteroids are typically mixed with a local anesthetic like lidocaine  to provide both immediate pain relief (from the anesthetic) and longer-term inflammation reduction (from the steroid). Imaging guidance: To ensure accurate placement of the needle and medication, physicians may use ultrasound or fluoroscopic guidance for the injection, especially in complex or refractory cases.   Patient guidance Before undergoing a steroid injection, discuss the options with your physician. They will determine the best steroid, dosage, and procedure for your specific case based on factors like: Severity of your condition History of response to other treatments Your overall health status Experience and preference of the physician  Last  Updated: 05/15/2024 ______________________________________________________________________

## 2024-09-05 NOTE — Progress Notes (Signed)
 Safety precautions to be maintained throughout the outpatient stay will include: orient to surroundings, keep bed in low position, maintain call bell within reach at all times, provide assistance with transfer out of bed and ambulation.

## 2024-09-24 ENCOUNTER — Encounter: Payer: Self-pay | Admitting: Pain Medicine

## 2024-09-24 NOTE — Telephone Encounter (Signed)
 Can we order for her Toradol /Methocarbamol?

## 2024-10-11 ENCOUNTER — Ambulatory Visit: Admitting: Pain Medicine

## 2024-10-17 ENCOUNTER — Ambulatory Visit: Attending: Pain Medicine | Admitting: Pain Medicine

## 2024-10-17 VITALS — BP 115/80 | HR 90 | Temp 97.2°F | Resp 16 | Ht 64.0 in | Wt 202.0 lb

## 2024-10-17 DIAGNOSIS — M47816 Spondylosis without myelopathy or radiculopathy, lumbar region: Secondary | ICD-10-CM | POA: Insufficient documentation

## 2024-10-17 DIAGNOSIS — M47817 Spondylosis without myelopathy or radiculopathy, lumbosacral region: Secondary | ICD-10-CM | POA: Insufficient documentation

## 2024-10-17 DIAGNOSIS — G8929 Other chronic pain: Secondary | ICD-10-CM | POA: Insufficient documentation

## 2024-10-17 DIAGNOSIS — M545 Low back pain, unspecified: Secondary | ICD-10-CM | POA: Diagnosis not present

## 2024-10-17 DIAGNOSIS — M79605 Pain in left leg: Secondary | ICD-10-CM | POA: Diagnosis not present

## 2024-10-17 MED ORDER — METHOCARBAMOL 1000 MG/10ML IJ SOLN
200.0000 mg | Freq: Once | INTRAMUSCULAR | Status: AC
  Administered 2024-10-17: 1000 mg via INTRAMUSCULAR
  Filled 2024-10-17: qty 10

## 2024-10-17 MED ORDER — PREDNISONE 20 MG PO TABS: ORAL_TABLET | ORAL | 0 refills | Status: AC

## 2024-10-17 NOTE — Progress Notes (Signed)
 PROVIDER NOTE: Interpretation of information contained herein should be left to medically-trained personnel. Specific patient instructions are provided elsewhere under Patient Instructions section of medical record. This document was created in part using AI and STT-dictation technology, any transcriptional errors that may result from this process are unintentional.  Patient: Dominique Dyer  Service: E/M   PCP: Eliverto Bette Hover, MD  DOB: September 09, 1981  DOS: 10/17/2024  Provider: Eric DELENA Como, MD  MRN: [id number]  Delivery: Face-to-face  Specialty: Interventional Pain Management  Type: Established Patient  Setting: Ambulatory outpatient facility  Specialty designation: 09  Referring Prov.: Eliverto Bette Hover, MD  Location: Outpatient office facility       History of present illness (HPI) Dominique Dyer, a 44 y.o. year old female, is here today because of her Chronic bilateral low back pain without sciatica [M54.50, G89.29]. Ms. Yeary primary complain today is Back Pain (Lower bilateral ) and Leg Pain (L>R)  Pertinent problems: Ms. 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); Intractable chronic migraine without aura and without status migrainosus; Rib pain; Chronic pain syndrome; Myofascial pain; Lumbar radiculopathy; Spondylolisthesis of lumbar region; Lumbar spondylosis; Myofascial pain syndrome of lumbar spine; Chronic low back pain (1ry area of Pain) (Bilateral) (L>R) w/o sciatica; Chronic lower extremity pain (2ry area of Pain) (Left); Chronic hip pain (Left); Chronic sacroiliac joint pain (Left); Lumbar facet joint pain (Bilateral); Lumbar facet joint syndrome; Abnormal NCS (nerve conduction studies) (UE); Chronic neck pain; Cervical facet joint pain (Bilateral) (L>R); Cervical spine crepitus; Cervicogenic headache (Bilateral) (L>R); Spondylosis without myelopathy or radiculopathy, cervical region; Spondylosis without myelopathy or  radiculopathy, lumbar region; Osteoarthritis of sacroiliac joints (HCC) (Bilateral); Lumbosacral facet hypertrophy (Bilateral) (L5-S1); Cervical facet hypertrophy (Left: C2-3); Cervicalgia; Neck pain of over 3 months duration; Grade 1 (5 mm) Anterolisthesis of L4/L5 (stable); Low back pain of over 3 months duration; Low back pain radiating to left leg; Intractable low back pain; Multifactorial low back pain; Lumbar facet hypertrophy (Bilateral) (L4-5, L5-S1); and Acute exacerbation of chronic low back pain on their pertinent problem list.  Pain Assessment: Severity of Chronic pain is reported as a 9 /10. Location: Back Lower, Right, Left/Radaites into hips bilateral into outer legs bilateral into knee bilateral (L>R). Onset: More than a month ago. Quality: Burning, Sharp, Shooting, Aching, Constant. Timing: Constant. Modifying factor(s): Previous procedures, TENS unit while using only, and heating back. Vitals:  height is 5' 4 (1.626 m) and weight is 202 lb (91.6 kg). Her temporal temperature is 97.2 F (36.2 C) (abnormal). Her blood pressure is 115/80 and her pulse is 90. Her respiration is 16 and oxygen saturation is 97%.  BMI: Estimated body mass index is 34.67 kg/m as calculated from the following:   Height as of this encounter: 5' 4 (1.626 m).   Weight as of this encounter: 202 lb (91.6 kg).  Last encounter: 09/05/2024. Last procedure: 07/26/2024.  Reason for encounter: evaluation of worsening, or previously known (established) problem.   Discussed the use of AI scribe software for clinical note transcription with the patient, who gave verbal consent to proceed.  History of Present Illness   Dominique Dyer is a 44 year old female who presents with lower back pain and recent falls.  She reports bilateral lower back pain that has worsened after two falls yesterday, with increased stiffness and soreness. Pain is in the lower back and radiates down the left leg to the knee, without going past  the kneecap. The left leg  is more painful since the falls. She has not had x-rays or other imaging after these falls. She is taking over-the-counter ibuprofen  and 650 mg Tylenol  Arthritis for pain control.       Pharmacotherapy Assessment   Opioid Analgesic: None MME/day: 0 mg/day   Monitoring: [place] PMP: PDMP reviewed during this encounter.       Pharmacotherapy: No side-effects or adverse reactions reported. Compliance: No problems identified. Effectiveness: Clinically acceptable.  Bonner Norris, RN  10/17/2024  3:10 PM  Sign when Signing Visit Safety precautions to be maintained throughout the outpatient stay will include: orient to surroundings, keep bed in low position, maintain call bell within reach at all times, provide assistance with transfer out of bed and ambulation.     UDS:  Summary  Date Value Ref Range Status  05/07/2024 FINAL  Final    Comment:    ==================================================================== Compliance Drug Analysis, Ur ==================================================================== Test                             Result       Flag       Units  Drug Present and Declared for Prescription Verification   Cyclobenzaprine                 PRESENT      EXPECTED   Desmethylcyclobenzaprine       PRESENT      EXPECTED    Desmethylcyclobenzaprine is an expected metabolite of    cyclobenzaprine .    Diclofenac                     PRESENT      EXPECTED  Drug Present not Declared for Prescription Verification   Alprazolam                      166          UNEXPECTED ng/mg creat   Alpha-hydroxyalprazolam        389          UNEXPECTED ng/mg creat    Source of alprazolam  is a scheduled prescription medication. Alpha-    hydroxyalprazolam is an expected metabolite of alprazolam .    Carboxy-THC                    >1000        UNEXPECTED ng/mg creat    Carboxy-THC is a metabolite of tetrahydrocannabinol (THC). Source of    THC is most commonly herbal  marijuana or marijuana-based products,    but THC is also present in a scheduled prescription medication.    Trace amounts of THC can be present in hemp and cannabidiol (CBD)    products. This test is not intended to distinguish between delta-9-    tetrahydrocannabinol, the predominant form of THC in most herbal or    marijuana-based products, and delta-8-tetrahydrocannabinol.    Acetaminophen                   PRESENT      UNEXPECTED ==================================================================== Test                      Result    Flag   Units      Ref Range   Creatinine              100              mg/dL      >=  20 ==================================================================== Declared Medications:  The flagging and interpretation on this report are based on the  following declared medications.  Unexpected results may arise from  inaccuracies in the declared medications.   **Note: The testing scope of this panel includes these medications:   Cyclobenzaprine  (Flexeril )   **Note: The testing scope of this panel does not include small to  moderate amounts of these reported medications:   Diclofenac (Voltaren)   **Note: The testing scope of this panel does not include the  following reported medications:   Pantoprazole  (Protonix )  Rizatriptan (Maxalt) ==================================================================== For clinical consultation, please call [phone]. ====================================================================     No results found for: CBDTHCR No results found for: D8THCCBX No results found for: D9THCCBX  ROS  Constitutional: Denies any fever or chills Gastrointestinal: No reported hemesis, hematochezia, vomiting, or acute GI distress Musculoskeletal: Denies any acute onset joint swelling, redness, loss of ROM, or weakness Neurological: No reported episodes of acute onset apraxia, aphasia, dysarthria, agnosia, amnesia,  paralysis, loss of coordination, or loss of consciousness  Medication Review  ALPRAZolam , acetaminophen , cyclobenzaprine , diclofenac, magnesium gluconate, pantoprazole , predniSONE , and rizatriptan  History Review  Allergy: Ms. Tomassetti is allergic to aspirin , naproxen , sumatriptan succinate, and lactose intolerance (gi). Drug: Ms. Tun  reports current drug use. Drug: Marijuana. Alcohol:  reports no history of alcohol use. Tobacco:  reports that she quit smoking about 6 months ago. Her smoking use included cigars. She has never used smokeless tobacco. Social: Ms. Stander  reports that she quit smoking about 6 months ago. Her smoking use included cigars. She has never used smokeless tobacco. She reports current drug use. Drug: Marijuana. She reports that she does not drink alcohol. Medical:  has a past medical history of Allergy, Anemia (2019), Anginal pain (2019), Anxiety, Arthritis, Bipolar disorder (HCC), Depression, Dyspnea, Dysrhythmia (2019), GERD (gastroesophageal reflux disease), Herniated lumbar intervertebral disc, Hypertension (2019), Migraines, Panic attacks, and Pica (2019). Surgical: Ms. Nickle  has a past surgical history that includes Cesarean section; Colposcopy (2006); Cesarean section (Bilateral, 04/14/2018); and Tubal ligation (2019). Family: family history includes Anxiety disorder in her maternal grandmother and mother; Autism in her son; Bipolar disorder in her maternal grandmother; Cancer in her maternal grandfather and paternal uncle; Diabetes in her mother; Diabetic kidney disease in her paternal uncle; Heart attack in her paternal grandfather; Heart attack (age of onset: 79) in her mother; OCD in her maternal grandmother; Schizophrenia in her maternal grandmother; Seizures in her father; Thyroid  disease in her paternal grandmother.  Laboratory Chemistry Profile   Renal Lab Results  Component Value Date   BUN 13 07/02/2024   CREATININE 0.72 07/02/2024   BCR 16  05/07/2024   GFRAA >60 04/13/2018   GFRNONAA >60 07/02/2024    Hepatic Lab Results  Component Value Date   AST 39 05/07/2024   ALT 20 01/19/2024   ALBUMIN 4.4 05/07/2024   ALKPHOS 72 05/07/2024   LIPASE 32 07/02/2024    Electrolytes Lab Results  Component Value Date   NA 139 07/02/2024   K 4.1 07/02/2024   CL 103 07/02/2024   CALCIUM 9.5 07/02/2024   MG 2.1 05/07/2024    Bone Lab Results  Component Value Date   25OHVITD1 41 05/07/2024   25OHVITD2 36 05/07/2024   25OHVITD3 5.3 05/07/2024    Inflammation (CRP: Acute Phase) (ESR: Chronic Phase) Lab Results  Component Value Date   CRP 15 (H) 05/07/2024   ESRSEDRATE 25 05/07/2024         Note: Above Lab results  reviewed.  Recent Imaging Review  DG PAIN CLINIC C-ARM 1-60 MIN NO REPORT Fluoro was used, but no Radiologist interpretation will be provided.  Please refer to NOTES tab for provider progress note. Note: Reviewed        Physical Exam  Vitals: BP 115/80 (Patient Position: Sitting, Cuff Size: Normal)   Pulse 90   Temp (!) 97.2 F (36.2 C) (Temporal)   Resp 16   Ht 5' 4 (1.626 m)   Wt 202 lb (91.6 kg)   SpO2 97%   BMI 34.67 kg/m  BMI: Estimated body mass index is 34.67 kg/m as calculated from the following:   Height as of this encounter: 5' 4 (1.626 m).   Weight as of this encounter: 202 lb (91.6 kg). Ideal: Ideal body weight: 54.7 kg (120 lb 9.5 oz) Adjusted ideal body weight: 69.5 kg (153 lb 2.5 oz) General appearance: Well nourished, well developed, and well hydrated. In no apparent acute distress Mental status: Alert, oriented x 3 (person, place, & time)       Respiratory: No evidence of acute respiratory distress Eyes: PERLA   Assessment   Diagnosis Status  1. Chronic low back pain (1ry area of Pain) (Bilateral) (L>R) w/o sciatica   2. Chronic lower extremity pain (2ry area of Pain) (Left)   3. Lumbar facet hypertrophy (Bilateral) (L4-5, L5-S1)   4. Low back pain of over 3 months  duration   5. Low back pain radiating to left leg   6. Lumbosacral facet hypertrophy (Bilateral) (L5-S1)   7. Acute exacerbation of chronic low back pain    Controlled Controlled Controlled   Updated Problems: Problem  Acute Exacerbation of Chronic Low Back Pain    Plan of Care  Problem-specific:  Assessment and Plan    Chronic low back pain with left leg pain   Chronic low back pain has worsened following recent falls, leading to increased stiffness and soreness in the lower back, with left leg pain extending to the knee. No imaging was performed as the falls were not severe. Pain has worsened, suggesting possible inflammation. Current management includes over-the-counter ibuprofen  and Tylenol  Arthritis. An epidural injection in the lower back is scheduled to address pain. A steroid taper is prescribed to reduce inflammation, and an intramuscular steroid injection was administered today.       Ms. DEREONA KOLODNY has a current medication list which includes the following long-term medication(s): pantoprazole  and rizatriptan.  Pharmacotherapy (Medications Ordered): Meds ordered this encounter  Medications   predniSONE  (DELTASONE ) 20 MG tablet    Sig: Take 3 tablets (60 mg total) by mouth daily with breakfast for 3 days, THEN 2 tablets (40 mg total) daily with breakfast for 3 days, THEN 1 tablet (20 mg total) daily with breakfast for 3 days.    Dispense:  18 tablet    Refill:  0   methocarbamol  (ROBAXIN ) injection 200 mg   Orders:  Orders Placed This Encounter  Procedures   Lumbar Epidural Injection    Standing Status:   Future    Expiration Date:   01/15/2025    Scheduling Instructions:     Procedure: Interlaminar Lumbar Epidural Steroid injection (LESI)  L5-S1     Laterality: Left-sided     Procedural Analgesia/Anxiolysis: Patient's choice     Timeframe: As soon as schedule allows.    Where will this procedure be performed?:   [facility]   Informed Consent  Details: Physician/Practitioner Attestation; Transcribe to consent form and obtain patient  signature    Do not administer NSAIDs (Toradol , etc.) if patient has an allergy or intolerance to NSAIDs or if patient has CKD (chronic kidney disease or failure). Avoid the use of Muscle Relaxants (orphenedrine/Norflex, etc.) if patient has allergy or intolerance to this medication.    Scheduling Instructions:     Nursing orders: Complete pain questionnaire before administration of therapy. Document location, laterality, onset, level, trigger, and current medications taken for the pain.    Physician/Practitioner attestation of informed consent for procedure/surgical case:   I, the physician/practitioner, attest that I have discussed with the patient the benefits, risks, side effects, alternatives, likelihood of achieving goals and potential problems during recovery for the procedure that I have provided informed consent.    Procedure:   IM injection of therapeutic substance    Physician/Practitioner performing the procedure:   Uzma Hellmer A. Tanya, MD    Indication/Reason:   Acute on chronic pain     Interventional Therapies  Risk Factors  Considerations  Medical Comorbidities:  (05/07/2024) UDS (+) Carboxy-THC (Marijuana)  GERD  HTN  tobacco dependence  Hx. Vasovagal Response to MNB/TPI by Dr. Maree     Planned  Pending:   Diagnostic Lumbar MRI  Therapeutic right L5-S1 LESI #2  Diagnostic bilateral lumbar facet (L3-4, L4-5, and L5-S1) MBB #1    Under consideration:   Therapeutic right L5-S1 LESI #2  Diagnostic bilateral lumbar facet MBB #1 with possible RFA follow-up Diagnostic bilateral sacroiliac Blk #1 with possible RFA follow-up  Diagnostic bilateral cervical facet MBB #1 with possible RFA follow-up    Completed: (Analgesic benefit)1  Therapeutic right L5-S1 LESI x1 (07/26/2024) (100/80/50/50)    Therapeutic  Palliative (PRN) options:   None established   Completed by other  providers:   Procedures done at [facility] & Dr. Maree [facility])  Diagnostic lumbar MRI (07/2023) apparently done at [facility] with no available report in our system Therapeutic MNB/TPI x1 (03/28/2024) by Dr. Jannett Maree [facility]) (Vasovagal RXN)  Therapeutic right CTS inj. x2 (12/10/2019, 01/21/2020) by Dr. Jannett Maree [facility])  Diagnostic bilateral (UE) EMG (11/19/2019) by Dr. Jannett Maree [facility]) Dx: (R) Grade 1 CTS. Therapeutic occipital NB + TPI x1 (04/18/2019) by Dr. Jannett Maree [facility])  Therapeutic sphenopalatine (V2 trigeminal NB) x2 (03/30/2017, 04/06/2017) by Dr. Jannett Maree [facility])  Therapeutic left L4/L5 TFESI x1 (December 2024) by unknown provider.  [place])  Therapeutic bilateral L4/L5 TFESI x2 (10/26/2023, 01/04/2024) by unknown provider.  ([facility])  Therapeutic L5-S1 LESI x1 (01/04/2024) by unknown provider.  ([facility])  Diagnostic left L4-5, L5-S1 lumbar facet BLK x2 (02/17/2024, 03/14/2024) by unknown provider.  ([facility])  Therapeutic left L4-5, L5-S1 lumbar facet MB RFA x1 (04/11/2024) by unknown provider.  ([facility])   1(Analgesic benefit): Expressed in percentage (%). (Local anesthetic[LA] +/- sedation  L.A.Local Anesthetic  Steroid benefit  Ongoing benefit)     Return for [facility]): (L) L5-S1 LESI #2.    Recent Visits Date Type Provider Dept  09/05/24 Office Visit Tanya Glisson, MD [facility]  07/26/24 Procedure visit Tanya Glisson, MD [facility]  Showing recent visits within past 90 days and meeting all other requirements Today's Visits Date Type Provider Dept  10/17/24 Office Visit Tanya Glisson, MD [facility]  Showing today's visits and meeting all other requirements Future Appointments No visits were found meeting these conditions. Showing future appointments within next 90 days and meeting all other requirements  I discussed the assessment and treatment plan with the  patient. The  patient was provided an opportunity to ask questions and all were answered. The patient agreed with the plan and demonstrated an understanding of the instructions.  Patient advised to call back or seek an in-person evaluation if the symptoms or condition worsens.  Duration of encounter: 32 minutes.  Total time on encounter, as per AMA guidelines included both the face-to-face and non-face-to-face time personally spent by the physician and/or other qualified health care professional(s) on the day of the encounter (includes time in activities that require the physician or other qualified health care professional and does not include time in activities normally performed by clinical staff). Physician's time may include the following activities when performed: Preparing to see the patient (e.g., pre-charting review of records, searching for previously ordered imaging, lab work, and nerve conduction tests) Review of prior analgesic pharmacotherapies. Reviewing PMP Interpreting ordered tests (e.g., lab work, imaging, nerve conduction tests) Performing post-procedure evaluations, including interpretation of diagnostic procedures Obtaining and/or reviewing separately obtained history Performing a medically appropriate examination and/or evaluation Counseling and educating the patient/family/caregiver Ordering medications, tests, or procedures Referring and communicating with other health care professionals (when not separately reported) Documenting clinical information in the electronic or other health record Independently interpreting results (not separately reported) and communicating results to the patient/ family/caregiver Care coordination (not separately reported)  Note by: Eric DELENA Como, MD (TTS and AI technology used. I apologize for any typographical errors that were not detected and corrected.) Date: 10/17/2024; Time: 5:41 PM

## 2024-10-17 NOTE — Progress Notes (Signed)
 Safety precautions to be maintained throughout the outpatient stay will include: orient to surroundings, keep bed in low position, maintain call bell within reach at all times, provide assistance with transfer out of bed and ambulation.

## 2024-10-17 NOTE — Patient Instructions (Signed)

## 2024-10-25 ENCOUNTER — Encounter: Payer: Self-pay | Admitting: Pain Medicine
